# Patient Record
Sex: Male | Born: 1935 | Race: Black or African American | Hispanic: No | Marital: Married | Smoking: Never smoker
Health system: Southern US, Community
[De-identification: ages and names within clinical notes are randomized; demographics above are authoritative.]

## PROBLEM LIST (undated history)

## (undated) DIAGNOSIS — I10 Essential (primary) hypertension: Secondary | ICD-10-CM

## (undated) NOTE — *Deleted (*Deleted)
PMR Admission Coordinator Pre-Admission Assessment   Patient: Tyler Glass is an 70 y.o., male MRN: [id number] DOB: [birth year] Height: 6\' 1"  (185.4 cm) Weight: 83.9 kg                                                                                                                                                  Insurance Information HMO: yes    PPO:      PCP:      IPA:      80/20:      OTHER:  PRIMARY: [employer]      Policy#: [id number]      Subscriber: pt CM Name: ***      Phone#: [phone] ext ***     Fax#: [phone] Pre-Cert#: ***      Employer:  Benefits:  Phone #: [phone]     Name:  Dolores Hoose. Date: ***     Deduct: ***      Out of Pocket Max: ***      Life Max: ***  CIR: ***      SNF: *** Outpatient: ***     Co-Pay: *** Home Health: ***      Co-Pay: *** DME: ***     Co-Pay: *** Providers: *** SECONDARY:       Policy#:       Phone#:    Financial Counselor:       Phone#:    The "Data Collection Information Summary" for patients in Inpatient Rehabilitation Facilities with attached "Privacy Act Statement-Health Care Records" was provided and verbally reviewed with: Patient and Family   Emergency Contact Information         Contact Information     Name Relation Home Work Mobile    [name] Daughter     [phone]       Current Medical History  Patient Admitting Diagnosis: L lateral medullary CVA (Wallenberg syndrome)   History of Present Illness: Tyler Glass is a 89 y.o. right-handed male with history of hypertension.  He [facility] to [facility]2020 with right hemiparesis and dysarthria. Cranial CT unremarkable for acute intracranial process. Patient did not receive TPA.  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 Transfer to [facility] for further workup.  MRA angiography does not show significant carotid bifurcation disease.  Admission chemistries glucose 172, creatinine 1.44, alcohol negative,  urine drug screen negative, hemoglobin A1c 6.0.  Echocardiogram with EF of 60-65%, no wall motion abnormalities grade 1 diastolic dysfunction.  Currently maintained on aspirin and Plavix for CVA prophylaxis x3 weeks then aspirin alone.  Subcutaneous Lovenox for DVT prophylaxis.  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  Started on dysphagia #3 nectar thick liquids.  Therapy evaluations completed with recommendations of CIR.   Complete NIHSS TOTAL: 4   Past Medical History  Past Medical History:  Diagnosis Date  . Hypertension        Family History  family history is not on file.   Prior Rehab/Hospitalizations:  Has the patient had prior rehab or hospitalizations prior to admission? No; presented to [facility] hospital in [facility] prior to [facility], but does not appear to have been admitted.    Has the patient had major surgery during 100 days prior to admission? No   Current Medications    Current Facility-Administered Medications:  .  acetaminophen (TYLENOL) tablet 650 mg, 650 mg, Oral, Q4H PRN **OR** acetaminophen (TYLENOL) 160 MG/5ML solution 650 mg, 650 mg, Per Tube, Q4H PRN **OR** acetaminophen (TYLENOL) suppository 650 mg, 650 mg, Rectal, Q4H PRN, Lewie Chamber, MD .  aspirin EC tablet 81 mg, 81 mg, Oral, Daily, [name], [name], NP, 81 mg at 04/30/20 0915 .  atorvastatin (LIPITOR) tablet 80 mg, 80 mg, Oral, Daily, [name], MD, 80 mg at 04/30/20 0913 .  chlorproMAZINE (THORAZINE) tablet 12.5 mg, 12.5 mg, Oral, TID, [name], [name], NP, 12.5 mg at 04/30/20 0914 .  clopidogrel (PLAVIX) tablet 75 mg, 75 mg, Oral, Daily, Erick Blinks, MD, 75 mg at 04/30/20 0914 .  enoxaparin (LOVENOX) injection 40 mg, 40 mg, Subcutaneous, Daily, Lewie Chamber, MD, 40 mg at 04/30/20 0913 .  hydrALAZINE (APRESOLINE) injection 10 mg, 10 mg, Intravenous, Q4H PRN, Lewie Chamber, MD .  Melene Muller ON 05/01/2020] influenza vaccine adjuvanted (FLUAD) injection 0.5 mL, 0.5 mL, Intramuscular,  Tomorrow-1000, Singh, Prashant K, MD .  labetalol (NORMODYNE) injection 10 mg, 10 mg, Intravenous, Q4H PRN, Lewie Chamber, MD .  lactated ringers infusion, , Intravenous, Continuous, [name], MD, Last Rate: 100 mL/hr at 04/30/20 0930, New Bag at 04/30/20 0930 .  MEDLINE mouth rinse, 15 mL, Mouth Rinse, BID, [name], MD, 15 mL at 04/30/20 0931 .  pantoprazole (PROTONIX) EC tablet 40 mg, 40 mg, Oral, Daily, [name], MD, 40 mg at 04/30/20 0914 .  Resource ThickenUp Clear, , Oral, PRN, [name], MD   Patients Current Diet:     Diet Order                      DIET DYS 3 Room service appropriate? Yes; Fluid consistency: Nectar Thick  Diet effective now                      Precautions / Restrictions Precautions Precautions: Fall Precaution Comments: pt denies falling prior to or since CVA Restrictions Weight Bearing Restrictions: No    Has the patient had 2 or more falls or a fall with injury in the past year?No   Prior Activity Level Community (5-7x/wk): fully independent, driving, working in the yard, no DME used prior to admission (lived independently in [place])   Prior Functional Level Prior Function Level of Independence: Independent Comments: lived alone in [place]; driving, grocery shopping, yard work; daughter brought him to [facility] after discharged by [place]   Self Care: Did the patient need help bathing, dressing, using the toilet or eating?  Independent   Indoor Mobility: Did the patient need assistance with walking from room to room (with or without device)? Independent   Stairs: Did the patient need assistance with internal or external stairs (with or without device)? 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 None of the above   Current Functional Level Cognition   Overall Cognitive Status: Within Functional Limits for tasks assessed Orientation Level: Oriented to person, Oriented to place, Oriented to time, Disoriented to time General Comments: o x4    Extremity Assessment (includes Sensation/Coordination)   Upper Extremity Assessment: RUE deficits/detail RUE Deficits / Details: grossly 3/5 strength, movements very ataxic from shoulder RUE Sensation: decreased light touch, decreased proprioception RUE Coordination: decreased fine motor, decreased gross motor  Lower Extremity Assessment: RLE deficits/detail RLE Deficits / Details: limited assessment with lesser ataxia than RUE noted; strength grossly 4/5 RLE Sensation:  (stated RLE some numbness (?accuracy)) RLE Coordination: decreased gross motor     ADLs   Overall ADL's : Needs assistance/impaired Eating/Feeding: Sitting, Moderate assistance Eating/Feeding Details (indicate cue type and reason): will attempt modified utensils - balancing weight with increased handles Grooming: Moderate assistance, Sitting Grooming Details (indicate cue type and reason): RUE (dominant) ataxic and overall weak impacting ability to interact and use functional ADL tools - during oral care wash cloth used to build up handle of toothbrush and assist to guide to mouth Upper Body Bathing: Moderate assistance Lower Body Bathing: Moderate assistance Upper Body Dressing : Moderate assistance Lower Body Dressing: Moderate assistance, Maximal assistance, Sit to/from stand Toilet Transfer: Maximal assistance, Tax adviser Details (indicate cue type and reason): face to face transfer using gait belt, asisst for body position in space and boost to turn Toileting- Clothing Manipulation and Hygiene: Maximal assistance Functional mobility during ADLs: Moderate assistance, Maximal assistance (SPT  only this session, for true ambulation will require +2) General ADL Comments: Pt very motivated and eager for therapy     Mobility   Overal bed mobility: Needs Assistance Bed Mobility: Rolling, Sidelying to Sit Rolling: Supervision Sidelying to sit: Mod assist Sit to sidelying: Min assist General bed mobility comments: coming up from rt side required incr assist to achieve midline (leaning rt)     Transfers   Overall transfer level: Needs assistance Equipment used: 1 person hand held assist Transfers: Sit to/from Stand, Stand Pivot Transfers Sit to Stand: Mod assist Stand pivot transfers: Max assist  Lateral/Scoot Transfers: Min assist General transfer comment: use of gait belt and verbal cues for lean and face to face mod to max boost to stand, using "dancing/rocking" to pivot to recliner     Ambulation / Gait / Stairs / Wheelchair Mobility   Ambulation/Gait General Gait Details: unable; will need +2 assist     Posture / Balance Dynamic Sitting Balance Sitting balance - Comments: rt lean; with time able to maintain midline with LUE support Balance Overall balance assessment: Needs assistance Sitting-balance support: Bilateral upper extremity supported, Feet supported Sitting balance-Leahy Scale: Poor Sitting balance - Comments: rt lean; with time able to maintain midline with LUE support Postural control: Right lateral lean Standing balance support: No upper extremity supported Standing balance-Leahy Scale: Zero Standing balance comment: rt lean     Special needs/care consideration Designated visitor daughter, Eulas Post        Previous Home Environment (from acute therapy documentation) Living Arrangements: Children (with daughter (she brought him from [facility])) Available Help at Discharge: Family (daughter) Type of Home: House Home Layout: Two level, 1/2 bath on main level (pt stayed on 1st floor ) Alternate Level Stairs-Number of Steps: flight Home Access: Stairs to enter  Entrance Stairs-Number of Steps: 1 (  threshold) Bathroom Shower/Tub: Other (comment) (upstairs in dtr's home; pt unsure type) Home Care Services: No   Discharge Living Setting Plans for Discharge Living Setting: Lives with (comment) (daughter Eulas Post (temporarily since CVA)) Type of Home at Discharge: House Discharge Home Layout: Two level, 1/2 bath on main level (pt has been staying on first floor) Alternate Level Stairs-Number of Steps: full flight Discharge Home Access: Stairs to enter Entrance Stairs-Rails: None Entrance Stairs-Number of Steps: 1 Discharge Bathroom Shower/Tub: Tub/shower unit Discharge Bathroom Toilet: Standard Discharge Bathroom Accessibility: Yes How Accessible: Accessible via walker Does the patient have any problems obtaining your medications?: No   Social/Family/Support Systems Patient Roles: Parent Anticipated Caregiver: Microbiologist Information: [phone] Ability/Limitations of 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  Felt he was having stroke like symptoms, so went to the [place] hospital in [facility] where his daughter met him.  He was discharged from the [facility] hospital with symptoms seemingly worsening.  Returned to [facility] with Aliya and went to [facility] and was transferred to [facility].  Pt/Family Agrees to Admission and willing to participate: Yes Program Orientation Provided & Reviewed with Pt/Caregiver Including Roles  & Responsibilities: Yes  Barriers to Discharge: Insurance for SNF coverage     Decrease burden of Care through IP rehab admission:  n/a   Possible need for SNF placement upon discharge: Not anticipated.  Good family support.    Patient Condition: This patient's condition remains as documented in the consult dated 04/29/2020, in which the Rehabilitation Physician determined and documented that the patient's condition is appropriate for intensive rehabilitative care in an inpatient rehabilitation facility. Will admit to inpatient rehab pending insurance authorization ***.   Preadmission Screen Completed By:  Stephania Fragmin, PT, DPT 04/30/2020 12:25 PM ______________________________________________________________________

---

## 2020-04-27 ENCOUNTER — Emergency Department (HOSPITAL_BASED_OUTPATIENT_CLINIC_OR_DEPARTMENT_OTHER): Payer: Medicare HMO

## 2020-04-27 ENCOUNTER — Other Ambulatory Visit: Payer: Self-pay

## 2020-04-27 ENCOUNTER — Encounter (HOSPITAL_BASED_OUTPATIENT_CLINIC_OR_DEPARTMENT_OTHER): Payer: Self-pay

## 2020-04-27 ENCOUNTER — Inpatient Hospital Stay (HOSPITAL_BASED_OUTPATIENT_CLINIC_OR_DEPARTMENT_OTHER)
Admission: EM | Admit: 2020-04-27 | Discharge: 2020-05-01 | DRG: 065 | Disposition: A | Discharge status: Rehabilitation Facility | Payer: Medicare HMO | Attending: Internal Medicine | Admitting: Internal Medicine

## 2020-04-27 DIAGNOSIS — I69391 Dysphagia following cerebral infarction: Secondary | ICD-10-CM

## 2020-04-27 DIAGNOSIS — I10 Essential (primary) hypertension: Secondary | ICD-10-CM

## 2020-04-27 DIAGNOSIS — N1831 Chronic kidney disease, stage 3a: Secondary | ICD-10-CM | POA: Diagnosis present

## 2020-04-27 DIAGNOSIS — R270 Ataxia, unspecified: Principal | ICD-10-CM | POA: Diagnosis present

## 2020-04-27 DIAGNOSIS — E538 Deficiency of other specified B group vitamins: Secondary | ICD-10-CM | POA: Diagnosis present

## 2020-04-27 DIAGNOSIS — G8191 Hemiplegia, unspecified affecting right dominant side: Secondary | ICD-10-CM | POA: Diagnosis present

## 2020-04-27 DIAGNOSIS — Z79899 Other long term (current) drug therapy: Secondary | ICD-10-CM

## 2020-04-27 DIAGNOSIS — R066 Hiccough: Secondary | ICD-10-CM | POA: Diagnosis present

## 2020-04-27 DIAGNOSIS — I639 Cerebral infarction, unspecified: Secondary | ICD-10-CM

## 2020-04-27 DIAGNOSIS — R29703 NIHSS score 3: Secondary | ICD-10-CM | POA: Diagnosis present

## 2020-04-27 DIAGNOSIS — E7849 Other hyperlipidemia: Secondary | ICD-10-CM | POA: Diagnosis present

## 2020-04-27 DIAGNOSIS — R29810 Facial weakness: Secondary | ICD-10-CM | POA: Diagnosis present

## 2020-04-27 DIAGNOSIS — R278 Other lack of coordination: Secondary | ICD-10-CM | POA: Diagnosis present

## 2020-04-27 DIAGNOSIS — I129 Hypertensive chronic kidney disease with stage 1 through stage 4 chronic kidney disease, or unspecified chronic kidney disease: Secondary | ICD-10-CM | POA: Diagnosis present

## 2020-04-27 DIAGNOSIS — I6389 Other cerebral infarction: Secondary | ICD-10-CM | POA: Diagnosis not present

## 2020-04-27 DIAGNOSIS — Z7289 Other problems related to lifestyle: Secondary | ICD-10-CM

## 2020-04-27 DIAGNOSIS — R4701 Aphasia: Secondary | ICD-10-CM | POA: Diagnosis present

## 2020-04-27 DIAGNOSIS — Z20822 Contact with and (suspected) exposure to covid-19: Secondary | ICD-10-CM | POA: Diagnosis present

## 2020-04-27 DIAGNOSIS — R7303 Prediabetes: Secondary | ICD-10-CM | POA: Diagnosis present

## 2020-04-27 DIAGNOSIS — N179 Acute kidney failure, unspecified: Secondary | ICD-10-CM | POA: Diagnosis present

## 2020-04-27 DIAGNOSIS — R1312 Dysphagia, oropharyngeal phase: Secondary | ICD-10-CM | POA: Diagnosis present

## 2020-04-27 DIAGNOSIS — R471 Dysarthria and anarthria: Secondary | ICD-10-CM | POA: Diagnosis present

## 2020-04-27 HISTORY — DX: Essential (primary) hypertension: I10

## 2020-04-27 LAB — ETHANOL: Alcohol, Ethyl (B): <10 mg/dL (ref <10)

## 2020-04-27 LAB — RAPID URINE DRUG SCREEN, HOSP PERFORMED
Amphetamines: NOT DETECTED
Barbiturates: NOT DETECTED
Benzodiazepines: NOT DETECTED
Cocaine: NOT DETECTED
Opiates: NOT DETECTED
Tetrahydrocannabinol: NOT DETECTED

## 2020-04-27 LAB — CBC
HCT: 43.6 % (ref 39.0–52.0)
Hemoglobin: 14.2 g/dL (ref 13.0–17.0)
MCH: 28.7 pg (ref 26.0–34.0)
MCHC: 32.6 g/dL (ref 30.0–36.0)
MCV: 88.1 fL (ref 80.0–100.0)
Platelets: 312 10*3/uL (ref 150–400)
RBC: 4.95 MIL/uL (ref 4.22–5.81)
RDW: 14 % (ref 11.5–15.5)
WBC: 10 10*3/uL (ref 4.0–10.5)
nRBC: 0 % (ref 0.0–0.2)

## 2020-04-27 LAB — DIFFERENTIAL
Abs Immature Granulocytes: 0.04 10*3/uL (ref 0.00–0.07)
Basophils Absolute: 0 10*3/uL (ref 0.0–0.1)
Basophils Relative: 0 %
Eosinophils Absolute: 0.1 10*3/uL (ref 0.0–0.5)
Eosinophils Relative: 1 %
Immature Granulocytes: 0 %
Lymphocytes Relative: 21 %
Lymphs Abs: 2.1 10*3/uL (ref 0.7–4.0)
Monocytes Absolute: 0.7 10*3/uL (ref 0.1–1.0)
Monocytes Relative: 7 %
Neutro Abs: 6.9 10*3/uL (ref 1.7–7.7)
Neutrophils Relative %: 71 %

## 2020-04-27 LAB — COMPREHENSIVE METABOLIC PANEL
ALT: 22 U/L (ref 0–44)
AST: 34 U/L (ref 15–41)
Albumin: 4 g/dL (ref 3.5–5.0)
Alkaline Phosphatase: 62 U/L (ref 38–126)
Anion gap: 12 (ref 5–15)
BUN: 15 mg/dL (ref 8–23)
CO2: 28 mmol/L (ref 22–32)
Calcium: 9.7 mg/dL (ref 8.9–10.3)
Chloride: 99 mmol/L (ref 98–111)
Creatinine, Ser: 1.44 mg/dL — ABNORMAL HIGH (ref 0.61–1.24)
GFR, Estimated: 48 mL/min — ABNORMAL LOW (ref >60)
Glucose, Bld: 172 mg/dL — ABNORMAL HIGH (ref 70–99)
Potassium: 3.6 mmol/L (ref 3.5–5.1)
Sodium: 139 mmol/L (ref 135–145)
Total Bilirubin: 0.7 mg/dL (ref 0.3–1.2)
Total Protein: 7.5 g/dL (ref 6.5–8.1)

## 2020-04-27 LAB — URINALYSIS, ROUTINE W REFLEX MICROSCOPIC
Bilirubin Urine: NEGATIVE
Glucose, UA: NEGATIVE mg/dL
Hgb urine dipstick: NEGATIVE
Ketones, ur: NEGATIVE mg/dL
Leukocytes,Ua: NEGATIVE
Nitrite: NEGATIVE
Protein, ur: NEGATIVE mg/dL
Specific Gravity, Urine: 1.015 (ref 1.005–1.030)
pH: 7 (ref 5.0–8.0)

## 2020-04-27 LAB — APTT: aPTT: 28 seconds (ref 24–36)

## 2020-04-27 LAB — PROTIME-INR
INR: 1 (ref 0.8–1.2)
Prothrombin Time: 12.6 seconds (ref 11.4–15.2)

## 2020-04-27 LAB — RESPIRATORY PANEL BY RT PCR (FLU A&B, COVID)
Influenza A by PCR: NEGATIVE
Influenza B by PCR: NEGATIVE
SARS Coronavirus 2 by RT PCR: NEGATIVE

## 2020-04-27 NOTE — ED Triage Notes (Signed)
Pt states he went to bed 11/5 ~7pm- he woke 11/6 ~7am unable to walk and difficulty swallowing-pt lives in [place] and went to the [place] there ~8am on 11/6-states he was dx with "slight stroke"-wife states pt's daughter brought pt to area yesterday-pt states he feels same as when sx started 11/6-NAD-to triage in w/c-answering all ?s appropriately-wife with pt

## 2020-04-27 NOTE — ED Provider Notes (Signed)
[facility] EMERGENCY DEPARTMENT Provider Note   CSN: [id number] Arrival date & time: 04/27/20  1340     History Chief Complaint  Patient presents with  . Cerebrovascular Accident    Tyler Glass is a 86 y.o. male.  He has a history of hypertension and lives alone in [place].  3 days ago when he woke up he said he could not walk.  He went to the [place] there were they did a work-up and told him he had a slight stroke.  They sent him home even though he could not walk and family from [place] went and got him and brought him to [place] area.  He continues to have symptoms today.  Denies any headache blurry vision double vision numbness or weakness.  Does feel dizzy and unbalanced.  The history is provided by the patient.  Cerebrovascular Accident This is a new problem. The current episode started more than 2 days ago. The problem occurs constantly. The problem has not changed since onset.Pertinent negatives include no chest pain, no abdominal pain, no headaches and no shortness of breath. The symptoms are aggravated by standing. Nothing relieves the symptoms. He has tried nothing for the symptoms. The treatment provided no relief.       Past Medical History:  Diagnosis Date  . Hypertension     There are no problems to display for this patient.   History reviewed. No pertinent surgical history.     No family history on file.  Social History   Tobacco Use  . Smoking status: Never Smoker  . Smokeless tobacco: Never Used  Vaping Use  . Vaping Use: Never used  Substance Use Topics  . Alcohol use: Yes    Comment: occ  . Drug use: Not on file    Home Medications Prior to Admission medications   Not on File    Allergies    Patient has no known allergies.  Review of Systems   Review of Systems  Constitutional: Negative for fever.  HENT: Negative for sore throat.   Eyes: Negative for visual disturbance.  Respiratory: Negative for shortness of  breath.   Cardiovascular: Negative for chest pain.  Gastrointestinal: Negative for abdominal pain.  Genitourinary: Negative for dysuria.  Musculoskeletal: Positive for gait problem.  Skin: Negative for rash.  Neurological: Negative for headaches.    Physical Exam Updated Vital Signs BP (!) 144/88 (BP Location: Left Arm)   Pulse 98   Temp 98.5 F (36.9 C) (Oral)   Resp 20   Ht 6\' 1"  (1.854 m)   Wt 83.9 kg   SpO2 98%   BMI 24.41 kg/m   Physical Exam Vitals and nursing note reviewed.  Constitutional:      Appearance: Normal appearance. He is well-developed.  HENT:     Head: Normocephalic and atraumatic.  Eyes:     Conjunctiva/sclera: Conjunctivae normal.  Cardiovascular:     Rate and Rhythm: Normal rate and regular rhythm.     Pulses: Normal pulses.     Heart sounds: No murmur heard.   Pulmonary:     Effort: Pulmonary effort is normal. No respiratory distress.     Breath sounds: Normal breath sounds.  Abdominal:     Palpations: Abdomen is soft.     Tenderness: There is no abdominal tenderness.  Musculoskeletal:        General: No deformity. Normal range of motion.     Cervical back: Neck supple.     Right lower leg:  No edema.     Left lower leg: No edema.  Skin:    General: Skin is warm and dry.  Neurological:     General: No focal deficit present.     Mental Status: He is alert.     Cranial Nerves: Cranial nerve deficit present.     Sensory: No sensory deficit.     Motor: No weakness.     Gait: Gait abnormal.     Comments: Patient needed maximal assist to even stand at the side of the bed much less take a step.  Leaning towards the right.  Slight droop of right eyelid     ED Results / Procedures / Treatments   Labs (all labs ordered are listed, but only abnormal results are displayed) Labs Reviewed  COMPREHENSIVE METABOLIC PANEL - Abnormal; Notable for the following components:      Result Value   Glucose, Bld 172 (*)    Creatinine, Ser 1.44 (*)     GFR, Estimated 48 (*)    All other components within normal limits  RESPIRATORY PANEL BY RT PCR (FLU A&B, COVID)  ETHANOL  PROTIME-INR  APTT  CBC  DIFFERENTIAL  RAPID URINE DRUG SCREEN, HOSP PERFORMED  URINALYSIS, ROUTINE W REFLEX MICROSCOPIC    EKG EKG Interpretation  Date/Time:  Monday April 27 2020 14:07:04 EST Ventricular Rate:  95 PR Interval:    QRS Duration: 84 QT Interval:  342 QTC Calculation: 430 R Axis:   48 Text Interpretation: Sinus rhythm Borderline repolarization abnormality No old tracing to compare Confirmed by Meridee Score [phone]) on 04/27/2020 2:30:36 PM   Radiology CT HEAD WO CONTRAST  Result Date: 04/27/2020 CLINICAL DATA:  Neuro deficit, acute stroke suspected. EXAM: CT HEAD WITHOUT CONTRAST TECHNIQUE: Contiguous axial images were obtained from the base of the skull through the vertex without intravenous contrast. COMPARISON:  None. FINDINGS: Brain: No evidence of acute large vascular territory infarction, hemorrhage, hydrocephalus, extra-axial collection or mass lesion/mass effect. Scattered white matter hypodensities, likely related to chronic microvascular ischemic disease. Mild generalized cerebral volume loss with ex vacuo ventricular dilation. Vascular: Calcific atherosclerosis. Skull: No acute fracture. Sinuses/Orbits: No acute findings. Other: No mastoid effusions. IMPRESSION: No evidence of acute intracranial abnormality. Electronically Signed   By: Feliberto Harts MD   On: 04/27/2020 14:38    Procedures Procedures (including critical care time)  Medications Ordered in ED Medications - No data to display  ED Course  I have reviewed the triage vital signs and the nursing notes.  Pertinent labs & imaging results that were available during my care of the patient were reviewed by me and considered in my medical decision making (see chart for details).  Clinical Course as of Apr 27 1650  [facility] Apr 27, 2020  1416 Discussed with Dr. Amada Jupiter  teleneurology.  He feels that if the patient had a work-up in the [place] system and he is not safe for discharge then consideration to getting him readmitted at the [place].If this is not possible then he may need to be admitted to [facility] for possible placement.   [MB]  1500 Patient's care is signed out to oncoming provider Dr. Renaye Rakers to follow-up on any information we can get from the [place].  Patient will likely need to be admitted either to the [facility] or to [facility] for further work-up.   [MB]    Clinical Course User Index [MB] Terrilee Files, MD   MDM Rules/Calculators/A&P  This patient complains of weakness right side unable to ambulate this involves an extensive number of treatment Options and is a complaint that carries with it a high risk of complications and Morbidity. The differential includes stroke, bleed, radiculopathy, metabolic derangement, hypoglycemia  I ordered, reviewed and interpreted labs, which included CBC with normal white count normal hemoglobin, chemistries fairly normal other than mild elevation in creatinine and glucose, unclear baseline, Covid testing negative  I ordered imaging studies which included CT head and I independently    visualized and interpreted imaging which showed no acute findings Additional history obtained from patient's wife Previous records obtained and reviewed in epic no recent visits, records requested from [place] I consulted teleneurology Dr. Amada Jupiter and discussed lab and imaging findings  Critical Interventions: None  After the interventions stated above, I reevaluated the patient and found patient still to be symptomatic and unsafe for discharge.  He was signed out to Dr. Redmond School and to follow-up on results from [place] and likely will need admission to the hospital for further work-up of likely strokelike symptoms.   Final Clinical Impression(s) / ED Diagnoses Final diagnoses:  Ataxia    Rx / DC Orders ED Discharge Orders    None         Terrilee Files, MD 04/27/20 1655

## 2020-04-27 NOTE — ED Provider Notes (Signed)
86 yo male presenting with inability to ambulate for 3-4 days, listing to the right side when standing, also reporting hiccups for several days.  Papers faxed from [facility] hospital in [facility] - CT Head normal, but no MRI shown here.  I spoke to Dr Thomasena Edis from neurology who recommended medical admission for MRI brain and MRA head and neck to evaluate specifically for brainstem lesion.  He notes that the patient's "listing to the side" and hiccups can be symptoms of a brainstem lesion, which may not have been readily apparent even on an early MRI.    The patient was admitted to Dr. Benjamine Mola the hospitalist at [facility].  Patient and his wife updated and in agreement with plan.   Terald Sleeper, MD 04/28/20 [phone]

## 2020-04-27 NOTE — ED Notes (Signed)
Patient coughed after swallow screen and stated that his throat hurts.  He stated that his throat is burning.

## 2020-04-28 ENCOUNTER — Inpatient Hospital Stay (HOSPITAL_COMMUNITY): Payer: Medicare HMO

## 2020-04-28 DIAGNOSIS — Z79899 Other long term (current) drug therapy: Secondary | ICD-10-CM | POA: Diagnosis not present

## 2020-04-28 DIAGNOSIS — I69351 Hemiplegia and hemiparesis following cerebral infarction affecting right dominant side: Secondary | ICD-10-CM | POA: Diagnosis not present

## 2020-04-28 DIAGNOSIS — E46 Unspecified protein-calorie malnutrition: Secondary | ICD-10-CM | POA: Diagnosis not present

## 2020-04-28 DIAGNOSIS — E7849 Other hyperlipidemia: Secondary | ICD-10-CM | POA: Diagnosis present

## 2020-04-28 DIAGNOSIS — R7303 Prediabetes: Secondary | ICD-10-CM | POA: Diagnosis present

## 2020-04-28 DIAGNOSIS — E876 Hypokalemia: Secondary | ICD-10-CM | POA: Diagnosis not present

## 2020-04-28 DIAGNOSIS — R7401 Elevation of levels of liver transaminase levels: Secondary | ICD-10-CM | POA: Diagnosis not present

## 2020-04-28 DIAGNOSIS — I159 Secondary hypertension, unspecified: Secondary | ICD-10-CM | POA: Diagnosis not present

## 2020-04-28 DIAGNOSIS — Z20822 Contact with and (suspected) exposure to covid-19: Secondary | ICD-10-CM | POA: Diagnosis present

## 2020-04-28 DIAGNOSIS — E538 Deficiency of other specified B group vitamins: Secondary | ICD-10-CM | POA: Diagnosis present

## 2020-04-28 DIAGNOSIS — G463 Brain stem stroke syndrome: Secondary | ICD-10-CM | POA: Diagnosis not present

## 2020-04-28 DIAGNOSIS — Z7289 Other problems related to lifestyle: Secondary | ICD-10-CM | POA: Diagnosis not present

## 2020-04-28 DIAGNOSIS — I69391 Dysphagia following cerebral infarction: Secondary | ICD-10-CM | POA: Diagnosis not present

## 2020-04-28 DIAGNOSIS — G8929 Other chronic pain: Secondary | ICD-10-CM | POA: Diagnosis not present

## 2020-04-28 DIAGNOSIS — I82442 Acute embolism and thrombosis of left tibial vein: Secondary | ICD-10-CM | POA: Diagnosis not present

## 2020-04-28 DIAGNOSIS — R29810 Facial weakness: Secondary | ICD-10-CM | POA: Diagnosis present

## 2020-04-28 DIAGNOSIS — N179 Acute kidney failure, unspecified: Secondary | ICD-10-CM | POA: Diagnosis present

## 2020-04-28 DIAGNOSIS — I6389 Other cerebral infarction: Secondary | ICD-10-CM | POA: Diagnosis present

## 2020-04-28 DIAGNOSIS — E785 Hyperlipidemia, unspecified: Secondary | ICD-10-CM | POA: Diagnosis not present

## 2020-04-28 DIAGNOSIS — R270 Ataxia, unspecified: Secondary | ICD-10-CM | POA: Diagnosis present

## 2020-04-28 DIAGNOSIS — R1312 Dysphagia, oropharyngeal phase: Secondary | ICD-10-CM | POA: Diagnosis present

## 2020-04-28 DIAGNOSIS — E8809 Other disorders of plasma-protein metabolism, not elsewhere classified: Secondary | ICD-10-CM | POA: Diagnosis not present

## 2020-04-28 DIAGNOSIS — Z23 Encounter for immunization: Secondary | ICD-10-CM | POA: Diagnosis present

## 2020-04-28 DIAGNOSIS — R278 Other lack of coordination: Secondary | ICD-10-CM | POA: Diagnosis present

## 2020-04-28 DIAGNOSIS — S76912S Strain of unspecified muscles, fascia and tendons at thigh level, left thigh, sequela: Secondary | ICD-10-CM | POA: Diagnosis not present

## 2020-04-28 DIAGNOSIS — D62 Acute posthemorrhagic anemia: Secondary | ICD-10-CM | POA: Diagnosis not present

## 2020-04-28 DIAGNOSIS — N1831 Chronic kidney disease, stage 3a: Secondary | ICD-10-CM

## 2020-04-28 DIAGNOSIS — N183 Chronic kidney disease, stage 3 unspecified: Secondary | ICD-10-CM | POA: Diagnosis not present

## 2020-04-28 DIAGNOSIS — R4701 Aphasia: Secondary | ICD-10-CM | POA: Diagnosis present

## 2020-04-28 DIAGNOSIS — I129 Hypertensive chronic kidney disease with stage 1 through stage 4 chronic kidney disease, or unspecified chronic kidney disease: Secondary | ICD-10-CM | POA: Diagnosis present

## 2020-04-28 DIAGNOSIS — Z7902 Long term (current) use of antithrombotics/antiplatelets: Secondary | ICD-10-CM | POA: Diagnosis not present

## 2020-04-28 DIAGNOSIS — I10 Essential (primary) hypertension: Secondary | ICD-10-CM

## 2020-04-28 DIAGNOSIS — R29703 NIHSS score 3: Secondary | ICD-10-CM | POA: Diagnosis present

## 2020-04-28 DIAGNOSIS — R066 Hiccough: Secondary | ICD-10-CM | POA: Diagnosis present

## 2020-04-28 DIAGNOSIS — I639 Cerebral infarction, unspecified: Secondary | ICD-10-CM | POA: Diagnosis not present

## 2020-04-28 DIAGNOSIS — I69322 Dysarthria following cerebral infarction: Secondary | ICD-10-CM | POA: Diagnosis not present

## 2020-04-28 DIAGNOSIS — I63541 Cerebral infarction due to unspecified occlusion or stenosis of right cerebellar artery: Secondary | ICD-10-CM | POA: Diagnosis not present

## 2020-04-28 DIAGNOSIS — I6330 Cerebral infarction due to thrombosis of unspecified cerebral artery: Secondary | ICD-10-CM | POA: Diagnosis not present

## 2020-04-28 DIAGNOSIS — I6300 Cerebral infarction due to thrombosis of unspecified precerebral artery: Secondary | ICD-10-CM | POA: Diagnosis not present

## 2020-04-28 DIAGNOSIS — X58XXXA Exposure to other specified factors, initial encounter: Secondary | ICD-10-CM | POA: Diagnosis not present

## 2020-04-28 DIAGNOSIS — S76912A Strain of unspecified muscles, fascia and tendons at thigh level, left thigh, initial encounter: Secondary | ICD-10-CM | POA: Diagnosis not present

## 2020-04-28 DIAGNOSIS — Z7901 Long term (current) use of anticoagulants: Secondary | ICD-10-CM | POA: Diagnosis not present

## 2020-04-28 DIAGNOSIS — R471 Dysarthria and anarthria: Secondary | ICD-10-CM | POA: Diagnosis present

## 2020-04-28 DIAGNOSIS — M25561 Pain in right knee: Secondary | ICD-10-CM | POA: Diagnosis not present

## 2020-04-28 DIAGNOSIS — G8191 Hemiplegia, unspecified affecting right dominant side: Secondary | ICD-10-CM | POA: Diagnosis present

## 2020-04-28 LAB — CBC WITH DIFFERENTIAL/PLATELET
Abs Immature Granulocytes: 0.02 10*3/uL (ref 0.00–0.07)
Basophils Absolute: 0.1 10*3/uL (ref 0.0–0.1)
Basophils Relative: 1 %
Eosinophils Absolute: 0.3 10*3/uL (ref 0.0–0.5)
Eosinophils Relative: 3 %
HCT: 44.9 % (ref 39.0–52.0)
Hemoglobin: 14.8 g/dL (ref 13.0–17.0)
Immature Granulocytes: 0 %
Lymphocytes Relative: 21 %
Lymphs Abs: 1.6 10*3/uL (ref 0.7–4.0)
MCH: 29.7 pg (ref 26.0–34.0)
MCHC: 33 g/dL (ref 30.0–36.0)
MCV: 90 fL (ref 80.0–100.0)
Monocytes Absolute: 0.7 10*3/uL (ref 0.1–1.0)
Monocytes Relative: 10 %
Neutro Abs: 4.7 10*3/uL (ref 1.7–7.7)
Neutrophils Relative %: 65 %
Platelets: 289 10*3/uL (ref 150–400)
RBC: 4.99 MIL/uL (ref 4.22–5.81)
RDW: 13.8 % (ref 11.5–15.5)
WBC: 7.3 10*3/uL (ref 4.0–10.5)
nRBC: 0 % (ref 0.0–0.2)

## 2020-04-28 LAB — COMPREHENSIVE METABOLIC PANEL
ALT: 29 U/L (ref 0–44)
AST: 36 U/L (ref 15–41)
Albumin: 3.8 g/dL (ref 3.5–5.0)
Alkaline Phosphatase: 69 U/L (ref 38–126)
Anion gap: 14 (ref 5–15)
BUN: 16 mg/dL (ref 8–23)
CO2: 26 mmol/L (ref 22–32)
Calcium: 9.6 mg/dL (ref 8.9–10.3)
Chloride: 101 mmol/L (ref 98–111)
Creatinine, Ser: 1.32 mg/dL — ABNORMAL HIGH (ref 0.61–1.24)
GFR, Estimated: 53 mL/min — ABNORMAL LOW (ref >60)
Glucose, Bld: 111 mg/dL — ABNORMAL HIGH (ref 70–99)
Potassium: 3.9 mmol/L (ref 3.5–5.1)
Sodium: 141 mmol/L (ref 135–145)
Total Bilirubin: 1.1 mg/dL (ref 0.3–1.2)
Total Protein: 7.1 g/dL (ref 6.5–8.1)

## 2020-04-28 LAB — LIPID PANEL
Cholesterol: 196 mg/dL (ref 0–200)
HDL: 58 mg/dL (ref >40)
LDL Cholesterol: 118 mg/dL — ABNORMAL HIGH (ref 0–99)
Total CHOL/HDL Ratio: 3.4 RATIO
Triglycerides: 101 mg/dL (ref <150)
VLDL: 20 mg/dL (ref 0–40)

## 2020-04-28 LAB — VITAMIN B12: Vitamin B-12: 132 pg/mL — ABNORMAL LOW (ref 180–914)

## 2020-04-28 LAB — ECHOCARDIOGRAM COMPLETE
Area-P 1/2: 2.73 cm2
Height: 73 in
S' Lateral: 2.3 cm
Weight: 2960 oz

## 2020-04-28 LAB — TSH: TSH: 1.742 u[IU]/mL (ref 0.350–4.500)

## 2020-04-28 LAB — HEMOGLOBIN A1C
Hgb A1c MFr Bld: 6 % — ABNORMAL HIGH (ref 4.8–5.6)
Mean Plasma Glucose: 125.5 mg/dL

## 2020-04-28 LAB — MAGNESIUM: Magnesium: 2.1 mg/dL (ref 1.7–2.4)

## 2020-04-28 MED ORDER — ENOXAPARIN SODIUM 40 MG/0.4ML ~~LOC~~ SOLN
40.0000 mg | Freq: Every day | SUBCUTANEOUS | Status: DC
  Administered 2020-04-28 – 2020-05-01 (×4): 40 mg via SUBCUTANEOUS
  Filled 2020-04-28 (×4): qty 0.4

## 2020-04-28 MED ORDER — ACETAMINOPHEN 325 MG PO TABS: 650.0000 mg | ORAL_TABLET | ORAL | Status: DC | PRN

## 2020-04-28 MED ORDER — STROKE: EARLY STAGES OF RECOVERY BOOK
Freq: Once | Status: AC
  Filled 2020-04-28: qty 1

## 2020-04-28 MED ORDER — LABETALOL HCL 5 MG/ML IV SOLN: 10.0000 mg | INTRAVENOUS | Status: DC | PRN

## 2020-04-28 MED ORDER — HYDRALAZINE HCL 20 MG/ML IJ SOLN: 10.0000 mg | INTRAMUSCULAR | Status: DC | PRN

## 2020-04-28 MED ORDER — GADOBUTROL 1 MMOL/ML IV SOLN
8.3000 mL | Freq: Once | INTRAVENOUS | Status: AC | PRN
  Administered 2020-04-28: 8.3 mL via INTRAVENOUS

## 2020-04-28 MED ORDER — SODIUM CHLORIDE 0.9 % IV SOLN: INTRAVENOUS | Status: DC

## 2020-04-28 MED ORDER — ASPIRIN 300 MG RE SUPP
300.0000 mg | Freq: Every day | RECTAL | Status: DC
  Administered 2020-04-28: 300 mg via RECTAL
  Filled 2020-04-28: qty 1

## 2020-04-28 MED ORDER — ACETAMINOPHEN 160 MG/5ML PO SOLN: 650.0000 mg | ORAL | Status: DC | PRN

## 2020-04-28 MED ORDER — ASPIRIN 325 MG PO TABS
325.0000 mg | ORAL_TABLET | Freq: Every day | ORAL | Status: DC
  Administered 2020-04-29: 325 mg via ORAL
  Filled 2020-04-28: qty 1

## 2020-04-28 MED ORDER — ACETAMINOPHEN 650 MG RE SUPP: 650.0000 mg | RECTAL | Status: DC | PRN

## 2020-04-28 NOTE — ED Notes (Signed)
Called to give report floor RN unavailable. Requested to call back.

## 2020-04-28 NOTE — Hospital Course (Addendum)
Tyler Glass is an 86 yo male with PMH HTN who is transferred from [facility] due to right side weakness/ataxia, change in speech, hiccups, difficulty swallowing.  [facility] when he a[facility] on 11/6 with RLE/RUE weakness and difficulty swallowing. He underwent a workup at the [place] and was told "it did not show anything" however some ER notes state he had a "slight stroke". Unfortunately no [facility] records have been able to be reviewed.  Despite his weakness/gait instability he was discharged home from the [place].  Due to being unable to ambulate safely, hisfamily from [place] drove to [facility] to bring patient back to [facility].   He has continued to have intractable hiccups.  He has not eaten much since Saturday due to being unable to swallow and states that he mostly spits it back up.   His case was discussed with neurology from [facility] and he was recommended for transfer for MRI brain and MRA head/neck to rule out brainstem lesion.  CTH performed at [facility] and is negative for acute abnormalities.

## 2020-04-28 NOTE — Assessment & Plan Note (Signed)
-   ongoing 4/5 RUE/RLE weakness; RUE dysmetria with finger to nose (heel to shin normal in both LE); he has dysarthria and some aphasia; paresthesias present throughout entire right side on sensory exam - neuro aware of patient, appreciate assistance - obtain MRI brain, MRA head/neck - check TSH, A1c, Lipid - echo ordered - PT/OT/SLP - NPO for now - continue asa; no bleed on [facility]

## 2020-04-28 NOTE — Progress Notes (Signed)
PT Cancellation Note  Patient Details Name: Tyler Glass MRN: [id number] DOB: 08-05-1933   Cancelled Treatment:    Reason Eval/Treat Not Completed: Patient at procedure or test/unavailable Pt currently out of room at MRI. Will follow up as schedule allows.   Farley Ly, PT, DPT  Acute Rehabilitation Services  Pager: [phone] Office: [phone]    Lehman Prom 04/28/2020, 11:20 AM

## 2020-04-28 NOTE — Assessment & Plan Note (Signed)
-   suspected CKD for now; no prior labs for comparison  - monitor BMP while in hospital

## 2020-04-28 NOTE — ED Notes (Signed)
[facility] called, report given, [facility] ETA 25 minutes.

## 2020-04-28 NOTE — Progress Notes (Signed)
  Echocardiogram 2D Echocardiogram has been performed.  Celene Skeen 04/28/2020, 1:02 PM

## 2020-04-28 NOTE — H&P (Signed)
History and Physical    Tyler Glass  MOQ:[id number]  DOB: March 19, 1934  DOA: 04/27/2020  PCP: Patient, No Pcp Per Patient coming from: home  Chief Complaint: right side weakness, unsteady gait, difficulty with speech, hiccups, trouble swallowing  HPI:  Tyler Glass is an 86 yo male with PMH HTN who is transferred from [facility] due to right side weakness/ataxia, change in speech, hiccups, difficulty swallowing.  [facility] when he awoke on 11/6 with RLE/RUE weakness and difficulty swallowing. He underwent a workup at the [place] and was told "it did not show anything" however some ER notes state he had a "slight stroke". Unfortunately no [facility] records have been able to be reviewed.  Despite his weakness/gait instability he was discharged home from the [place].  Due to being unable to ambulate safely, hisfamily from  drove to [facility] to bring patient back to [facility].   He has continued to have intractable hiccups.  He has not eaten much since Saturday due to being unable to swallow and states that he mostly spits it back up.   His case was discussed with neurology from [facility] and he was recommended for transfer for MRI brain and MRA head/neck to rule out brainstem lesion.  CTH performed at [facility] and is negative for acute abnormalities.    I have personally briefly reviewed patient's old medical records in [facility] and discussed patient with the ER provider when appropriate/indicated.  Assessment/Plan: * CVA (cerebral vascular accident) (HCC) - ongoing 4/5 RUE/RLE weakness; RUE dysmetria with finger to nose (heel to shin normal in both LE); he has dysarthria and some aphasia; paresthesias present throughout entire right side on sensory exam - neuro aware of patient, appreciate assistance - obtain MRI brain, MRA head/neck - check TSH, A1c, Lipid - echo ordered - PT/OT/SLP - NPO for now - continue asa; no bleed on CTH  Chronic renal failure, stage 3a (HCC) - suspected CKD for  now; no prior labs for comparison  - monitor BMP while in hospital   Hypertension -Symptoms began on 04/25/2020, therefore would be outside permissive hypertension window - use labetalol or hydralazine PRN     Code Status: Full DVT Prophylaxis: Lovenox Anticipated disposition is to: pending PT eval, likely needs rehab  History: Past Medical History:  Diagnosis Date  . Hypertension     History reviewed. No pertinent surgical history.   reports that he has never smoked. He has never used smokeless tobacco. He reports current alcohol use. No history on file for drug use.  No Known Allergies  No family history on file.  Home Medications: Prior to Admission medications   Medication Sig Start Date End Date Taking? Authorizing Provider  amLODipine (NORVASC) 2.5 MG tablet  04/25/20   [provider]  atorvastatin (LIPITOR) 40 MG tablet  01/07/20   [provider]  lisinopril-hydrochlorothiazide (ZESTORETIC) 10-12.5 MG tablet Take 1 tablet by mouth daily. 04/17/20   [provider]    Review of Systems:  Pertinent items noted in HPI and remainder of comprehensive ROS otherwise negative.  Physical Exam: Vitals:   04/28/20 0630 04/28/20 0700 04/28/20 0730 04/28/20 0900  BP: 108/70 102/71 131/81 132/89  Pulse: 84 86 83 82  Resp: 20 (!) 21 18 20   Temp:    97.7 F (36.5 C)  TempSrc:    Oral  SpO2: 100% 95% 99% 96%  Weight:      Height:       General  appearance: pleasant elderly man laying in bed in no distress with ongoing hiccups noted and leaning to right side in bed Head: Normocephalic, without obvious abnormality, atraumatic Eyes: EOMI, PERRL. Accomodation not fully appreciated Lungs: clear to auscultation bilaterally Heart: regular rate and rhythm and S1, S2 normal Abdomen: soft, NT, ND, BS present Extremities: no edema Skin: mobility and turgor normal Neurologic: accomodation impaired in eyes. 4/5 strength in RUE/RLE. Paresthesia in right  V1-V3 and RUE/RLE. Dysmetria in RUE with finger to now. Normal heel to chin in B/L LE. Gait deferred. Romberg deferred for now. Negative babinski   Labs on Admission:  I have personally reviewed following labs and imaging studies Results for orders placed or performed during the hospital encounter of 04/27/20 (from the past 24 hour(s))  Protime-INR     Status: None   Collection Time: 04/27/20  2:24 PM  Result Value Ref Range   Prothrombin Time 12.6 11.4 - 15.2 seconds   INR 1.0 0.8 - 1.2  APTT     Status: None   Collection Time: 04/27/20  2:24 PM  Result Value Ref Range   aPTT 28 24 - 36 seconds  CBC     Status: None   Collection Time: 04/27/20  2:24 PM  Result Value Ref Range   WBC 10.0 4.0 - 10.5 K/uL   RBC 4.95 4.22 - 5.81 MIL/uL   Hemoglobin 14.2 13.0 - 17.0 g/dL   HCT 37.1 39 - 52 %   MCV 88.1 80.0 - 100.0 fL   MCH 28.7 26.0 - 34.0 pg   MCHC 32.6 30.0 - 36.0 g/dL   RDW 69.6 78.9 - 38.1 %   Platelets 312 150 - 400 K/uL   nRBC 0.0 0.0 - 0.2 %  Differential     Status: None   Collection Time: 04/27/20  2:24 PM  Result Value Ref Range   Neutrophils Relative % 71 %   Neutro Abs 6.9 1.7 - 7.7 K/uL   Lymphocytes Relative 21 %   Lymphs Abs 2.1 0.7 - 4.0 K/uL   Monocytes Relative 7 %   Monocytes Absolute 0.7 0.1 - 1.0 K/uL   Eosinophils Relative 1 %   Eosinophils Absolute 0.1 0.0 - 0.5 K/uL   Basophils Relative 0 %   Basophils Absolute 0.0 0.0 - 0.1 K/uL   Immature Granulocytes 0 %   Abs Immature Granulocytes 0.04 0.00 - 0.07 K/uL  Comprehensive metabolic panel     Status: Abnormal   Collection Time: 04/27/20  2:24 PM  Result Value Ref Range   Sodium 139 135 - 145 mmol/L   Potassium 3.6 3.5 - 5.1 mmol/L   Chloride 99 98 - 111 mmol/L   CO2 28 22 - 32 mmol/L   Glucose, Bld 172 (H) 70 - 99 mg/dL   BUN 15 8 - 23 mg/dL   Creatinine, Ser 0.17 (H) 0.61 - 1.24 mg/dL   Calcium 9.7 8.9 - 51.0 mg/dL   Total Protein 7.5 6.5 - 8.1 g/dL   Albumin 4.0 3.5 - 5.0 g/dL   AST 34 15 -  41 U/L   ALT 22 0 - 44 U/L   Alkaline Phosphatase 62 38 - 126 U/L   Total Bilirubin 0.7 0.3 - 1.2 mg/dL   GFR, Estimated 48 (L) >60 mL/min   Anion gap 12 5 - 15  Ethanol     Status: None   Collection Time: 04/27/20  2:34 PM  Result Value Ref Range   Alcohol, Ethyl (B) <10 <10 mg/dL  Urine rapid drug screen (hosp performed)     Status: None   Collection Time: 04/27/20  2:34 PM  Result Value Ref Range   Opiates NONE DETECTED NONE DETECTED   Cocaine NONE DETECTED NONE DETECTED   Benzodiazepines NONE DETECTED NONE DETECTED   Amphetamines NONE DETECTED NONE DETECTED   Tetrahydrocannabinol NONE DETECTED NONE DETECTED   Barbiturates NONE DETECTED NONE DETECTED  Urinalysis, Routine w reflex microscopic Urine, Clean Catch     Status: None   Collection Time: 04/27/20  2:34 PM  Result Value Ref Range   Color, Urine YELLOW YELLOW   APPearance CLEAR CLEAR   Specific Gravity, Urine 1.015 1.005 - 1.030   pH 7.0 5.0 - 8.0   Glucose, UA NEGATIVE NEGATIVE mg/dL   Hgb urine dipstick NEGATIVE NEGATIVE   Bilirubin Urine NEGATIVE NEGATIVE   Ketones, ur NEGATIVE NEGATIVE mg/dL   Protein, ur NEGATIVE NEGATIVE mg/dL   Nitrite NEGATIVE NEGATIVE   Leukocytes,Ua NEGATIVE NEGATIVE  Respiratory Panel by RT PCR (Flu A&B, Covid) - Nasopharyngeal Swab     Status: None   Collection Time: 04/27/20  2:34 PM   Specimen: Nasopharyngeal Swab  Result Value Ref Range   SARS Coronavirus 2 by RT PCR NEGATIVE NEGATIVE   Influenza A by PCR NEGATIVE NEGATIVE   Influenza B by PCR NEGATIVE NEGATIVE     Radiological Exams on Admission: CT HEAD WO CONTRAST  Result Date: 04/27/2020 CLINICAL DATA:  Neuro deficit, acute stroke suspected. EXAM: CT HEAD WITHOUT CONTRAST TECHNIQUE: Contiguous axial images were obtained from the base of the skull through the vertex without intravenous contrast. COMPARISON:  None. FINDINGS: Brain: No evidence of acute large vascular territory infarction, hemorrhage, hydrocephalus, extra-axial  collection or mass lesion/mass effect. Scattered white matter hypodensities, likely related to chronic microvascular ischemic disease. Mild generalized cerebral volume loss with ex vacuo ventricular dilation. Vascular: Calcific atherosclerosis. Skull: No acute fracture. Sinuses/Orbits: No acute findings. Other: No mastoid effusions. IMPRESSION: No evidence of acute intracranial abnormality. Electronically Signed   By: Feliberto Harts MD   On: 04/27/2020 14:38   CT HEAD WO CONTRAST  Final Result    MR BRAIN WO CONTRAST    (Results Pending)  MR ANGIO HEAD WO CONTRAST    (Results Pending)  MR ANGIO NECK W WO CONTRAST    (Results Pending)    Consults called:  Neuro  EKG: Independently reviewed. NSR   Lewie Chamber, MD [facility] 04/28/2020, 10:23 AM

## 2020-04-28 NOTE — Assessment & Plan Note (Signed)
-  Symptoms began on 04/25/2020, therefore would be outside permissive hypertension window - use labetalol or hydralazine PRN

## 2020-04-28 NOTE — Evaluation (Signed)
Clinical/Bedside Swallow Evaluation Patient Details  Name: Tyler Glass MRN: [id number] Date of Birth: [birth year]  Today's Date: 04/28/2020 Time: SLP Start Time (ACUTE ONLY): 1351 SLP Stop Time (ACUTE ONLY): 1405 SLP Time Calculation (min) (ACUTE ONLY): 14 min  Past Medical History:  Past Medical History:  Diagnosis Date   Hypertension    Past Surgical History: History reviewed. No pertinent surgical history. HPI:  Tyler Glass is an 86 yo male with PMH HTN who is transferred from [facility] due to right side weakness/ataxia, change in speech, hiccups, difficulty swallowing.    Assessment / Plan / Recommendation Clinical Impression  Pt presents with concern for pharyngoesophageal dysfunction with likely UES dysfunction s/p acute right medullary infarct. Pt expectorating saliva orally and notes acute dysphagia onset including complaints of "it won't go down". Laryngeal elevation appeared slightly reduced per palpation. Pt with some baseline wet vocal quality. During PO ice chips and thin liquid trials pt with frequent throat clearing and coughing concerning for reduced airway protection. Recommend continue NPO and proceed with instrumental assessment. MBSS to be completed next date.    SLP Visit Diagnosis: Dysphagia, unspecified (R13.10)    Aspiration Risk  Moderate aspiration risk;Risk for inadequate nutrition/hydration    Diet Recommendation  NPO, MBSS 11/10   Medication Administration: Via alternative means    Other  Recommendations Oral Care Recommendations: Oral care QID   Follow up Recommendations        Frequency and Duration min 2x/week  2 weeks       Prognosis Prognosis for Safe Diet Advancement: Good Barriers to Reach Goals: Time post onset      Swallow Study   General Date of Onset: 04/25/20 HPI: Tyler Glass is an 86 yo male with PMH HTN who is transferred from [facility] due to right side weakness/ataxia, change in speech, hiccups, difficulty swallowing.  Type of Study:  Bedside Swallow Evaluation Previous Swallow Assessment: none on file Diet Prior to this Study: NPO Temperature Spikes Noted: No Respiratory Status: Room air History of Recent Intubation: No Behavior/Cognition: Alert;Cooperative;Pleasant mood Oral Cavity Assessment: Within Functional Limits Oral Care Completed by SLP: No Oral Cavity - Dentition: Dentures, top;Adequate natural dentition Vision: Functional for self-feeding Self-Feeding Abilities: Needs assist Patient Positioning: Upright in bed Baseline Vocal Quality: Wet;Low vocal intensity Volitional Cough: Weak Volitional Swallow: Able to elicit    Oral/Motor/Sensory Function Overall Oral Motor/Sensory Function: Mild impairment Facial ROM: Reduced right Facial Strength: Reduced right Facial Sensation: Reduced right Velum: Impaired right   Ice Chips Ice chips: Impaired Presentation: Spoon Oral Phase Functional Implications: Prolonged oral transit Pharyngeal Phase Impairments: Suspected delayed Swallow;Multiple swallows;Throat Clearing - Delayed;Throat Clearing - Immediate   Thin Liquid Thin Liquid: Impaired Presentation: Cup Oral Phase Functional Implications: Prolonged oral transit Pharyngeal  Phase Impairments: Suspected delayed Swallow;Multiple swallows;Cough - Delayed;Throat Clearing - Delayed;Throat Clearing - Immediate;Wet Vocal Quality;Decreased hyoid-laryngeal movement    Nectar Thick Nectar Thick Liquid: Not tested   Honey Thick Honey Thick Liquid: Not tested   Puree Puree: Not tested   Solid     Solid: Not tested      Tyler Glass E Tyler Welford MA, CCC-SLP Acute Rehabilitation Services 04/28/2020,2:16 PM

## 2020-04-29 ENCOUNTER — Inpatient Hospital Stay (HOSPITAL_COMMUNITY): Payer: Medicare HMO

## 2020-04-29 DIAGNOSIS — E538 Deficiency of other specified B group vitamins: Secondary | ICD-10-CM

## 2020-04-29 DIAGNOSIS — E7849 Other hyperlipidemia: Secondary | ICD-10-CM

## 2020-04-29 DIAGNOSIS — I6300 Cerebral infarction due to thrombosis of unspecified precerebral artery: Secondary | ICD-10-CM

## 2020-04-29 DIAGNOSIS — G463 Brain stem stroke syndrome: Secondary | ICD-10-CM

## 2020-04-29 DIAGNOSIS — N1831 Chronic kidney disease, stage 3a: Secondary | ICD-10-CM

## 2020-04-29 DIAGNOSIS — I639 Cerebral infarction, unspecified: Secondary | ICD-10-CM

## 2020-04-29 DIAGNOSIS — I159 Secondary hypertension, unspecified: Secondary | ICD-10-CM

## 2020-04-29 DIAGNOSIS — I10 Essential (primary) hypertension: Secondary | ICD-10-CM

## 2020-04-29 MED ORDER — CLOPIDOGREL BISULFATE 75 MG PO TABS
75.0000 mg | ORAL_TABLET | Freq: Every day | ORAL | Status: DC
  Administered 2020-04-29 – 2020-05-01 (×3): 75 mg via ORAL
  Filled 2020-04-29 (×3): qty 1

## 2020-04-29 MED ORDER — CHLORPROMAZINE HCL 25 MG PO TABS
12.5000 mg | ORAL_TABLET | Freq: Three times a day (TID) | ORAL | Status: DC
  Administered 2020-04-29 – 2020-05-01 (×7): 12.5 mg via ORAL
  Filled 2020-04-29 (×10): qty 1

## 2020-04-29 MED ORDER — ASPIRIN EC 81 MG PO TBEC
81.0000 mg | DELAYED_RELEASE_TABLET | Freq: Every day | ORAL | Status: DC
  Administered 2020-04-30 – 2020-05-01 (×2): 81 mg via ORAL
  Filled 2020-04-29 (×2): qty 1

## 2020-04-29 MED ORDER — PANTOPRAZOLE SODIUM 40 MG PO TBEC
40.0000 mg | DELAYED_RELEASE_TABLET | Freq: Every day | ORAL | Status: DC
  Administered 2020-04-29 – 2020-05-01 (×3): 40 mg via ORAL
  Filled 2020-04-29 (×3): qty 1

## 2020-04-29 MED ORDER — ATORVASTATIN CALCIUM 80 MG PO TABS
80.0000 mg | ORAL_TABLET | Freq: Every day | ORAL | Status: DC
  Administered 2020-04-30 – 2020-05-01 (×2): 80 mg via ORAL
  Filled 2020-04-29 (×3): qty 1

## 2020-04-29 NOTE — Progress Notes (Signed)
Modified Barium Swallow Progress Note  Patient Details  Name: Tyler Glass MRN: [id number] Date of Birth: February 02, 1934  Today's Date: 04/29/2020  Modified Barium Swallow completed.  Full report located under Chart Review in the Imaging Section.  Brief recommendations include the following:  Clinical Impression  Pt was seen for MBS and demonstrates a moderate oropharyngeal dysphagia characterized by premature spillage, instances of penetration/aspiration, pharyngeal residue, and impaired UES opening secondary to reduced movement and duration of hyolaryngeal excursion. Premature spillage was observed with thin liquid as the material fell to the level of the pyriforms. Prior to initiating the swallow, a trace amount of thin liquid was penetrated into the laryngeal vestibule above the level of the vocal folds and was subsequently ejected. During the swallow, additional thin liquid fell below the level of the vocal folds and the pt demonstrated an immediate cough in attempt to eject the aspirate. The pt also demonstrated silent aspiration of thin liquid while attempting a chin tuck. No instances of penetration/aspiration occurred with any other POs. The pt demonstrates significant pharyngeal residue across POs in the valleculae, lateral channels, and pyriforms. In order to address pharyngeal residue, multiple swallows (3) were utilized. Additional compensatory strategies were attempted to address residue but were unsuccessful. Recommend dys 3 diet with nectar thick liquids and use of multiple swallows. SLP will continue to f/u acutely for diet toleration and advancement.   Swallow Evaluation Recommendations       SLP Diet Recommendations: Dysphagia 3 (Mech soft) solids;Nectar thick liquid   Liquid Administration via: Cup   Medication Administration: Crushed with puree   Supervision: Intermittent supervision to cue for compensatory strategies;Staff to assist with self feeding   Compensations:  Multiple dry swallows after each bite/sip (3 swallows per bite/sip)   Postural Changes: Seated upright at 90 degrees   Oral Care Recommendations: Oral care BID        Royetta Crochet 04/29/2020,10:20 AM

## 2020-04-29 NOTE — Progress Notes (Signed)
Pt transferred off unit on tele to Barium swallow evaluation.

## 2020-04-29 NOTE — Consult Note (Signed)
Physical Medicine and Rehabilitation Consult Reason for Consult: Right side weakness slurred speech Referring Physician: [facility]  HPI: Tyler Glass is a 86 y.o. right-handed male with history of hypertension.  History taken from chart review and patient due to dysarthria.  Patient lives with daughter.  Two-level home half bath on main level.  Reportedly independent prior to admission.  Up until recently patient had been living in [place], [place] alone driving doing his own grocery shopping and yard work.  He has a daughter in the area ?that works.  Daughter picked him up and brought: She felt that he is not feeling well.  He presented on 04/28/2020 with right hemiparesis and dysarthria. Cranial CT unremarkable for acute intracranial process. Patient did not receive TPA.  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  MRA angiography does not show significant carotid bifurcation disease.  Admission chemistries glucose 172, creatinine 1.44, alcohol negative, urine drug screen negative, hemoglobin A1c 6.0.  Echocardiogram with EF of 60-65%, no wall motion abnormalities grade 1 diastolic dysfunction.  Currently maintained on aspirin and Plavix for CVA prophylaxis x3 weeks then aspirin alone.  Subcutaneous Lovenox for DVT prophylaxis.  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  Started on dysphagia #3 nectar thick liquids.  Therapy evaluations completed with recommendations of physical medicine rehab consult.  Review of Systems  Constitutional: Positive for malaise/fatigue. Negative for chills and fever.  HENT: Negative for hearing loss.   Eyes: Negative for blurred vision and double vision.  Respiratory: Negative for cough and shortness of breath.   Cardiovascular: Negative for chest pain, palpitations and leg swelling.  Gastrointestinal: Positive for constipation. Negative for heartburn, nausea and vomiting.  Genitourinary:  Positive for urgency. Negative for dysuria and hematuria.  Musculoskeletal: Positive for myalgias.  Skin: Negative for rash.  Neurological: Positive for speech change, focal weakness and weakness.  All other systems reviewed and are negative.  Past Medical History:  Diagnosis Date  . Hypertension    PSH: No past surgical history No pertinent family history of premature CVA. Social History:  reports that he has never smoked. He has never used smokeless tobacco. He reports current alcohol use. No history on file for drug use. Allergies: No Known Allergies Medications Prior to Admission  Medication Sig Dispense Refill  . acetaminophen (TYLENOL) 500 MG tablet Take 1,000 mg by mouth every 6 (six) hours as needed for mild pain.     Marland Kitchen amLODipine (NORVASC) 2.5 MG tablet Take 2.5 mg by mouth daily.     Marland Kitchen atorvastatin (LIPITOR) 40 MG tablet Take 40 mg by mouth daily.     Marland Kitchen lisinopril-hydrochlorothiazide (ZESTORETIC) 10-12.5 MG tablet Take 1 tablet by mouth daily.      Home: Home Living Family/patient expects to be discharged to:: Private residence Living Arrangements: Children (with daughter (she brought him from [facility])) Available Help at Discharge: Family (daughter) Type of Home: House Home Access: Stairs to enter Secretary/administrator of Steps: 1 (threshold) Home Layout: Two level, 1/2 bath on main level (pt stayed on 1st floor ) Alternate Level Stairs-Number of Steps: flight Bathroom Shower/Tub: Other (comment) (upstairs in dtr's home; pt unsure type)  Functional History: Prior Function Level of Independence: Independent Comments: lived alone in [facility]; driving, grocery shopping, yard work; daughter brought him to [facility] after discharged by [place] Functional Status:  Mobility: Bed Mobility Overal bed mobility: Needs Assistance Bed Mobility: Rolling, Sidelying to Sit, Sit to Sidelying Rolling: Supervision Sidelying to  sit: Mod assist Sit to sidelying: Min assist General bed mobility comments:  coming up from rt side required incr assist to achieve midline (leaning rt); return to rt side min assist to RLE Transfers Overall transfer level: Needs assistance Equipment used: None Transfers: Sit to/from Stand, Lateral/Scoot Transfers Sit to Stand: Mod assist  Lateral/Scoot Transfers: Min assist General transfer comment: rt lean in coming to stand; scooting to his right along EOB x 3 with min assist Ambulation/Gait General Gait Details: unable; will need +2 assist    ADL:    Cognition: Cognition Overall Cognitive Status: Within Functional Limits for tasks assessed Orientation Level: Oriented to person, Oriented to situation, Disoriented to place, Disoriented to time Cognition Arousal/Alertness: Awake/alert Behavior During Therapy: WFL for tasks assessed/performed Overall Cognitive Status: Within Functional Limits for tasks assessed General Comments: o x4  Blood pressure 106/69, pulse 84, temperature 97.8 F (36.6 C), temperature source Oral, resp. rate 18, height  (1.854 m), weight 83.9 kg, SpO2 100 %. Physical Exam Vitals reviewed.  Constitutional:      General: He is not in acute distress.    Appearance: He is normal weight.  HENT:     Head: Normocephalic and atraumatic.     Right Ear: External ear normal.     Left Ear: External ear normal.     Nose: Nose normal.  Eyes:     General:        Right eye: No discharge.        Left eye: No discharge.     Extraocular Movements: Extraocular movements intact.  Cardiovascular:     Rate and Rhythm: Normal rate and regular rhythm.  Pulmonary:     Effort: Pulmonary effort is normal. No respiratory distress.     Breath sounds: No stridor.  Abdominal:     General: Abdomen is flat. Bowel sounds are normal. There is no distension.  Musculoskeletal:     Cervical back: Normal range of motion and neck supple.     Comments: No edema or tenderness in extremities  Skin:    General: Skin is warm and dry.  Neurological:      Mental Status: He is alert.     Comments: Alert.   No acute distress following commands.   Makes good eye contact with examiner and oriented x3.  Fair awareness of deficits. Moderate dysarthria Motor: RUE 4+/5 proximal distal R LE: 5+/5 proximal to distal LUE/LLE: 5/5 proximal distal  Psychiatric:        Mood and Affect: Mood normal.        Speech: Speech is slurred.     Comments: Distracted     Results for orders placed or performed during the hospital encounter of 04/27/20 (from the past 24 hour(s))  TSH     Status: None   Collection Time: 04/28/20  1:17 PM  Result Value Ref Range   TSH 1.742 0.350 - 4.500 uIU/mL  Hemoglobin A1c     Status: Abnormal   Collection Time: 04/28/20  1:17 PM  Result Value Ref Range   Hgb A1c MFr Bld 6.0 (H) 4.8 - 5.6 %   Mean Plasma Glucose 125.5 mg/dL  Comprehensive metabolic panel     Status: Abnormal   Collection Time: 04/28/20  1:17 PM  Result Value Ref Range   Sodium 141 135 - 145 mmol/L   Potassium 3.9 3.5 - 5.1 mmol/L   Chloride 101 98 - 111 mmol/L   CO2 26 22 - 32 mmol/L   Glucose, Bld  111 (H) 70 - 99 mg/dL   BUN 16 8 - 23 mg/dL   Creatinine, Ser 1.28 (H) 0.61 - 1.24 mg/dL   Calcium 9.6 8.9 - 78.6 mg/dL   Total Protein 7.1 6.5 - 8.1 g/dL   Albumin 3.8 3.5 - 5.0 g/dL   AST 36 15 - 41 U/L   ALT 29 0 - 44 U/L   Alkaline Phosphatase 69 38 - 126 U/L   Total Bilirubin 1.1 0.3 - 1.2 mg/dL   GFR, Estimated 53 (L) >60 mL/min   Anion gap 14 5 - 15  CBC with Differential/Platelet     Status: None   Collection Time: 04/28/20  1:17 PM  Result Value Ref Range   WBC 7.3 4.0 - 10.5 K/uL   RBC 4.99 4.22 - 5.81 MIL/uL   Hemoglobin 14.8 13.0 - 17.0 g/dL   HCT 76.7 39 - 52 %   MCV 90.0 80.0 - 100.0 fL   MCH 29.7 26.0 - 34.0 pg   MCHC 33.0 30.0 - 36.0 g/dL   RDW 20.9 47.0 - 96.2 %   Platelets 289 150 - 400 K/uL   nRBC 0.0 0.0 - 0.2 %   Neutrophils Relative % 65 %   Neutro Abs 4.7 1.7 - 7.7 K/uL   Lymphocytes Relative 21 %   Lymphs Abs 1.6  0.7 - 4.0 K/uL   Monocytes Relative 10 %   Monocytes Absolute 0.7 0.1 - 1.0 K/uL   Eosinophils Relative 3 %   Eosinophils Absolute 0.3 0.0 - 0.5 K/uL   Basophils Relative 1 %   Basophils Absolute 0.1 0.0 - 0.1 K/uL   Immature Granulocytes 0 %   Abs Immature Granulocytes 0.02 0.00 - 0.07 K/uL  Magnesium     Status: None   Collection Time: 04/28/20  1:17 PM  Result Value Ref Range   Magnesium 2.1 1.7 - 2.4 mg/dL  Vitamin E36     Status: Abnormal   Collection Time: 04/28/20  1:17 PM  Result Value Ref Range   Vitamin B-12 132 (L) 180 - 914 pg/mL  Lipid panel     Status: Abnormal   Collection Time: 04/28/20  1:17 PM  Result Value Ref Range   Cholesterol 196 0 - 200 mg/dL   Triglycerides 629 <476 mg/dL   HDL 58 >54 mg/dL   Total CHOL/HDL Ratio 3.4 RATIO   VLDL 20 0 - 40 mg/dL   LDL Cholesterol 650 (H) 0 - 99 mg/dL   CT HEAD WO CONTRAST  Result Date: 04/27/2020 CLINICAL DATA:  Neuro deficit, acute stroke suspected. EXAM: CT HEAD WITHOUT CONTRAST TECHNIQUE: Contiguous axial images were obtained from the base of the skull through the vertex without intravenous contrast. COMPARISON:  None. FINDINGS: Brain: No evidence of acute large vascular territory infarction, hemorrhage, hydrocephalus, extra-axial collection or mass lesion/mass effect. Scattered white matter hypodensities, likely related to chronic microvascular ischemic disease. Mild generalized cerebral volume loss with ex vacuo ventricular dilation. Vascular: Calcific atherosclerosis. Skull: No acute fracture. Sinuses/Orbits: No acute findings. Other: No mastoid effusions. IMPRESSION: No evidence of acute intracranial abnormality. Electronically Signed   By: Feliberto Harts MD   On: 04/27/2020 14:38   MR ANGIO HEAD WO CONTRAST  Result Date: 04/28/2020 CLINICAL DATA:  Right-sided weakness.  Dysarthria. EXAM: MRI HEAD WITHOUT AND WITH CONTRAST MRA HEAD WITHOUT CONTRAST MRA NECK WITHOUT AND WITH CONTRAST TECHNIQUE: Multiplanar, multiecho  pulse sequences of the brain and surrounding structures were obtained without and with intravenous contrast. Angiographic images of the  Circle of Willis were obtained using MRA technique without intravenous contrast. Angiographic images of the neck were obtained using MRA technique without and with intravenous contrast. Carotid stenosis measurements (when applicable) are obtained utilizing NASCET criteria, using the distal internal carotid diameter as the denominator. CONTRAST:  8.28mL GADAVIST GADOBUTROL 1 MMOL/ML IV SOLN COMPARISON:  Head CT yesterday. FINDINGS: MRI HEAD FINDINGS Brain: Diffusion imaging shows acute infarction in the right lateral medulla. No other acute finding. Mild chronic small-vessel ischemic changes affect pons. Mild chronic small-vessel ischemic changes affect the cerebral hemispheric deep white matter. Generalized age related atrophy. No large vessel territory infarction. No mass lesion, hemorrhage, hydrocephalus or extra-axial collection. Vascular: Major vessels at the base of the brain show flow. Skull and upper cervical spine: Negative Sinuses/Orbits: Clear/normal Other: None MRA HEAD FINDINGS Both internal carotid arteries are widely patent through the skull base and siphon regions. The anterior and middle cerebral vessels are normal without proximal stenosis, aneurysm or vascular malformation. Large left vertebral artery patent to the basilar. No antegrade flow seen in a right vertebral artery. No basilar stenosis. Posterior circulation branch vessels are patent. MRA NECK FINDINGS Brachiocephalic vessel origins from the arch are not included. Both common carotid arteries widely patent to the bifurcation. Both carotid bifurcations appear normal. Right cervical ICA is normal. Irregularity of the medial wall of the left cervical ICA, likely due to fibromuscular disease. 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 Dominant left vertebral artery shows 30-50% stenosis at its origin but wide patency beyond  that to the basilar. Right vertebral artery is a severely diseased in thready vessel which does not show flow beyond the upper cervical region. IMPRESSION: 1. 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 2. Mild chronic small-vessel ischemic change of the pons and cerebral hemispheric white matter otherwise. 3. Neck MR angiography does not show any significant carotid bifurcation disease. Irregularity of the medial wall of the left cervical ICA likely due to fibromuscular disease. 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 4. Dominant left vertebral artery shows 30-50% stenosis at its origin but wide patency beyond that to the basilar. Severely diseased right vertebral artery which does not show flow beyond the upper cervical region. Electronically Signed   By: Paulina Fusi M.D.   On: 04/28/2020 13:04   MR ANGIO NECK W WO CONTRAST  Result Date: 04/28/2020 CLINICAL DATA:  Right-sided weakness.  Dysarthria. EXAM: MRI HEAD WITHOUT AND WITH CONTRAST MRA HEAD WITHOUT CONTRAST MRA NECK WITHOUT AND WITH CONTRAST TECHNIQUE: Multiplanar, multiecho pulse sequences of the brain and surrounding structures were obtained without and with intravenous contrast. Angiographic images of the Circle of Willis were obtained using MRA technique without intravenous contrast. Angiographic images of the neck were obtained using MRA technique without and with intravenous contrast. Carotid stenosis measurements (when applicable) are obtained utilizing NASCET criteria, using the distal internal carotid diameter as the denominator. CONTRAST:  8.16mL GADAVIST GADOBUTROL 1 MMOL/ML IV SOLN COMPARISON:  Head CT yesterday. FINDINGS: MRI HEAD FINDINGS Brain: Diffusion imaging shows acute infarction in the right lateral medulla. No other acute finding. Mild chronic small-vessel ischemic changes affect pons. Mild chronic small-vessel ischemic changes affect the cerebral hemispheric deep white matter. Generalized age related  atrophy. No large vessel territory infarction. No mass lesion, hemorrhage, hydrocephalus or extra-axial collection. Vascular: Major vessels at the base of the brain show flow. Skull and upper cervical spine: Negative Sinuses/Orbits: Clear/normal Other: None MRA HEAD FINDINGS Both internal carotid arteries are widely patent through the skull base and siphon regions. The  anterior and middle cerebral vessels are normal without proximal stenosis, aneurysm or vascular malformation. Large left vertebral artery patent to the basilar. No antegrade flow seen in a right vertebral artery. No basilar stenosis. Posterior circulation branch vessels are patent. MRA NECK FINDINGS Brachiocephalic vessel origins from the arch are not included. Both common carotid arteries widely patent to the bifurcation. Both carotid bifurcations appear normal. Right cervical ICA is normal. Irregularity of the medial wall of the left cervical ICA, likely due to fibromuscular disease. 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 Dominant left vertebral artery shows 30-50% stenosis at its origin but wide patency beyond that to the basilar. Right vertebral artery is a severely diseased in thready vessel which does not show flow beyond the upper cervical region. IMPRESSION: 1. 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 2. Mild chronic small-vessel ischemic change of the pons and cerebral hemispheric white matter otherwise. 3. Neck MR angiography does not show any significant carotid bifurcation disease. Irregularity of the medial wall of the left cervical ICA likely due to fibromuscular disease. 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 4. Dominant left vertebral artery shows 30-50% stenosis at its origin but wide patency beyond that to the basilar. Severely diseased right vertebral artery which does not show flow beyond the upper cervical region. Electronically Signed   By: Paulina FusiMark  Shogry M.D.   On: 04/28/2020 13:04   MR BRAIN WO  CONTRAST  Result Date: 04/28/2020 CLINICAL DATA:  Right-sided weakness.  Dysarthria. EXAM: MRI HEAD WITHOUT AND WITH CONTRAST MRA HEAD WITHOUT CONTRAST MRA NECK WITHOUT AND WITH CONTRAST TECHNIQUE: Multiplanar, multiecho pulse sequences of the brain and surrounding structures were obtained without and with intravenous contrast. Angiographic images of the Circle of Willis were obtained using MRA technique without intravenous contrast. Angiographic images of the neck were obtained using MRA technique without and with intravenous contrast. Carotid stenosis measurements (when applicable) are obtained utilizing NASCET criteria, using the distal internal carotid diameter as the denominator. CONTRAST:  8.713mL GADAVIST GADOBUTROL 1 MMOL/ML IV SOLN COMPARISON:  Head CT yesterday. FINDINGS: MRI HEAD FINDINGS Brain: Diffusion imaging shows acute infarction in the right lateral medulla. No other acute finding. Mild chronic small-vessel ischemic changes affect pons. Mild chronic small-vessel ischemic changes affect the cerebral hemispheric deep white matter. Generalized age related atrophy. No large vessel territory infarction. No mass lesion, hemorrhage, hydrocephalus or extra-axial collection. Vascular: Major vessels at the base of the brain show flow. Skull and upper cervical spine: Negative Sinuses/Orbits: Clear/normal Other: None MRA HEAD FINDINGS Both internal carotid arteries are widely patent through the skull base and siphon regions. The anterior and middle cerebral vessels are normal without proximal stenosis, aneurysm or vascular malformation. Large left vertebral artery patent to the basilar. No antegrade flow seen in a right vertebral artery. No basilar stenosis. Posterior circulation branch vessels are patent. MRA NECK FINDINGS Brachiocephalic vessel origins from the arch are not included. Both common carotid arteries widely patent to the bifurcation. Both carotid bifurcations appear normal. Right cervical ICA is  normal. Irregularity of the medial wall of the left cervical ICA, likely due to fibromuscular disease. 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 Dominant left vertebral artery shows 30-50% stenosis at its origin but wide patency beyond that to the basilar. Right vertebral artery is a severely diseased in thready vessel which does not show flow beyond the upper cervical region. IMPRESSION: 1. 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 2. Mild chronic small-vessel ischemic change of the pons and cerebral hemispheric white matter otherwise.  3. Neck MR angiography does not show any significant carotid bifurcation disease. Irregularity of the medial wall of the left cervical ICA likely due to fibromuscular disease. 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 4. Dominant left vertebral artery shows 30-50% stenosis at its origin but wide patency beyond that to the basilar. Severely diseased right vertebral artery which does not show flow beyond the upper cervical region. Electronically Signed   By: Paulina Fusi M.D.   On: 04/28/2020 13:04   DG Swallowing Func-Speech Pathology  Result Date: 04/29/2020 Completed by Royetta Crochet, SLP Student Supervised and reviewed by Harlon Ditty MA CCC-SLP Objective Swallowing Evaluation: Type of Study: MBS-Modified Barium Swallow Study  Patient Details Name: Tyler Glass MRN: [id number] Date of Birth: May 08, 1934 Today's Date: 04/29/2020 Time: SLP Start Time (ACUTE ONLY): 0930 -SLP Stop Time (ACUTE ONLY): 0951 SLP Time Calculation (min) (ACUTE ONLY): 21 min Past Medical History: Past Medical History: Diagnosis Date . Hypertension  Past Surgical History: No past surgical history on file. HPI: Tyler Glass is a 86 y.o. male with PMH significant for HTN who presents with right sided weakness, unsteady gait and difficulty with speech and trouble swallowing and persistent hiccups. 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 Mild chronic  small-vessel ischemic change of the pons and cerebral hemispheric white matter otherwise.  Subjective: alert, with spouse at bedside Assessment / Plan / Recommendation CHL IP CLINICAL IMPRESSIONS 04/29/2020 Clinical Impression Pt was seen for MBS and demonstrates a moderate oropharyngeal dysphagia characterized by premature spillage, instances of penetration/aspiration, pharyngeal residue, and impaired UES opening secondary to reduced movement and duration of hyolaryngeal excursion. Premature spillage was observed with thin liquid as the material fell to the level of the pyriforms. Prior to initiating the swallow, a trace amount of thin liquid was penetrated into the laryngeal vestibule above the level of the vocal folds and was subsequently ejected. During the swallow, additional thin liquid fell below the level of the vocal folds and the pt demonstrated an immediate cough in attempt to eject the aspirate. The pt also demonstrated silent aspiration of thin liquid while attempting a chin tuck. No instances of penetration/aspiration occurred with any other POs. The pt demonstrates significant pharyngeal residue across POs in the valleculae, lateral channels, and pyriforms. In order to address pharyngeal residue, multiple swallows (3) were utilized. Additional compensatory strategies were attempted to address residue but were unsuccessful. Recommend dys 3 diet with nectar thick liquids and use of multiple swallows. SLP will continue to f/u acutely for diet toleration and advancement. SLP Visit Diagnosis Dysphagia, oropharyngeal phase (R13.12) Attention and concentration deficit following -- Frontal lobe and executive function deficit following -- Impact on safety and function Moderate aspiration risk   CHL IP TREATMENT RECOMMENDATION 04/29/2020 Treatment Recommendations Therapy as outlined in treatment plan below   Prognosis 04/29/2020 Prognosis for Safe Diet Advancement Good Barriers to Reach Goals Time post onset  Barriers/Prognosis Comment -- CHL IP DIET RECOMMENDATION 04/29/2020 SLP Diet Recommendations Dysphagia 3 (Mech soft) solids;Nectar thick liquid Liquid Administration via Cup Medication Administration Crushed with puree Compensations Multiple dry swallows after each bite/sip Postural Changes Seated upright at 90 degrees   CHL IP OTHER RECOMMENDATIONS 04/29/2020 Recommended Consults -- Oral Care Recommendations Oral care BID Other Recommendations --   No flowsheet data found.  CHL IP FREQUENCY AND DURATION 04/29/2020 Speech Therapy Frequency (ACUTE ONLY) min 2x/week Treatment Duration 2 weeks      CHL IP ORAL PHASE 04/29/2020 Oral Phase Impaired Oral - Pudding Teaspoon -- Oral -  Pudding Cup -- Oral - Honey Teaspoon -- Oral - Honey Cup -- Oral - Nectar Teaspoon -- Oral - Nectar Cup -- Oral - Nectar Straw -- Oral - Thin Teaspoon -- Oral - Thin Cup Premature spillage;Delayed oral transit Oral - Thin Straw Premature spillage;Delayed oral transit Oral - Puree WFL Oral - Mech Soft -- Oral - Regular WFL Oral - Multi-Consistency -- Oral - Pill -- Oral Phase - Comment --  CHL IP PHARYNGEAL PHASE 04/29/2020 Pharyngeal Phase Impaired Pharyngeal- Pudding Teaspoon -- Pharyngeal -- Pharyngeal- Pudding Cup -- Pharyngeal -- Pharyngeal- Honey Teaspoon -- Pharyngeal -- Pharyngeal- Honey Cup -- Pharyngeal -- Pharyngeal- Nectar Teaspoon -- Pharyngeal -- Pharyngeal- Nectar Cup -- Pharyngeal -- Pharyngeal- Nectar Straw Pharyngeal residue - valleculae;Pharyngeal residue - pyriform;Reduced anterior laryngeal mobility;Reduced laryngeal elevation;Lateral channel residue;Compensatory strategies attempted (with notebox) Pharyngeal -- Pharyngeal- Thin Teaspoon -- Pharyngeal -- Pharyngeal- Thin Cup Reduced anterior laryngeal mobility;Reduced laryngeal elevation;Pharyngeal residue - valleculae;Lateral channel residue;Pharyngeal residue - pyriform;Penetration/Aspiration before swallow;Trace aspiration Pharyngeal Material enters airway, remains ABOVE  vocal cords then ejected out Pharyngeal- Thin Straw Lateral channel residue;Pharyngeal residue - pyriform;Pharyngeal residue - valleculae;Reduced anterior laryngeal mobility;Reduced laryngeal elevation;Penetration/Aspiration during swallow;Trace aspiration Pharyngeal Material enters airway, remains ABOVE vocal cords then ejected out Pharyngeal- Puree Lateral channel residue;Pharyngeal residue - pyriform;Pharyngeal residue - valleculae;Reduced laryngeal elevation;Reduced anterior laryngeal mobility Pharyngeal -- Pharyngeal- Mechanical Soft -- Pharyngeal -- Pharyngeal- Regular Reduced anterior laryngeal mobility;Reduced laryngeal elevation;Pharyngeal residue - valleculae;Pharyngeal residue - pyriform;Lateral channel residue Pharyngeal -- Pharyngeal- Multi-consistency -- Pharyngeal -- Pharyngeal- Pill -- Pharyngeal -- Pharyngeal Comment --  CHL IP CERVICAL ESOPHAGEAL PHASE 04/29/2020 Cervical Esophageal Phase Impaired Pudding Teaspoon -- Pudding Cup -- Honey Teaspoon -- Honey Cup -- Nectar Teaspoon -- Nectar Cup -- Nectar Straw -- Thin Teaspoon -- Thin Cup -- Thin Straw -- Puree -- Mechanical Soft -- Regular -- Multi-consistency -- Pill -- Cervical Esophageal Comment -- Claudine Mouton 04/29/2020, 10:44 AM              ECHOCARDIOGRAM COMPLETE  Result Date: 04/28/2020    ECHOCARDIOGRAM REPORT   Patient Name:   Tyler Glass [facility] Date of Exam: 04/28/2020 Medical Rec #:  [id number]    Height:       73.0 in Accession #:    [id number]   Weight:       185.0 lb Date of Birth:  May 01, 1934    BSA:          2.082 m Patient Age:    86 years     BP:           125/80 mmHg Patient Gender: M            HR:           80 bpm. Exam Location:  Inpatient Procedure: 2D Echo Indications:    stroke 434.91  History:        Patient has no prior history of Echocardiogram examinations.                 Risk Factors:Hypertension.  Sonographer:    Celene Skeen RDCS (AE) Referring Phys: [phone] DAVID GIRGUIS IMPRESSIONS  1. Left ventricular  ejection fraction, by estimation, is 60 to 65%. The left ventricle has normal function. The left ventricle has no regional wall motion abnormalities. Left ventricular diastolic parameters are consistent with Grade I diastolic dysfunction (impaired relaxation).  2. Right ventricular systolic function is normal. The right ventricular size is normal.  3. The mitral valve is normal in structure. No evidence of  mitral valve regurgitation. No evidence of mitral stenosis.  4. The aortic valve is normal in structure. Aortic valve regurgitation is not visualized. No aortic stenosis is present.  5. The inferior vena cava is normal in size with greater than 50% respiratory variability, suggesting right atrial pressure of 3 mmHg. FINDINGS  Left Ventricle: Left ventricular ejection fraction, by estimation, is 60 to 65%. The left ventricle has normal function. The left ventricle has no regional wall motion abnormalities. The left ventricular internal cavity size was normal in size. There is  no left ventricular hypertrophy. Left ventricular diastolic parameters are consistent with Grade I diastolic dysfunction (impaired relaxation). Normal left ventricular filling pressure. Right Ventricle: The right ventricular size is normal. No increase in right ventricular wall thickness. Right ventricular systolic function is normal. Left Atrium: Left atrial size was normal in size. Right Atrium: Right atrial size was normal in size. Pericardium: There is no evidence of pericardial effusion. Mitral Valve: The mitral valve is normal in structure. No evidence of mitral valve regurgitation. No evidence of mitral valve stenosis. Tricuspid Valve: The tricuspid valve is normal in structure. Tricuspid valve regurgitation is not demonstrated. No evidence of tricuspid stenosis. Aortic Valve: The aortic valve is normal in structure. Aortic valve regurgitation is not visualized. No aortic stenosis is present. Pulmonic Valve: The pulmonic valve was  normal in structure. Pulmonic valve regurgitation is not visualized. No evidence of pulmonic stenosis. Aorta: The aortic root is normal in size and structure. Venous: The inferior vena cava is normal in size with greater than 50% respiratory variability, suggesting right atrial pressure of 3 mmHg. IAS/Shunts: No atrial level shunt detected by color flow Doppler.  LEFT VENTRICLE PLAX 2D LVIDd:         3.60 cm  Diastology LVIDs:         2.30 cm  LV e' medial:    3.05 cm/s LV PW:         1.10 cm  LV E/e' medial:  15.3 LV IVS:        0.90 cm  LV e' lateral:   7.29 cm/s LVOT diam:     2.10 cm  LV E/e' lateral: 6.4 LV SV:         47 LV SV Index:   23 LVOT Area:     3.46 cm  RIGHT VENTRICLE RV S prime:     12.50 cm/s TAPSE (M-mode): 1.6 cm LEFT ATRIUM           Index LA diam:      2.90 cm 1.39 cm/m LA Vol (A2C): 25.6 ml 12.30 ml/m  AORTIC VALVE LVOT Vmax:   64.60 cm/s LVOT Vmean:  51.900 cm/s LVOT VTI:    0.136 m  AORTA Ao Root diam: 3.20 cm MITRAL VALVE MV Area (PHT): 2.73 cm    SHUNTS MV Decel Time: 278 msec    Systemic VTI:  0.14 m MV E velocity: 46.70 cm/s  Systemic Diam: 2.10 cm MV A velocity: 61.30 cm/s MV E/A ratio:  0.76 Mihai Croitoru MD Electronically signed by Thurmon Fair MD Signature Date/Time: 04/28/2020/2:42:34 PM    Final     Assessment/Plan: Diagnosis: Right lateral medullary acute infarct.  Stroke: Continue secondary stroke prophylaxis and Risk Factor Modification listed below:   Antiplatelet therapy:   Blood Pressure Management:  Continue current medication with prn's with permisive HTN per primary team Statin Agent:   Prediabetes management:   Right sided hemiparesis:  PT/OT for mobility, ADL training  Motor recovery: Zoloft  Labs independently reviewed.  Records reviewed and summated above.  1. Does the need for close, 24 hr/day medical supervision in concert with the patient's rehab needs make it unreasonable for this patient to be served in a less intensive setting? Yes   2. Co-Morbidities requiring supervision/potential complications: HTN (monitor and provide prns in accordance with increased physical exertion and pain), CKD (avoid nephrotoxic meds, repeat labs), hyperlipidemia (statin), vitamin B12 deficiency (supplement) 3. Due to safety, disease management, medication administration and patient education, does the patient require 24 hr/day rehab nursing? Yes 4. Does the patient require coordinated care of a physician, rehab nurse, therapy disciplines of PT/OT/SLP to address physical and functional deficits in the context of the above medical diagnosis(es)? Yes Addressing deficits in the following areas: balance, endurance, locomotion, strength, transferring, bathing, dressing, toileting, speech and psychosocial support 5. Can the patient actively participate in an intensive therapy program of at least 3 hrs of therapy per day at least 5 days per week? Yes 6. The potential for patient to make measurable gains while on inpatient rehab is excellent 7. Anticipated functional outcomes upon discharge from inpatient rehab are supervision  with PT, supervision with OT, supervision with SLP. 8. Estimated rehab length of stay to reach the above functional goals is: 14-18 days. 9. Anticipated discharge destination: TBD 10. Overall Rehab/Functional Prognosis: good  RECOMMENDATIONS: This patient's condition is appropriate for continued rehabilitative care in the following setting: CIR if adequate caregiver support available upon discharge. Patient has agreed to participate in recommended program. Yes Note that insurance prior authorization may be required for reimbursement for recommended care.  Comment: Rehab Admissions Coordinator to follow up.  I have personally performed a face to face diagnostic evaluation, including, but not limited to relevant history and physical exam findings, of this patient and developed relevant assessment and plan.  Additionally, I have  reviewed and concur with the physician assistant's documentation above.   Maryla Morrow, MD, ABPMR Mcarthur Rossetti Angiulli, PA-C 04/29/2020

## 2020-04-29 NOTE — Evaluation (Signed)
Occupational Therapy Evaluation Patient Details Name: Tyler Glass MRN: [id number] DOB: [birth year] Today's Date: 04/29/2020    History of Present Illness 86 y.o. male with PMH significant for HTN who presents with right sided weakness, unsteady gait and difficulty with speech and trouble swallowing and persistent hiccups--woke up with these on 04/25/20. He was seen at [facility] and discharged. 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   Clinical Impression   Prior to admission, Pt was independent at home in [facility]. He did yardwork, drove, grocery shopping and cooking etc. Today Pt is mod to max A for stand pivot transfers with strong right lean. RUE grossly 3/5 strength and ataxic with movement from shoulder, deceased sensation and decreased fine and gross motor. Pt overall mod A for ADL at this time. Pt is very motivated and pleasant and would benefit from CIR level therapies post-acute to maximize safety and independence in ADL and functional transfers.    Follow Up Recommendations  CIR    Equipment Recommendations  Other (comment) (defer to next venue of care)    Recommendations for Other Services Rehab consult     Precautions / Restrictions Precautions Precautions: Fall Precaution Comments: pt denies falling prior to or since CVA      Mobility Bed Mobility Overal bed mobility: Needs Assistance Bed Mobility: Rolling;Sidelying to Sit Rolling: Supervision Sidelying to sit: Mod assist       General bed mobility comments: coming up from rt side required incr assist to achieve midline (leaning rt)    Transfers Overall transfer level: Needs assistance Equipment used: 1 person hand held assist Transfers: Sit to/from [employer] Transfers Sit to Stand: Mod assist Stand pivot transfers: Max assist       General transfer comment: use of gait belt and verbal cues for lean and  face to face mod to max boost to stand, using "dancing/rocking" to pivot to recliner    Balance Overall balance assessment: Needs assistance Sitting-balance support: Bilateral upper extremity supported;Feet supported Sitting balance-Leahy Scale: Poor   Postural control: Right lateral lean Standing balance support: No upper extremity supported Standing balance-Leahy Scale: Zero Standing balance comment: rt lean                           ADL either performed or assessed with clinical judgement   ADL Overall ADL's : Needs assistance/impaired Eating/Feeding: Sitting;Moderate assistance Eating/Feeding Details (indicate cue type and reason): will attempt modified utensils - balancing weight with increased handles Grooming: Moderate assistance;Sitting Grooming Details (indicate cue type and reason): RUE (dominant) ataxic and overall weak impacting ability to interact and use functional ADL tools - during oral care wash cloth used to build up handle of toothbrush and assist to guide to mouth Upper Body Bathing: Moderate assistance   Lower Body Bathing: Moderate assistance   Upper Body Dressing : Moderate assistance   Lower Body Dressing: Moderate assistance;Maximal assistance;Sit to/from stand   Toilet Transfer: Maximal assistance;Stand-pivot Toilet Transfer Details (indicate cue type and reason): face to face transfer using gait belt, asisst for body position in space and boost to turn Toileting- Clothing Manipulation and Hygiene: Maximal assistance       Functional mobility during ADLs: Moderate assistance;Maximal assistance (SPT only this session, for true ambulation will require +2) General ADL Comments: Pt very motivated and eager for therapy     Vision         Perception  Praxis      Pertinent Vitals/Pain Pain Assessment: No/denies pain     Hand Dominance Right   Extremity/Trunk Assessment Upper Extremity Assessment Upper Extremity Assessment: RUE  deficits/detail RUE Deficits / Details: grossly 3/5 strength, movements very ataxic from shoulder RUE Sensation: decreased light touch;decreased proprioception RUE Coordination: decreased fine motor;decreased gross motor   Lower Extremity Assessment RLE Deficits / Details: limited assessment with lesser ataxia than RUE noted; strength grossly 4/5 RLE Sensation:  (stated RLE some numbness (?accuracy)) RLE Coordination: decreased gross motor   Cervical / Trunk Assessment Cervical / Trunk Assessment: Other exceptions Cervical / Trunk Exceptions: rt leaning with shortening on rt side of torso   Communication Communication Communication: Expressive difficulties (states doesn't sound like his usual)   Cognition Arousal/Alertness: Awake/alert Behavior During Therapy: WFL for tasks assessed/performed Overall Cognitive Status: Within Functional Limits for tasks assessed                                     General Comments       Exercises     Shoulder Instructions      Home Living Family/patient expects to be discharged to:: Private residence Living Arrangements: Children (with daughter (she brought him from [facility])) Available Help at Discharge: Family (daughter) Type of Home: House Home Access: Stairs to enter Secretary/administrator of Steps: 1 (threshold)   Home Layout: Two level;1/2 bath on main level (pt stayed on 1st floor ) Alternate Level Stairs-Number of Steps: flight   Bathroom Shower/Tub: Other (comment) (upstairs in dtr's home; pt unsure type)                    Prior Functioning/Environment Level of Independence: Independent        Comments: lived alone in [facility]; driving, grocery shopping, yard work; daughter brought him to [facility] after discharged by [place]        OT Problem List: Decreased strength;Decreased activity tolerance;Impaired balance (sitting and/or standing);Decreased coordination;Impaired sensation;Impaired UE functional use;Pain      OT  Treatment/Interventions: Self-care/ADL training;Therapeutic exercise;Neuromuscular education;DME and/or AE instruction;Therapeutic activities;Patient/family education;Balance training    OT Goals(Current goals can be found in the care plan section) Acute Rehab OT Goals Patient Stated Goal: "get my right arm working better" OT Goal Formulation: With patient Time For Goal Achievement: 05/13/20 Potential to Achieve Goals: Good ADL Goals Pt Will Perform Grooming: with set-up;sitting Pt Will Perform Upper Body Dressing: with supervision;sitting Pt Will Perform Lower Body Dressing: with min assist;sit to/from stand Pt Will Transfer to Toilet: with min assist;stand pivot transfer;bedside commode Pt Will Perform Toileting - Clothing Manipulation and hygiene: with min guard assist;sitting/lateral leans Additional ADL Goal #1: Pt will maintain sitting balance EOB for 5 min at min guard level for participation in ADL  OT Frequency: Min 2X/week   Barriers to D/C:            Co-evaluation              AM-PAC OT "6 Clicks" Daily Activity     Outcome Measure Help from another person eating meals?: A Little Help from another person taking care of personal grooming?: A Lot Help from another person toileting, which includes using toliet, bedpan, or urinal?: A Lot Help from another person bathing (including washing, rinsing, drying)?: A Lot Help from another person to put on and taking off regular upper body clothing?: A Lot Help from another person to  put on and taking off regular lower body clothing?: A Lot 6 Click Score: 13   End of Session Equipment Utilized During Treatment: Gait belt Nurse Communication: Mobility status  Activity Tolerance: Patient tolerated treatment well Patient left: in chair;with call bell/phone within reach;with chair alarm set  OT Visit Diagnosis: Unsteadiness on feet (R26.81);Other abnormalities of gait and mobility (R26.89);Muscle weakness (generalized)  (M62.81);Ataxia, unspecified (R27.0);Hemiplegia and hemiparesis Hemiplegia - Right/Left: Right Hemiplegia - dominant/non-dominant: Dominant Hemiplegia - caused by: Cerebral infarction                Time: 0867-6195 OT Time Calculation (min): 20 min Charges:  OT General Charges $OT Visit: 1 Visit OT Evaluation $OT Eval Moderate Complexity: 1 Mod  Nyoka Cowden OTR/L Acute Rehabilitation Services Pager: [phone] Office: [phone]   Evern Bio Asier Desroches 04/29/2020, 1:40 PM

## 2020-04-29 NOTE — Consult Note (Signed)
NEUROLOGY CONSULTATION NOTE   Date of service: April 29, 2020 Patient Name: [name] MRN:  [id number] DOB:  12-11-1933 Reason for consult: "stroke"  History of Present Illness  Basheer Molchan is a 86 y.o. male with PMH significant for HTN who presents with right sided weakness, unsteady gait and difficulty with speech and trouble swallowing and persistent hiccups. He went to bed at 2000 on 04/24/20 and woke up with these on 04/25/20. No prior hx of strokes. Endorses family hx of stroks in his father and brother.  He was seen at [facility] and discharged. He came in for persistent symptoms and R sided weakness. He is unable to swallow food, has been requiring suction to help manage secretions.  LKW: 04/24/20 at 2000 NIHSS: 3 MRS: 0 Out of tPa dn thrombectomy window at the time of my evaluation.   ROS   Constitutional Denies weight loss, fever and chills.  HEENT Denies changes in vision and hearing.  Respiratory Denies SOB and cough.  CV Denies palpitations and CP  GI Denies abdominal pain, nausea, vomiting and diarrhea.  GU Denies dysuria and urinary frequency.  MSK Denies myalgia and joint pain.  Skin Denies rash and pruritus.  Neurological Denies headache and syncope.  Psychiatric Denies recent changes in mood. Denies anxiety and depression.   Past History   Past Medical History:  Diagnosis Date  . Hypertension    History reviewed. No pertinent surgical history. No family history on file. Social History   Socioeconomic History  . Marital status: Married    Spouse name: Not on file  . Number of children: Not on file  . Years of education: Not on file  . Highest education level: Not on file  Occupational History  . Not on file  Tobacco Use  . Smoking status: Never Smoker  . Smokeless tobacco: Never Used  Vaping Use  . Vaping Use: Never used  Substance and Sexual Activity  . Alcohol use: Yes    Comment: occ  . Drug use: Not on file  . Sexual activity: Not on file   Other Topics Concern  . Not on file  Social History Narrative  . Not on file   Social Determinants of Health   Financial Resource Strain:   . Difficulty of Paying Living Expenses: Not on file  Food Insecurity:   . Worried About Programme researcher, broadcasting/film/video in the Last Year: Not on file  . Ran Out of Food in the Last Year: Not on file  Transportation Needs:   . Lack of Transportation (Medical): Not on file  . Lack of Transportation (Non-Medical): Not on file  Physical Activity:   . Days of Exercise per Week: Not on file  . Minutes of Exercise per Session: Not on file  Stress:   . Feeling of Stress : Not on file  Social Connections:   . Frequency of Communication with Friends and Family: Not on file  . Frequency of Social Gatherings with Friends and Family: Not on file  . Attends Religious Services: Not on file  . Active Member of Clubs or Organizations: Not on file  . Attends Banker Meetings: Not on file  . Marital Status: Not on file   No Known Allergies  Medications   Medications Prior to Admission  Medication Sig Dispense Refill Last Dose  . acetaminophen (TYLENOL) 500 MG tablet Take 1,000 mg by mouth every 6 (six) hours as needed for mild pain.    unk  . amLODipine (NORVASC)  2.5 MG tablet Take 2.5 mg by mouth daily.    04/27/2020 at Unknown time  . atorvastatin (LIPITOR) 40 MG tablet Take 40 mg by mouth daily.    04/27/2020 at Unknown time  . lisinopril-hydrochlorothiazide (ZESTORETIC) 10-12.5 MG tablet Take 1 tablet by mouth daily.   04/27/2020 at Unknown time     Vitals   Vitals:   04/28/20 1300 04/28/20 1612 04/28/20 1700 04/28/20 2016  BP: 111/76 126/85 136/88 126/80  Pulse: 78 79 83 84  Resp: 20 18 20 14   Temp: 97.8 F (36.6 C) 97.7 F (36.5 C) 98.1 F (36.7 C) 97.7 F (36.5 C)  TempSrc: Oral Axillary Axillary Oral  SpO2: 99% 99% 96% 98%  Weight:      Height:         Body mass index is 24.41 kg/m.  Physical Exam   General: Laying comfortably  in bed; in no acute distress. HENT: Normal oropharynx and mucosa. Normal external appearance of ears and nose. Neck: Supple, no pain or tenderness  CV: No JVD. No peripheral edema.  Pulmonary: Symmetric Chest rise. Normal respiratory effort.  Abdomen: Soft to touch, non-tender.  Ext: No cyanosis, edema, or deformity  Skin: No rash. Normal palpation of skin.   Musculoskeletal: Normal digits and nails by inspection. No clubbing.   Neurologic Examination  Mental status/Cognition: Alert, oriented to self, place, month and year, good attention. Speech/language: Fluent, comprehension intact, object naming intact, repetition intact. Cranial nerves:   CN II Pupils equal and reactive to light, no VF deficits   CN III,IV,VI EOM intact, no gaze preference or deviation, no nystagmus   CN V normal sensation in V1, V2, and V3 segments bilaterally   CN VII Mild drooping of the corner of the mouth on the Right.   CN VIII normal hearing to speech   CN IX & X minimal palatal elevation on the Right, uvula deviates to the left.   CN XI 5/5 head turn and 5/5 shoulder shrug bilaterally   CN XII midline tongue protrusion   Motor:  Muscle bulk: normal, tone normal, pronator drift none tremor yes, with past pointing BL. Mvmt Root Nerve  Muscle Right Left Comments  SA C5/6 Ax Deltoid 5 5   EF C5/6 Mc Biceps 5 5   EE C6/7/8 Rad Triceps 5 5   WF C6/7 Med FCR 5 5   WE C7/8 PIN ECU 5 5   F Ab C8/T1 U ADM/FDI 5 5   HF L1/2/3 Fem Illopsoas 4+ 5   KE L2/3/4 Fem Quad 4+ 5   DF L4/5 D Peron Tib Ant 5 5   PF S1/2 Tibial Grc/Sol 5 5    Reflexes:  Right Left Comments  Pectoralis      Biceps (C5/6) 2 2   Brachioradialis (C5/6) 2 2    Triceps (C6/7) 2 2    Patellar (L3/4) 2 2    Achilles (S1) 1 1    Hoffman      Plantar     Jaw jerk    Sensation:  Light touch Intact throughout   Pin prick    Temperature    Vibration   Proprioception    Coordination/Complex Motor:  - Finger to Nose with past pointing  BL - Heel to shin with ataxia R worse than left. - Rapid alternating movement with disdiadokokinesia BL - Gait: deferred.  Labs   CBC:  Recent Labs  Lab 04/27/20 1424 04/28/20 1317  WBC 10.0 7.3  NEUTROABS 6.9 4.7  HGB 14.2 14.8  HCT 43.6 44.9  MCV 88.1 90.0  PLT 312 289    Basic Metabolic Panel:  Lab Results  Component Value Date   NA 141 04/28/2020   K 3.9 04/28/2020   CO2 26 04/28/2020   GLUCOSE 111 (H) 04/28/2020   BUN 16 04/28/2020   CREATININE 1.32 (H) 04/28/2020   CALCIUM 9.6 04/28/2020   GFRNONAA 53 (L) 04/28/2020   Lipid Panel:  Lab Results  Component Value Date   LDLCALC 118 (H) 04/28/2020   HgbA1c:  Lab Results  Component Value Date   HGBA1C 6.0 (H) 04/28/2020   Urine Drug Screen:     Component Value Date/Time   LABOPIA NONE DETECTED 04/27/2020 1434   COCAINSCRNUR NONE DETECTED 04/27/2020 1434   LABBENZ NONE DETECTED 04/27/2020 1434   AMPHETMU NONE DETECTED 04/27/2020 1434   THCU NONE DETECTED 04/27/2020 1434   LABBARB NONE DETECTED 04/27/2020 1434    Alcohol Level     Component Value Date/Time   ETH <10 04/27/2020 1434   MRI Brain without contrast: IMPRESSION: 1. 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 2. Mild chronic small-vessel ischemic change of the pons and cerebral hemispheric white matter otherwise. 3. Neck MR angiography does not show any significant carotid bifurcation disease. Irregularity of the medial wall of the left cervical ICA likely due to fibromuscular disease. 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 4. Dominant left vertebral artery shows 30-50% stenosis at its origin but wide patency beyond that to the basilar. Severely diseased right vertebral artery which does not show flow beyond the upper cervical region.  MRA head and Neck: MRA NECK FINDINGS  Brachiocephalic vessel origins from the arch are not included. Both common carotid arteries widely patent to the bifurcation.  Both carotid bifurcations appear normal. Right cervical ICA is normal. Irregularity of the medial wall of the left cervical ICA, likely due to fibromuscular disease. 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  Dominant left vertebral artery shows 30-50% stenosis at its origin but wide patency beyond that to the basilar. Right vertebral artery is a severely diseased in thready vessel which does not show flow beyond the upper cervical region.  MRA HEAD FINDINGS  Both internal carotid arteries are widely patent through the skull base and siphon regions. The anterior and middle cerebral vessels are normal without proximal stenosis, aneurysm or vascular malformation. Large left vertebral artery patent to the basilar. No antegrade flow seen in a right vertebral artery. No basilar stenosis. Posterior circulation branch vessels are patent.  Impression   Ichael Pullara is a 85 y.o. male with PMH significant for HTN who presents with right sided weakness, unsteady gait and difficulty with speech and trouble swallowing and persistent hiccups. 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 Will get stroke workup.  Recommendations  Plan:  Recommend that primary team order following: - Frequent Neuro checks per stroke unit protocol - Recommend obtaining TTE - Recommend obtaining Lipid panel with LDL - Please start statin if LDL > 70 - Recommend HbA1c - Antithrombotic - Aspirin 81mg  and plavix 75mg  daily for 3 weeks, followed by Aspirin alone. - Recommend DVT ppx - SBP goal - permissive hypertension first 24 h < 220/110. Held home meds.  - Recommend Telemetry monitoring for arrythmia - Recommend bedside swallow screen prior to PO intake. - Stroke education booklet - Recommend PT/OT/SLP consult ______________________________________________________________________   Thank you for the opportunity to take part in the care of this patient. If you have any further questions, please contact the  neurology  consultation attending.  Signed,  Erick Blinks [facility] Pager Number [phone]

## 2020-04-29 NOTE — Progress Notes (Addendum)
STROKE TEAM PROGRESS NOTE   INTERVAL HISTORY I have personally reviewed history of presenting illness, electronic medical records and imaging films in PACS.  He presented with sudden onset of dysarthria, unsteady gait, leaning to the right, dysphagia and hiccups.  MRI scan shows a right lateral medullary infarct and MRA shows a diseased right vertebral artery throughout.  Carotid ultrasound is unremarkable.  Echocardiogram is normal.  Urine drug screen is negative.  LDL cholesterol is elevated at mg percent.  Hemoglobin A1c 6.0.  Vitals:   04/29/20 0000 04/29/20 0500 04/29/20 0742 04/29/20 0743  BP: 125/83 108/67  106/69  Pulse: 88 88  84  Resp: Temp: 97.9 F (36.6 C) 97.8 F (36.6 C) 97.8 F (36.6 C) 97.8 F (36.6 C)  TempSrc: Oral Oral  Oral  SpO2: 97% 96%  100%  Weight:      Height:       CBC:  Recent Labs  Lab 04/27/20 1424 04/28/20 1317  WBC 10.0 7.3  NEUTROABS 6.9 4.7  HGB 14.2 14.8  HCT 43.6 44.9  MCV 88.1 90.0  PLT 312 289   Basic Metabolic Panel:  Recent Labs  Lab 04/27/20 1424 04/28/20 1317  NA 139 141  K 3.6 3.9  CL 99 101  CO2 28 26  GLUCOSE 172* 111*  BUN 15 16  CREATININE 1.44* 1.32*  CALCIUM 9.7 9.6  MG  --  2.1   Lipid Panel:  Recent Labs  Lab 04/28/20 1317  CHOL 196  TRIG 101  HDL 58  CHOLHDL 3.4  VLDL 20  LDLCALC 161*   HgbA1c:  Recent Labs  Lab 04/28/20 1317  HGBA1C 6.0*   Urine Drug Screen:  Recent Labs  Lab 04/27/20 1434  LABOPIA NONE DETECTED  COCAINSCRNUR NONE DETECTED  LABBENZ NONE DETECTED  AMPHETMU NONE DETECTED  THCU NONE DETECTED  LABBARB NONE DETECTED    Alcohol Level  Recent Labs  Lab 04/27/20 1434  ETH <10    IMAGING past 24 hours MR ANGIO HEAD WO CONTRAST  Result Date: 04/28/2020 CLINICAL DATA:  Right-sided weakness.  Dysarthria. EXAM: MRI HEAD WITHOUT AND WITH CONTRAST MRA HEAD WITHOUT CONTRAST MRA NECK WITHOUT AND WITH CONTRAST TECHNIQUE: Multiplanar, multiecho pulse sequences of  the brain and surrounding structures were obtained without and with intravenous contrast. Angiographic images of the Circle of Willis were obtained using MRA technique without intravenous contrast. Angiographic images of the neck were obtained using MRA technique without and with intravenous contrast. Carotid stenosis measurements (when applicable) are obtained utilizing NASCET criteria, using the distal internal carotid diameter as the denominator. CONTRAST:  8.35mL GADAVIST GADOBUTROL 1 MMOL/ML IV SOLN COMPARISON:  Head CT yesterday. FINDINGS: MRI HEAD FINDINGS Brain: Diffusion imaging shows acute infarction in the right lateral medulla. No other acute finding. Mild chronic small-vessel ischemic changes affect pons. Mild chronic small-vessel ischemic changes affect the cerebral hemispheric deep white matter. Generalized age related atrophy. No large vessel territory infarction. No mass lesion, hemorrhage, hydrocephalus or extra-axial collection. Vascular: Major vessels at the base of the brain show flow. Skull and upper cervical spine: Negative Sinuses/Orbits: Clear/normal Other: None MRA HEAD FINDINGS Both internal carotid arteries are widely patent through the skull base and siphon regions. The anterior and middle cerebral vessels are normal without proximal stenosis, aneurysm or vascular malformation. Large left vertebral artery patent to the basilar. No antegrade flow seen in a right vertebral artery. No basilar stenosis. Posterior circulation branch vessels are patent. MRA NECK  FINDINGS Brachiocephalic vessel origins from the arch are not included. Both common carotid arteries widely patent to the bifurcation. Both carotid bifurcations appear normal. Right cervical ICA is normal. Irregularity of the medial wall of the left cervical ICA, likely due to fibromuscular disease. 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 Dominant left vertebral artery shows 30-50% stenosis at its origin but wide patency beyond that to the  basilar. Right vertebral artery is a severely diseased in thready vessel which does not show flow beyond the upper cervical region. IMPRESSION: 1. 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 2. Mild chronic small-vessel ischemic change of the pons and cerebral hemispheric white matter otherwise. 3. Neck MR angiography does not show any significant carotid bifurcation disease. Irregularity of the medial wall of the left cervical ICA likely due to fibromuscular disease. 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 4. Dominant left vertebral artery shows 30-50% stenosis at its origin but wide patency beyond that to the basilar. Severely diseased right vertebral artery which does not show flow beyond the upper cervical region. Electronically Signed   By: Paulina Fusi M.D.   On: 04/28/2020 13:04   MR ANGIO NECK W WO CONTRAST  Result Date: 04/28/2020 CLINICAL DATA:  Right-sided weakness.  Dysarthria. EXAM: MRI HEAD WITHOUT AND WITH CONTRAST MRA HEAD WITHOUT CONTRAST MRA NECK WITHOUT AND WITH CONTRAST TECHNIQUE: Multiplanar, multiecho pulse sequences of the brain and surrounding structures were obtained without and with intravenous contrast. Angiographic images of the Circle of Willis were obtained using MRA technique without intravenous contrast. Angiographic images of the neck were obtained using MRA technique without and with intravenous contrast. Carotid stenosis measurements (when applicable) are obtained utilizing NASCET criteria, using the distal internal carotid diameter as the denominator. CONTRAST:  8.83mL GADAVIST GADOBUTROL 1 MMOL/ML IV SOLN COMPARISON:  Head CT yesterday. FINDINGS: MRI HEAD FINDINGS Brain: Diffusion imaging shows acute infarction in the right lateral medulla. No other acute finding. Mild chronic small-vessel ischemic changes affect pons. Mild chronic small-vessel ischemic changes affect the cerebral hemispheric deep white matter. Generalized age related atrophy. No large  vessel territory infarction. No mass lesion, hemorrhage, hydrocephalus or extra-axial collection. Vascular: Major vessels at the base of the brain show flow. Skull and upper cervical spine: Negative Sinuses/Orbits: Clear/normal Other: None MRA HEAD FINDINGS Both internal carotid arteries are widely patent through the skull base and siphon regions. The anterior and middle cerebral vessels are normal without proximal stenosis, aneurysm or vascular malformation. Large left vertebral artery patent to the basilar. No antegrade flow seen in a right vertebral artery. No basilar stenosis. Posterior circulation branch vessels are patent. MRA NECK FINDINGS Brachiocephalic vessel origins from the arch are not included. Both common carotid arteries widely patent to the bifurcation. Both carotid bifurcations appear normal. Right cervical ICA is normal. Irregularity of the medial wall of the left cervical ICA, likely due to fibromuscular disease. 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 Dominant left vertebral artery shows 30-50% stenosis at its origin but wide patency beyond that to the basilar. Right vertebral artery is a severely diseased in thready vessel which does not show flow beyond the upper cervical region. IMPRESSION: 1. 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 2. Mild chronic small-vessel ischemic change of the pons and cerebral hemispheric white matter otherwise. 3. Neck MR angiography does not show any significant carotid bifurcation disease. Irregularity of the medial wall of the left cervical ICA likely due to fibromuscular disease. 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 4. Dominant left vertebral artery shows 30-50% stenosis at  its origin but wide patency beyond that to the basilar. Severely diseased right vertebral artery which does not show flow beyond the upper cervical region. Electronically Signed   By: Paulina Fusi M.D.   On: 04/28/2020 13:04   MR BRAIN WO CONTRAST  Result Date:  04/28/2020 CLINICAL DATA:  Right-sided weakness.  Dysarthria. EXAM: MRI HEAD WITHOUT AND WITH CONTRAST MRA HEAD WITHOUT CONTRAST MRA NECK WITHOUT AND WITH CONTRAST TECHNIQUE: Multiplanar, multiecho pulse sequences of the brain and surrounding structures were obtained without and with intravenous contrast. Angiographic images of the Circle of Willis were obtained using MRA technique without intravenous contrast. Angiographic images of the neck were obtained using MRA technique without and with intravenous contrast. Carotid stenosis measurements (when applicable) are obtained utilizing NASCET criteria, using the distal internal carotid diameter as the denominator. CONTRAST:  8.39mL GADAVIST GADOBUTROL 1 MMOL/ML IV SOLN COMPARISON:  Head CT yesterday. FINDINGS: MRI HEAD FINDINGS Brain: Diffusion imaging shows acute infarction in the right lateral medulla. No other acute finding. Mild chronic small-vessel ischemic changes affect pons. Mild chronic small-vessel ischemic changes affect the cerebral hemispheric deep white matter. Generalized age related atrophy. No large vessel territory infarction. No mass lesion, hemorrhage, hydrocephalus or extra-axial collection. Vascular: Major vessels at the base of the brain show flow. Skull and upper cervical spine: Negative Sinuses/Orbits: Clear/normal Other: None MRA HEAD FINDINGS Both internal carotid arteries are widely patent through the skull base and siphon regions. The anterior and middle cerebral vessels are normal without proximal stenosis, aneurysm or vascular malformation. Large left vertebral artery patent to the basilar. No antegrade flow seen in a right vertebral artery. No basilar stenosis. Posterior circulation branch vessels are patent. MRA NECK FINDINGS Brachiocephalic vessel origins from the arch are not included. Both common carotid arteries widely patent to the bifurcation. Both carotid bifurcations appear normal. Right cervical ICA is normal. Irregularity of  the medial wall of the left cervical ICA, likely due to fibromuscular disease. 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 Dominant left vertebral artery shows 30-50% stenosis at its origin but wide patency beyond that to the basilar. Right vertebral artery is a severely diseased in thready vessel which does not show flow beyond the upper cervical region. IMPRESSION: 1. 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 2. Mild chronic small-vessel ischemic change of the pons and cerebral hemispheric white matter otherwise. 3. Neck MR angiography does not show any significant carotid bifurcation disease. Irregularity of the medial wall of the left cervical ICA likely due to fibromuscular disease. 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 4. Dominant left vertebral artery shows 30-50% stenosis at its origin but wide patency beyond that to the basilar. Severely diseased right vertebral artery which does not show flow beyond the upper cervical region. Electronically Signed   By: Paulina Fusi M.D.   On: 04/28/2020 13:04   DG Swallowing Func-Speech Pathology  Result Date: 04/29/2020 Completed by Royetta Crochet, SLP Student Supervised and reviewed by Harlon Ditty MA CCC-SLP Objective Swallowing Evaluation: Type of Study: MBS-Modified Barium Swallow Study  Patient Details Name: Ardean Melroy MRN: [id number] Date of Birth: Nov 15, 1933 Today's Date: 04/29/2020 Time: SLP Start Time (ACUTE ONLY): 0930 -SLP Stop Time (ACUTE ONLY): 0951 SLP Time Calculation (min) (ACUTE ONLY): 21 min Past Medical History: Past Medical History: Diagnosis Date . Hypertension  Past Surgical History: No past surgical history on file. HPI: Tyler Glass is a 86 y.o. male with PMH significant for HTN who presents with right sided weakness, unsteady gait and difficulty with speech and  trouble swallowing and persistent hiccups. 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 Mild chronic small-vessel ischemic change of the  pons and cerebral hemispheric white matter otherwise.  Subjective: alert, with spouse at bedside Assessment / Plan / Recommendation CHL IP CLINICAL IMPRESSIONS 04/29/2020 Clinical Impression Pt was seen for MBS and demonstrates a moderate oropharyngeal dysphagia characterized by premature spillage, instances of penetration/aspiration, pharyngeal residue, and impaired UES opening secondary to reduced movement and duration of hyolaryngeal excursion. Premature spillage was observed with thin liquid as the material fell to the level of the pyriforms. Prior to initiating the swallow, a trace amount of thin liquid was penetrated into the laryngeal vestibule above the level of the vocal folds and was subsequently ejected. During the swallow, additional thin liquid fell below the level of the vocal folds and the pt demonstrated an immediate cough in attempt to eject the aspirate. The pt also demonstrated silent aspiration of thin liquid while attempting a chin tuck. No instances of penetration/aspiration occurred with any other POs. The pt demonstrates significant pharyngeal residue across POs in the valleculae, lateral channels, and pyriforms. In order to address pharyngeal residue, multiple swallows (3) were utilized. Additional compensatory strategies were attempted to address residue but were unsuccessful. Recommend dys 3 diet with nectar thick liquids and use of multiple swallows. SLP will continue to f/u acutely for diet toleration and advancement. SLP Visit Diagnosis Dysphagia, oropharyngeal phase (R13.12) Attention and concentration deficit following -- Frontal lobe and executive function deficit following -- Impact on safety and function Moderate aspiration risk   CHL IP TREATMENT RECOMMENDATION 04/29/2020 Treatment Recommendations Therapy as outlined in treatment plan below   Prognosis 04/29/2020 Prognosis for Safe Diet Advancement Good Barriers to Reach Goals Time post onset Barriers/Prognosis Comment -- CHL IP  DIET RECOMMENDATION 04/29/2020 SLP Diet Recommendations Dysphagia 3 (Mech soft) solids;Nectar thick liquid Liquid Administration via Cup Medication Administration Crushed with puree Compensations Multiple dry swallows after each bite/sip Postural Changes Seated upright at 90 degrees   CHL IP OTHER RECOMMENDATIONS 04/29/2020 Recommended Consults -- Oral Care Recommendations Oral care BID Other Recommendations --   No flowsheet data found.  CHL IP FREQUENCY AND DURATION 04/29/2020 Speech Therapy Frequency (ACUTE ONLY) min 2x/week Treatment Duration 2 weeks      CHL IP ORAL PHASE 04/29/2020 Oral Phase Impaired Oral - Pudding Teaspoon -- Oral - Pudding Cup -- Oral - Honey Teaspoon -- Oral - Honey Cup -- Oral - Nectar Teaspoon -- Oral - Nectar Cup -- Oral - Nectar Straw -- Oral - Thin Teaspoon -- Oral - Thin Cup Premature spillage;Delayed oral transit Oral - Thin Straw Premature spillage;Delayed oral transit Oral - Puree WFL Oral - Mech Soft -- Oral - Regular WFL Oral - Multi-Consistency -- Oral - Pill -- Oral Phase - Comment --  CHL IP PHARYNGEAL PHASE 04/29/2020 Pharyngeal Phase Impaired Pharyngeal- Pudding Teaspoon -- Pharyngeal -- Pharyngeal- Pudding Cup -- Pharyngeal -- Pharyngeal- Honey Teaspoon -- Pharyngeal -- Pharyngeal- Honey Cup -- Pharyngeal -- Pharyngeal- Nectar Teaspoon -- Pharyngeal -- Pharyngeal- Nectar Cup -- Pharyngeal -- Pharyngeal- Nectar Straw Pharyngeal residue - valleculae;Pharyngeal residue - pyriform;Reduced anterior laryngeal mobility;Reduced laryngeal elevation;Lateral channel residue;Compensatory strategies attempted (with notebox) Pharyngeal -- Pharyngeal- Thin Teaspoon -- Pharyngeal -- Pharyngeal- Thin Cup Reduced anterior laryngeal mobility;Reduced laryngeal elevation;Pharyngeal residue - valleculae;Lateral channel residue;Pharyngeal residue - pyriform;Penetration/Aspiration before swallow;Trace aspiration Pharyngeal Material enters airway, remains ABOVE vocal cords then ejected out  Pharyngeal- Thin Straw Lateral channel residue;Pharyngeal residue - pyriform;Pharyngeal residue - valleculae;Reduced anterior  laryngeal mobility;Reduced laryngeal elevation;Penetration/Aspiration during swallow;Trace aspiration Pharyngeal Material enters airway, remains ABOVE vocal cords then ejected out Pharyngeal- Puree Lateral channel residue;Pharyngeal residue - pyriform;Pharyngeal residue - valleculae;Reduced laryngeal elevation;Reduced anterior laryngeal mobility Pharyngeal -- Pharyngeal- Mechanical Soft -- Pharyngeal -- Pharyngeal- Regular Reduced anterior laryngeal mobility;Reduced laryngeal elevation;Pharyngeal residue - valleculae;Pharyngeal residue - pyriform;Lateral channel residue Pharyngeal -- Pharyngeal- Multi-consistency -- Pharyngeal -- Pharyngeal- Pill -- Pharyngeal -- Pharyngeal Comment --  CHL IP CERVICAL ESOPHAGEAL PHASE 04/29/2020 Cervical Esophageal Phase Impaired Pudding Teaspoon -- Pudding Cup -- Honey Teaspoon -- Honey Cup -- Nectar Teaspoon -- Nectar Cup -- Nectar Straw -- Thin Teaspoon -- Thin Cup -- Thin Straw -- Puree -- Mechanical Soft -- Regular -- Multi-consistency -- Pill -- Cervical Esophageal Comment -- Claudine Mouton 04/29/2020, 10:44 AM              ECHOCARDIOGRAM COMPLETE  Result Date: 04/28/2020    ECHOCARDIOGRAM REPORT   Patient Name:   [facility] Date of Exam: 04/28/2020 Medical Rec #:  [id number]    Height:       73.0 in Accession #:    [id number]   Weight:       185.0 lb Date of Birth:  March 14, 1934    BSA:          2.082 m Patient Age:    86 years     BP:           125/80 mmHg Patient Gender: M            HR:           80 bpm. Exam Location:  Inpatient Procedure: 2D Echo Indications:    stroke 434.91  History:        Patient has no prior history of Echocardiogram examinations.                 Risk Factors:Hypertension.  Sonographer:    Celene Skeen RDCS (AE) Referring Phys: [phone] DAVID GIRGUIS IMPRESSIONS  1. Left ventricular ejection fraction, by estimation,  is 60 to 65%. The left ventricle has normal function. The left ventricle has no regional wall motion abnormalities. Left ventricular diastolic parameters are consistent with Grade I diastolic dysfunction (impaired relaxation).  2. Right ventricular systolic function is normal. The right ventricular size is normal.  3. The mitral valve is normal in structure. No evidence of mitral valve regurgitation. No evidence of mitral stenosis.  4. The aortic valve is normal in structure. Aortic valve regurgitation is not visualized. No aortic stenosis is present.  5. The inferior vena cava is normal in size with greater than 50% respiratory variability, suggesting right atrial pressure of 3 mmHg. FINDINGS  Left Ventricle: Left ventricular ejection fraction, by estimation, is 60 to 65%. The left ventricle has normal function. The left ventricle has no regional wall motion abnormalities. The left ventricular internal cavity size was normal in size. There is  no left ventricular hypertrophy. Left ventricular diastolic parameters are consistent with Grade I diastolic dysfunction (impaired relaxation). Normal left ventricular filling pressure. Right Ventricle: The right ventricular size is normal. No increase in right ventricular wall thickness. Right ventricular systolic function is normal. Left Atrium: Left atrial size was normal in size. Right Atrium: Right atrial size was normal in size. Pericardium: There is no evidence of pericardial effusion. Mitral Valve: The mitral valve is normal in structure. No evidence of mitral valve regurgitation. No evidence of mitral valve stenosis. Tricuspid Valve: The tricuspid valve is normal in structure. Tricuspid valve  regurgitation is not demonstrated. No evidence of tricuspid stenosis. Aortic Valve: The aortic valve is normal in structure. Aortic valve regurgitation is not visualized. No aortic stenosis is present. Pulmonic Valve: The pulmonic valve was normal in structure. Pulmonic valve  regurgitation is not visualized. No evidence of pulmonic stenosis. Aorta: The aortic root is normal in size and structure. Venous: The inferior vena cava is normal in size with greater than 50% respiratory variability, suggesting right atrial pressure of 3 mmHg. IAS/Shunts: No atrial level shunt detected by color flow Doppler.  LEFT VENTRICLE PLAX 2D LVIDd:         3.60 cm  Diastology LVIDs:         2.30 cm  LV e' medial:    3.05 cm/s LV PW:         1.10 cm  LV E/e' medial:  15.3 LV IVS:        0.90 cm  LV e' lateral:   7.29 cm/s LVOT diam:     2.10 cm  LV E/e' lateral: 6.4 LV SV:         47 LV SV Index:   23 LVOT Area:     3.46 cm  RIGHT VENTRICLE RV S prime:     12.50 cm/s TAPSE (M-mode): 1.6 cm LEFT ATRIUM           Index LA diam:      2.90 cm 1.39 cm/m LA Vol (A2C): 25.6 ml 12.30 ml/m  AORTIC VALVE LVOT Vmax:   64.60 cm/s LVOT Vmean:  51.900 cm/s LVOT VTI:    0.136 m  AORTA Ao Root diam: 3.20 cm MITRAL VALVE MV Area (PHT): 2.73 cm    SHUNTS MV Decel Time: 278 msec    Systemic VTI:  0.14 m MV E velocity: 46.70 cm/s  Systemic Diam: 2.10 cm MV A velocity: 61.30 cm/s MV E/A ratio:  0.76 Tyler Croitoru MD Electronically signed by Thurmon FairMihai Croitoru MD Signature Date/Time: 04/28/2020/2:42:34 PM    Final     PHYSICAL EXAM Frail elderly African-American male not in distress. . Afebrile. Head is nontraumatic. Neck is supple without bruit.    Cardiac exam no murmur or gallop. Lungs are clear to auscultation. Distal pulses are well felt. Neurological Exam : He is awake alert oriented to time place and person.  Speech is normal without dysarthria or aphasia.  Extraocular movements are full range without nystagmus.  Mild right eye drooping.  Pupils seem equal reactive.  Tongue is midline.  Motor system exam shows no upper or lower extremity drift but mild in the to nose dysmetria right greater than left and knee to heel dysmetria on right greater than left.  Mild giveaway weakness of right hip flexors.  Subjective  decreased sensation on the left body and right face.  Deep tendon reflexes are symmetric.  Plantars downgoing.  Gait deferred. ASSESSMENT/PLAN Mr. Sandrea HughsJessie Lecy is a 86 y.o. male with history of HTN presenting with persistent R sided weakness w/ gait instability, dysphagia and persistent hiccups.    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    CT head No acute abnormality.   MRI  R lateral medullary infarct. Mild small vessel disease.   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 Dominant L VA 30-50% stenosis at origin. R VA severe stenosis w/ no flow beyond.  2D Echo EF 60-65%. No source of embolus   LDL 118  HgbA1c 6.0  VTE prophylaxis - Lovenox 40 mg sq daily   aspirin 81 mg daily prior to admission per pt, now on aspirin 325 mg daily. Will decrease aspirin to 81 and add plavix. Continue DAPT x 3 months then plavix alone    Therapy recommendations:  CIR  Disposition:  pending   Hiccups  Secondary to medullary infart  Add thorazine 12.5 tid    Hypertension  Stable . Permissive hypertension (OK if < 220/120) but gradually normalize in 5-7 days . Long-term BP goal normotensive  Hyperlipidemia  Home meds:  lipitor 40  Now on lipitor 80  LDL 118, goal < 70  Continue statin at discharge  Dysphagia . Secondary to stroke . MBSS - cleared for D3 nectar thick liquids . Speech on board   Other Stroke Risk Factors  Advanced Age >/= 27   ETOH use, alcohol level <10, advised to drink no more than 2 drink(s) a day  Hospital day # 1  Recommend aspirin 81 mg and Plavix 75 mg daily for 3 months followed by Plavix alone.  Start Thorazine for hiccups.  Statin for elevated lipids.  Continue aggressive risk factor modification and ongoing therapies.  Greater than 50% time during this 35-minute visit was spent on counseling and coordination of care about his medullary stroke and right vertebral  artery occlusion and discussion about treatment of hiccups and stroke prevention and answering questions. F/U as outpatient in stroke clinic in 6 weeks. Stroke team will sign off. Call for questions. Discussed with Dr. Lauraine Rinne, MD To contact Stroke Continuity provider, please refer to [email or web address]. After hours, contact General Neurology

## 2020-04-29 NOTE — Plan of Care (Signed)
Patient is currently resting in bed. Denies pain. NIH Q shift, mNIH Q4. VSS. Remains on Room air. Turns self in bed. Voiding in urinal. MIVF. NPO, suction at bedside.  Call bell within reach. Bed alarm on. Neuro at bedside during the night.   Problem: Education: Goal: Knowledge of disease or condition will improve Outcome: Progressing Goal: Knowledge of secondary prevention will improve Outcome: Progressing Goal: Knowledge of patient specific risk factors addressed and post discharge goals established will improve Outcome: Progressing   Problem: Coping: Goal: Will verbalize positive feelings about self Outcome: Progressing Goal: Will identify appropriate support needs Outcome: Progressing   Problem: Health Behavior/Discharge Planning: Goal: Ability to manage health-related needs will improve Outcome: Progressing   Problem: Self-Care: Goal: Ability to participate in self-care as condition permits will improve Outcome: Progressing Goal: Verbalization of feelings and concerns over difficulty with self-care will improve Outcome: Progressing Goal: Ability to communicate needs accurately will improve Outcome: Progressing   Problem: Nutrition: Goal: Risk of aspiration will decrease Outcome: Progressing Goal: Dietary intake will improve Outcome: Progressing   Problem: Education: Goal: Knowledge of General Education information will improve Description: Including pain rating scale, medication(s)/side effects and non-pharmacologic comfort measures Outcome: Progressing   Problem: Health Behavior/Discharge Planning: Goal: Ability to manage health-related needs will improve Outcome: Progressing   Problem: Clinical Measurements: Goal: Ability to maintain clinical measurements within normal limits will improve Outcome: Progressing Goal: Will remain free from infection Outcome: Progressing Goal: Diagnostic test results will improve Outcome: Progressing Goal: Respiratory  complications will improve Outcome: Progressing Goal: Cardiovascular complication will be avoided Outcome: Progressing   Problem: Activity: Goal: Risk for activity intolerance will decrease Outcome: Progressing   Problem: Nutrition: Goal: Adequate nutrition will be maintained Outcome: Progressing   Problem: Coping: Goal: Level of anxiety will decrease Outcome: Progressing   Problem: Elimination: Goal: Will not experience complications related to bowel motility Outcome: Progressing Goal: Will not experience complications related to urinary retention Outcome: Progressing   Problem: Pain Managment: Goal: General experience of comfort will improve Outcome: Progressing   Problem: Safety: Goal: Ability to remain free from injury will improve Outcome: Progressing   Problem: Skin Integrity: Goal: Risk for impaired skin integrity will decrease Outcome: Progressing

## 2020-04-29 NOTE — Evaluation (Signed)
Physical Therapy Evaluation Patient Details Name: Tyler Glass MRN: [id number] DOB: July 11, 1933 Today's Date: 04/29/2020   History of Present Illness  86 y.o. male with PMH significant for HTN who presents with right sided weakness, unsteady gait and difficulty with speech and trouble swallowing and persistent hiccups--woke up with these on 04/25/20. He was seen at [facility] and discharged. 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  Clinical Impression   Pt admitted with above diagnosis. Patient was completely independent and living alone in [facility] prior to CVA. Currently he requires mod to max assist for mobility. Patient with good cognition and ability to follow commands. Very motivated and will benefit from intensive therapies.  Pt currently with functional limitations due to the deficits listed below (see PT Problem List). Pt will benefit from skilled PT to increase their independence and safety with mobility to allow discharge to the venue listed below.       Follow Up Recommendations CIR    Equipment Recommendations  Other (comment) (TBA next venue)    Recommendations for Other Services Rehab consult     Precautions / Restrictions Precautions Precautions: Fall Precaution Comments: pt denies falling prior to or since CVA      Mobility  Bed Mobility Overal bed mobility: Needs Assistance Bed Mobility: Rolling;Sidelying to Sit;Sit to Sidelying Rolling: Supervision Sidelying to sit: Mod assist     Sit to sidelying: Min assist General bed mobility comments: coming up from rt side required incr assist to achieve midline (leaning rt); return to rt side min assist to RLE    Transfers Overall transfer level: Needs assistance Equipment used: None Transfers: Sit to/from Stand;Lateral/Scoot Transfers Sit to Stand: Mod assist        Lateral/Scoot Transfers: Min assist General  transfer comment: rt lean in coming to stand; scooting to his right along EOB x 3 with min assist  Ambulation/Gait             General Gait Details: unable; will need +2 assist  Stairs            Wheelchair Mobility    Modified Rankin (Stroke Patients Only) Modified Rankin (Stroke Patients Only) Pre-Morbid Rankin Score: No symptoms Modified Rankin: Severe disability     Balance Overall balance assessment: Needs assistance Sitting-balance support: Bilateral upper extremity supported;Feet supported Sitting balance-Leahy Scale: Poor Sitting balance - Comments: rt lean; with time able to maintain midline with LUE support Postural control: Right lateral lean Standing balance support: No upper extremity supported Standing balance-Leahy Scale: Zero Standing balance comment: rt lean                             Pertinent Vitals/Pain Pain Assessment: No/denies pain    Home Living Family/patient expects to be discharged to:: Private residence Living Arrangements: Children (with daughter (she brought him from [facility])) Available Help at Discharge: Family (daughter) Type of Home: House Home Access: Stairs to enter   Secretary/administrator of Steps: 1 (threshold) Home Layout: Two level;1/2 bath on main level (pt stayed on 1st floor )        Prior Function Level of Independence: Independent         Comments: lived alone in [facility]; driving, grocery shopping, yard work; daughter brought him to [facility] after discharged by [employer]     Hand Dominance   Dominant Hand: Right    Extremity/Trunk Assessment   Upper  Extremity Assessment Upper Extremity Assessment: Defer to OT evaluation (noted ataxia )    Lower Extremity Assessment Lower Extremity Assessment: RLE deficits/detail RLE Deficits / Details: limited assessment with lesser ataxia than RUE noted; strength grossly 4/5 RLE Sensation:  (stated RLE some numbness (?accuracy)) RLE Coordination: decreased gross motor     Cervical / Trunk Assessment Cervical / Trunk Assessment: Other exceptions Cervical / Trunk Exceptions: rt leaning with shortening on rt side of torso  Communication   Communication: Expressive difficulties (states doesn't sound like his usual)  Cognition Arousal/Alertness: Awake/alert Behavior During Therapy: WFL for tasks assessed/performed Overall Cognitive Status: Within Functional Limits for tasks assessed                                 General Comments: o x4      General Comments General comments (skin integrity, edema, etc.): Evaluation limited by arrival of transporters to take pt to study    Exercises     Assessment/Plan    PT Assessment Patient needs continued PT services  PT Problem List Decreased strength;Decreased activity tolerance;Decreased balance;Decreased mobility;Decreased coordination;Decreased knowledge of use of DME;Impaired sensation       PT Treatment Interventions DME instruction;Gait training;Functional mobility training;Therapeutic activities;Therapeutic exercise;Balance training;Neuromuscular re-education;Patient/family education;Wheelchair mobility training    PT Goals (Current goals can be found in the Care Plan section)  Acute Rehab PT Goals Patient Stated Goal: be able to walk PT Goal Formulation: With patient Time For Goal Achievement: 05/13/20 Potential to Achieve Goals: Good    Frequency Min 4X/week   Barriers to discharge Other (comment) unknown caregiver support    Co-evaluation               AM-PAC PT "6 Clicks" Mobility  Outcome Measure Help needed turning from your back to your side while in a flat bed without using bedrails?: None Help needed moving from lying on your back to sitting on the side of a flat bed without using bedrails?: A Lot Help needed moving to and from a bed to a chair (including a wheelchair)?: A Lot Help needed standing up from a chair using your arms (e.g., wheelchair or bedside  chair)?: A Lot Help needed to walk in hospital room?: Total Help needed climbing 3-5 steps with a railing? : Total 6 Click Score: 12    End of Session   Activity Tolerance: Patient tolerated treatment well Patient left: in bed;with bed alarm set;Other (comment) (transporters in room) Nurse Communication: Mobility status;Other (comment) (chair set up with alarm pad) PT Visit Diagnosis: Ataxic gait (R26.0);Other symptoms and signs involving the nervous system (R29.898);Hemiplegia and hemiparesis Hemiplegia - Right/Left: Right Hemiplegia - dominant/non-dominant: Dominant Hemiplegia - caused by: Cerebral infarction    Time: 3354-5625 PT Time Calculation (min) (ACUTE ONLY): 17 min   Charges:   PT Evaluation $PT Eval Moderate Complexity: 1 Mod           Jerolyn Center, PT Pager [phone]   Zena Amos 04/29/2020, 9:54 AM

## 2020-04-29 NOTE — Progress Notes (Signed)
PROGRESS NOTE                                                                                                                                                                                                             Patient Demographics:    Tyler Glass, is a 86 y.o. male, DOB - 05-Dec-1933, ZOX:[id number]  Outpatient Primary MD for the patient is Patient, No Pcp Per    LOS - 1  Admit date - 04/27/2020    Chief Complaint  Patient presents with  . Cerebrovascular Accident       Brief Narrative (HPI from H&P) - Tyler Glass is an 86 yo male with PMH HTN who is transferred from [facility] due to right side weakness/ataxia, change in speech, hiccups, difficulty swallowing, was initially evaluated at the [facility] there after he presented to [facility] where he was diagnosed with an acute CVA and admitted for further care.   Subjective:    Tyler Glass today has, No headache, No chest pain, No abdominal pain - No Nausea, No new weakness tingling or numbness, no cough or shortness of breath continues to have weakness on the right side of his body.   Assessment  & Plan :     1. 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  Speech is following currently on dysphagia 3 diet with nectar thick liquids, CIR upon discharge.  Encouraged the patient to sit up in chair in the daytime use I-S and flutter valve for pulmonary toiletry and then prone in bed when at night.  Will advance activity and titrate down oxygen as possible.    SpO2: 100 % Lab Results  Component Value Date   HGBA1C 6.0 (H) 04/28/2020    Lipid Panel     Component Value Date/Time   CHOL 196 04/28/2020 1317   TRIG 101 04/28/2020 1317   HDL 58 04/28/2020 1317   CHOLHDL 3.4 04/28/2020 1317    VLDL 20 04/28/2020 1317   LDLCALC 118 (H) 04/28/2020 1317    2.  Dyslipidemia.  Bumped Lipitor to max.  3. Hypertension.  Permissive hypertension for now.    Condition - Extremely Guarded  Family Communication  :  daughter Tyler Glass [phone] on 04/29/20  Code Status :  Full  Consults  :  Neuro  Procedures  :   MRI-A - 1. 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 2. Mild chronic small-vessel ischemic change of the pons and cerebral hemispheric white matter otherwise. 3. Neck MR angiography does not show any significant carotid bifurcation disease. Irregularity of the medial wall of the left cervical ICA likely due to fibromuscular disease. 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 4. Dominant left vertebral artery shows 30-50% stenosis at its origin but wide patency beyond that to the basilar. Severely diseased right vertebral artery which does not show flow beyond the upper cervical region  TTE - 1. Left ventricular ejection fraction, by estimation, is 60 to 65%. The left ventricle has normal function. The left ventricle has no regional wall motion abnormalities. Left ventricular diastolic parameters are consistent with Grade I diastolic dysfunction (impaired relaxation).  2. Right ventricular systolic function is normal. The right ventricular size is normal.  3. The mitral valve is normal in structure. No evidence of mitral valve regurgitation. No evidence of mitral stenosis.  4. The aortic valve is normal in structure. Aortic valve regurgitation is not visualized. No aortic stenosis is present.  5. The inferior vena cava is normal in size with greater than 50% respiratory variability, suggesting right atrial pressure of 3 mmHg.  PUD Prophylaxis : PPI  Disposition Plan  :    Status is: Inpatient  Remains inpatient appropriate because:IV treatments appropriate due to intensity of illness or inability to take PO   Dispo: The patient is from: Home               Anticipated d/c is to: Home              Anticipated d/c date is: 3 days              Patient currently is not medically stable to d/c.   DVT Prophylaxis  :  Lovenox    Lab Results  Component Value Date   PLT 289 04/28/2020    Diet :  Diet Order            DIET DYS 3 Room service appropriate? Yes; Fluid consistency: Nectar Thick  Diet effective now                  Inpatient Medications  Scheduled Meds: . aspirin  300 mg Rectal Daily   Or  . aspirin  325 mg Oral Daily  . atorvastatin  80 mg Oral Daily  . clopidogrel  75 mg Oral Daily  . enoxaparin (LOVENOX) injection  40 mg Subcutaneous Daily   Continuous Infusions: PRN Meds:.acetaminophen **OR** acetaminophen (TYLENOL) oral liquid 160 mg/5 mL **OR** acetaminophen, hydrALAZINE, labetalol  Antibiotics  :    Anti-infectives (From admission, onward)   None       Time Spent in minutes  30   Susa Raring M.D on 04/29/2020 at 11:00 AM  To page go to [email or web address] - password [facility]  [facility] -  Office  [phone]   See all Orders from today for further details    Objective:   Vitals:   04/29/20 0000 04/29/20 0500 04/29/20 0742 04/29/20 0743  BP: 125/83 108/67  106/69  Pulse: 88 88  84  Resp: Temp: 97.9 F (36.6 C) 97.8 F (36.6 C) 97.8 F (36.6 C) 97.8 F (36.6 C)  TempSrc: Oral Oral  Oral  SpO2: 97%  96%  100%  Weight:      Height:        Wt Readings from Last 3 Encounters:  04/27/20 83.9 kg     Intake/Output Summary (Last 24 hours) at 04/29/2020 1100 Last data filed at 04/29/2020 0407 Gross per 24 hour  Intake 231.75 ml  Output 150 ml  Net 81.75 ml     Physical Exam  Awake Alert, No new F.N deficits, Normal affect [place].AT,PERRAL Supple Neck,No JVD, No cervical lymphadenopathy appriciated.  Symmetrical Chest wall movement, Good air movement bilaterally, CTAB RRR,No Gallops,Rubs or new Murmurs, No Parasternal Heave +ve B.Sounds, Abd Soft, No tenderness, No  organomegaly appriciated, No rebound - guarding or rigidity. No Cyanosis, Clubbing or edema, No new Rash or bruise       Data Review:    CBC Recent Labs  Lab 04/27/20 1424 04/28/20 1317  WBC 10.0 7.3  HGB 14.2 14.8  HCT 43.6 44.9  PLT 312 289  MCV 88.1 90.0  MCH 28.7 29.7  MCHC 32.6 33.0  RDW 14.0 13.8  LYMPHSABS 2.1 1.6  MONOABS 0.7 0.7  EOSABS 0.1 0.3  BASOSABS 0.0 0.1    Recent Labs  Lab 04/27/20 1424 04/28/20 1317  NA 139 141  K 3.6 3.9  CL 99 101  CO2 28 26  GLUCOSE 172* 111*  BUN 15 16  CREATININE 1.44* 1.32*  CALCIUM 9.7 9.6  AST 34 36  ALT 22 29  ALKPHOS 62 69  BILITOT 0.7 1.1  ALBUMIN 4.0 3.8  MG  --  2.1  INR 1.0  --   TSH  --  1.742  HGBA1C  --  6.0*    ------------------------------------------------------------------------------------------------------------------ Recent Labs    04/28/20 1317  CHOL 196  HDL 58  LDLCALC 118*  TRIG 101  CHOLHDL 3.4    Lab Results  Component Value Date   HGBA1C 6.0 (H) 04/28/2020   ------------------------------------------------------------------------------------------------------------------ Recent Labs    04/28/20 1317  TSH 1.742    Cardiac Enzymes No results for input(s): CKMB, TROPONINI, MYOGLOBIN in the last 168 hours.  Invalid input(s): CK ------------------------------------------------------------------------------------------------------------------ No results found for: BNP  Micro Results Recent Results (from the past 240 hour(s))  Respiratory Panel by RT PCR (Flu A&B, Covid) - Nasopharyngeal Swab     Status: None   Collection Time: 04/27/20  2:34 PM   Specimen: Nasopharyngeal Swab  Result Value Ref Range Status   SARS Coronavirus 2 by RT PCR NEGATIVE NEGATIVE Final    Comment: (NOTE) SARS-CoV-2 target nucleic acids are NOT DETECTED.  The SARS-CoV-2 RNA is generally detectable in upper respiratoy specimens during the acute phase of infection. The lowest concentration of  SARS-CoV-2 viral copies this assay can detect is 131 copies/mL. A negative result does not preclude SARS-Cov-2 infection and should not be used as the sole basis for treatment or other patient management decisions. A negative result may occur with  improper specimen collection/handling, submission of specimen other than nasopharyngeal swab, presence of viral mutation(s) within the areas targeted by this assay, and inadequate number of viral copies (<131 copies/mL). A negative result must be combined with clinical observations, patient history, and epidemiological information. The expected result is Negative.  Fact Sheet for Patients:  https://www.moore.com/  Fact Sheet for Healthcare Providers:  https://www.young.biz/  This test is no t yet approved or cleared by the [place] FDA and  has been authorized for detection and/or diagnosis of SARS-CoV-2 by FDA under an Emergency Use Authorization (EUA). This EUA will remain  in effect (meaning this test can be used) for the duration of the COVID-19 declaration under Section 564(b)(1) of the Act, 21 U.S.C. section 360bbb-3(b)(1), unless the authorization is terminated or revoked sooner.     Influenza A by PCR NEGATIVE NEGATIVE Final   Influenza B by PCR NEGATIVE NEGATIVE Final    Comment: (NOTE) The Xpert Xpress SARS-CoV-2/FLU/RSV assay is intended as an aid in  the diagnosis of influenza from Nasopharyngeal swab specimens and  should not be used as a sole basis for treatment. Nasal washings and  aspirates are unacceptable for Xpert Xpress SARS-CoV-2/FLU/RSV  testing.  Fact Sheet for Patients: https://www.moore.com/https://www.fda.gov/media/142436/download  Fact Sheet for Healthcare Providers: https://www.young.biz/https://www.fda.gov/media/142435/download  This test is not yet approved or cleared by the [place] FDA and  has been authorized for detection and/or diagnosis of SARS-CoV-2 by  FDA under an Emergency Use  Authorization (EUA). This EUA will remain  in effect (meaning this test can be used) for the duration of the  Covid-19 declaration under Section 564(b)(1) of the Act, 21  U.S.C. section 360bbb-3(b)(1), unless the authorization is  terminated or revoked. Performed at [facility] [place], [address] [place], [place] [address]     Radiology Reports CT HEAD WO CONTRAST  Result Date: 04/27/2020 CLINICAL DATA:  Neuro deficit, acute stroke suspected. EXAM: CT HEAD WITHOUT CONTRAST TECHNIQUE: Contiguous axial images were obtained from the base of the skull through the vertex without intravenous contrast. COMPARISON:  None. FINDINGS: Brain: No evidence of acute large vascular territory infarction, hemorrhage, hydrocephalus, extra-axial collection or mass lesion/mass effect. Scattered white matter hypodensities, likely related to chronic microvascular ischemic disease. Mild generalized cerebral volume loss with ex vacuo ventricular dilation. Vascular: Calcific atherosclerosis. Skull: No acute fracture. Sinuses/Orbits: No acute findings. Other: No mastoid effusions. IMPRESSION: No evidence of acute intracranial abnormality. Electronically Signed   By: Feliberto HartsFrederick S Jones MD   On: 04/27/2020 14:38   MR ANGIO HEAD WO CONTRAST  Result Date: 04/28/2020 CLINICAL DATA:  Right-sided weakness.  Dysarthria. EXAM: MRI HEAD WITHOUT AND WITH CONTRAST MRA HEAD WITHOUT CONTRAST MRA NECK WITHOUT AND WITH CONTRAST TECHNIQUE: Multiplanar, multiecho pulse sequences of the brain and surrounding structures were obtained without and with intravenous contrast. Angiographic images of the Circle of Willis were obtained using MRA technique without intravenous contrast. Angiographic images of the neck were obtained using MRA technique without and with intravenous contrast. Carotid stenosis measurements (when applicable) are obtained utilizing NASCET criteria, using the distal internal carotid diameter as the denominator.  CONTRAST:  8.353mL GADAVIST GADOBUTROL 1 MMOL/ML IV SOLN COMPARISON:  Head CT yesterday. FINDINGS: MRI HEAD FINDINGS Brain: Diffusion imaging shows acute infarction in the right lateral medulla. No other acute finding. Mild chronic small-vessel ischemic changes affect pons. Mild chronic small-vessel ischemic changes affect the cerebral hemispheric deep white matter. Generalized age related atrophy. No large vessel territory infarction. No mass lesion, hemorrhage, hydrocephalus or extra-axial collection. Vascular: Major vessels at the base of the brain show flow. Skull and upper cervical spine: Negative Sinuses/Orbits: Clear/normal Other: None MRA HEAD FINDINGS Both internal carotid arteries are widely patent through the skull base and siphon regions. The anterior and middle cerebral vessels are normal without proximal stenosis, aneurysm or vascular malformation. Large left vertebral artery patent to the basilar. No antegrade flow seen in a right vertebral artery. No basilar stenosis. Posterior circulation branch vessels are patent. MRA NECK FINDINGS Brachiocephalic vessel origins from the arch are not included. Both common carotid arteries widely patent to the bifurcation.  Both carotid bifurcations appear normal. Right cervical ICA is normal. Irregularity of the medial wall of the left cervical ICA, likely due to fibromuscular disease. 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 Dominant left vertebral artery shows 30-50% stenosis at its origin but wide patency beyond that to the basilar. Right vertebral artery is a severely diseased in thready vessel which does not show flow beyond the upper cervical region. IMPRESSION: 1. 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 2. Mild chronic small-vessel ischemic change of the pons and cerebral hemispheric white matter otherwise. 3. Neck MR angiography does not show any significant carotid bifurcation disease. Irregularity of the medial wall of the left  cervical ICA likely due to fibromuscular disease. 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 4. Dominant left vertebral artery shows 30-50% stenosis at its origin but wide patency beyond that to the basilar. Severely diseased right vertebral artery which does not show flow beyond the upper cervical region. Electronically Signed   By: Paulina Fusi M.D.   On: 04/28/2020 13:04   MR ANGIO NECK W WO CONTRAST  Result Date: 04/28/2020 CLINICAL DATA:  Right-sided weakness.  Dysarthria. EXAM: MRI HEAD WITHOUT AND WITH CONTRAST MRA HEAD WITHOUT CONTRAST MRA NECK WITHOUT AND WITH CONTRAST TECHNIQUE: Multiplanar, multiecho pulse sequences of the brain and surrounding structures were obtained without and with intravenous contrast. Angiographic images of the Circle of Willis were obtained using MRA technique without intravenous contrast. Angiographic images of the neck were obtained using MRA technique without and with intravenous contrast. Carotid stenosis measurements (when applicable) are obtained utilizing NASCET criteria, using the distal internal carotid diameter as the denominator. CONTRAST:  8.82mL GADAVIST GADOBUTROL 1 MMOL/ML IV SOLN COMPARISON:  Head CT yesterday. FINDINGS: MRI HEAD FINDINGS Brain: Diffusion imaging shows acute infarction in the right lateral medulla. No other acute finding. Mild chronic small-vessel ischemic changes affect pons. Mild chronic small-vessel ischemic changes affect the cerebral hemispheric deep white matter. Generalized age related atrophy. No large vessel territory infarction. No mass lesion, hemorrhage, hydrocephalus or extra-axial collection. Vascular: Major vessels at the base of the brain show flow. Skull and upper cervical spine: Negative Sinuses/Orbits: Clear/normal Other: None MRA HEAD FINDINGS Both internal carotid arteries are widely patent through the skull base and siphon regions. The anterior and middle cerebral vessels are normal without proximal stenosis, aneurysm or vascular  malformation. Large left vertebral artery patent to the basilar. No antegrade flow seen in a right vertebral artery. No basilar stenosis. Posterior circulation branch vessels are patent. MRA NECK FINDINGS Brachiocephalic vessel origins from the arch are not included. Both common carotid arteries widely patent to the bifurcation. Both carotid bifurcations appear normal. Right cervical ICA is normal. Irregularity of the medial wall of the left cervical ICA, likely due to fibromuscular disease. 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 Dominant left vertebral artery shows 30-50% stenosis at its origin but wide patency beyond that to the basilar. Right vertebral artery is a severely diseased in thready vessel which does not show flow beyond the upper cervical region. IMPRESSION: 1. 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 2. Mild chronic small-vessel ischemic change of the pons and cerebral hemispheric white matter otherwise. 3. Neck MR angiography does not show any significant carotid bifurcation disease. Irregularity of the medial wall of the left cervical ICA likely due to fibromuscular disease. 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 4. Dominant left vertebral artery shows 30-50% stenosis at its origin but wide patency beyond that to the basilar. Severely diseased right vertebral artery which does not show  flow beyond the upper cervical region. Electronically Signed   By: Paulina Fusi M.D.   On: 04/28/2020 13:04   MR BRAIN WO CONTRAST  Result Date: 04/28/2020 CLINICAL DATA:  Right-sided weakness.  Dysarthria. EXAM: MRI HEAD WITHOUT AND WITH CONTRAST MRA HEAD WITHOUT CONTRAST MRA NECK WITHOUT AND WITH CONTRAST TECHNIQUE: Multiplanar, multiecho pulse sequences of the brain and surrounding structures were obtained without and with intravenous contrast. Angiographic images of the Circle of Willis were obtained using MRA technique without intravenous contrast. Angiographic images of the neck were  obtained using MRA technique without and with intravenous contrast. Carotid stenosis measurements (when applicable) are obtained utilizing NASCET criteria, using the distal internal carotid diameter as the denominator. CONTRAST:  8.37mL GADAVIST GADOBUTROL 1 MMOL/ML IV SOLN COMPARISON:  Head CT yesterday. FINDINGS: MRI HEAD FINDINGS Brain: Diffusion imaging shows acute infarction in the right lateral medulla. No other acute finding. Mild chronic small-vessel ischemic changes affect pons. Mild chronic small-vessel ischemic changes affect the cerebral hemispheric deep white matter. Generalized age related atrophy. No large vessel territory infarction. No mass lesion, hemorrhage, hydrocephalus or extra-axial collection. Vascular: Major vessels at the base of the brain show flow. Skull and upper cervical spine: Negative Sinuses/Orbits: Clear/normal Other: None MRA HEAD FINDINGS Both internal carotid arteries are widely patent through the skull base and siphon regions. The anterior and middle cerebral vessels are normal without proximal stenosis, aneurysm or vascular malformation. Large left vertebral artery patent to the basilar. No antegrade flow seen in a right vertebral artery. No basilar stenosis. Posterior circulation branch vessels are patent. MRA NECK FINDINGS Brachiocephalic vessel origins from the arch are not included. Both common carotid arteries widely patent to the bifurcation. Both carotid bifurcations appear normal. Right cervical ICA is normal. Irregularity of the medial wall of the left cervical ICA, likely due to fibromuscular disease. 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 Dominant left vertebral artery shows 30-50% stenosis at its origin but wide patency beyond that to the basilar. Right vertebral artery is a severely diseased in thready vessel which does not show flow beyond the upper cervical region. IMPRESSION: 1. 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 2. Mild chronic  small-vessel ischemic change of the pons and cerebral hemispheric white matter otherwise. 3. Neck MR angiography does not show any significant carotid bifurcation disease. Irregularity of the medial wall of the left cervical ICA likely due to fibromuscular disease. 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 4. Dominant left vertebral artery shows 30-50% stenosis at its origin but wide patency beyond that to the basilar. Severely diseased right vertebral artery which does not show flow beyond the upper cervical region. Electronically Signed   By: Paulina Fusi M.D.   On: 04/28/2020 13:04   DG Swallowing Func-Speech Pathology  Result Date: 04/29/2020 Completed by Royetta Crochet, SLP Student Supervised and reviewed by Harlon Ditty MA CCC-SLP Objective Swallowing Evaluation: Type of Study: MBS-Modified Barium Swallow Study  Patient Details Name: Ellen Mayol MRN: [id number] Date of Birth: 09-12-1933 Today's Date: 04/29/2020 Time: SLP Start Time (ACUTE ONLY): 0930 -SLP Stop Time (ACUTE ONLY): 0951 SLP Time Calculation (min) (ACUTE ONLY): 21 min Past Medical History: Past Medical History: Diagnosis Date . Hypertension  Past Surgical History: No past surgical history on file. HPI: Effie Janoski is a 86 y.o. male with PMH significant for HTN who presents with right sided weakness, unsteady gait and difficulty with speech and trouble swallowing and persistent hiccups. 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 Mild chronic small-vessel  ischemic change of the pons and cerebral hemispheric white matter otherwise.  Subjective: alert, with spouse at bedside Assessment / Plan / Recommendation CHL IP CLINICAL IMPRESSIONS 04/29/2020 Clinical Impression Pt was seen for MBS and demonstrates a moderate oropharyngeal dysphagia characterized by premature spillage, instances of penetration/aspiration, pharyngeal residue, and impaired UES opening secondary to reduced movement and duration of hyolaryngeal excursion.  Premature spillage was observed with thin liquid as the material fell to the level of the pyriforms. Prior to initiating the swallow, a trace amount of thin liquid was penetrated into the laryngeal vestibule above the level of the vocal folds and was subsequently ejected. During the swallow, additional thin liquid fell below the level of the vocal folds and the pt demonstrated an immediate cough in attempt to eject the aspirate. The pt also demonstrated silent aspiration of thin liquid while attempting a chin tuck. No instances of penetration/aspiration occurred with any other POs. The pt demonstrates significant pharyngeal residue across POs in the valleculae, lateral channels, and pyriforms. In order to address pharyngeal residue, multiple swallows (3) were utilized. Additional compensatory strategies were attempted to address residue but were unsuccessful. Recommend dys 3 diet with nectar thick liquids and use of multiple swallows. SLP will continue to f/u acutely for diet toleration and advancement. SLP Visit Diagnosis Dysphagia, oropharyngeal phase (R13.12) Attention and concentration deficit following -- Frontal lobe and executive function deficit following -- Impact on safety and function Moderate aspiration risk   CHL IP TREATMENT RECOMMENDATION 04/29/2020 Treatment Recommendations Therapy as outlined in treatment plan below   Prognosis 04/29/2020 Prognosis for Safe Diet Advancement Good Barriers to Reach Goals Time Glass onset Barriers/Prognosis Comment -- CHL IP DIET RECOMMENDATION 04/29/2020 SLP Diet Recommendations Dysphagia 3 (Mech soft) solids;Nectar thick liquid Liquid Administration via Cup Medication Administration Crushed with puree Compensations Multiple dry swallows after each bite/sip Postural Changes Seated upright at 90 degrees   CHL IP OTHER RECOMMENDATIONS 04/29/2020 Recommended Consults -- Oral Care Recommendations Oral care BID Other Recommendations --   No flowsheet data found.  CHL IP  FREQUENCY AND DURATION 04/29/2020 Speech Therapy Frequency (ACUTE ONLY) min 2x/week Treatment Duration 2 weeks      CHL IP ORAL PHASE 04/29/2020 Oral Phase Impaired Oral - Pudding Teaspoon -- Oral - Pudding Cup -- Oral - Honey Teaspoon -- Oral - Honey Cup -- Oral - Nectar Teaspoon -- Oral - Nectar Cup -- Oral - Nectar Straw -- Oral - Thin Teaspoon -- Oral - Thin Cup Premature spillage;Delayed oral transit Oral - Thin Straw Premature spillage;Delayed oral transit Oral - Puree WFL Oral - Mech Soft -- Oral - Regular WFL Oral - Multi-Consistency -- Oral - Pill -- Oral Phase - Comment --  CHL IP PHARYNGEAL PHASE 04/29/2020 Pharyngeal Phase Impaired Pharyngeal- Pudding Teaspoon -- Pharyngeal -- Pharyngeal- Pudding Cup -- Pharyngeal -- Pharyngeal- Honey Teaspoon -- Pharyngeal -- Pharyngeal- Honey Cup -- Pharyngeal -- Pharyngeal- Nectar Teaspoon -- Pharyngeal -- Pharyngeal- Nectar Cup -- Pharyngeal -- Pharyngeal- Nectar Straw Pharyngeal residue - valleculae;Pharyngeal residue - pyriform;Reduced anterior laryngeal mobility;Reduced laryngeal elevation;Lateral channel residue;Compensatory strategies attempted (with notebox) Pharyngeal -- Pharyngeal- Thin Teaspoon -- Pharyngeal -- Pharyngeal- Thin Cup Reduced anterior laryngeal mobility;Reduced laryngeal elevation;Pharyngeal residue - valleculae;Lateral channel residue;Pharyngeal residue - pyriform;Penetration/Aspiration before swallow;Trace aspiration Pharyngeal Material enters airway, remains ABOVE vocal cords then ejected out Pharyngeal- Thin Straw Lateral channel residue;Pharyngeal residue - pyriform;Pharyngeal residue - valleculae;Reduced anterior laryngeal mobility;Reduced laryngeal elevation;Penetration/Aspiration during swallow;Trace aspiration Pharyngeal Material enters airway, remains ABOVE vocal cords then ejected out Pharyngeal- Puree  Lateral channel residue;Pharyngeal residue - pyriform;Pharyngeal residue - valleculae;Reduced laryngeal elevation;Reduced anterior  laryngeal mobility Pharyngeal -- Pharyngeal- Mechanical Soft -- Pharyngeal -- Pharyngeal- Regular Reduced anterior laryngeal mobility;Reduced laryngeal elevation;Pharyngeal residue - valleculae;Pharyngeal residue - pyriform;Lateral channel residue Pharyngeal -- Pharyngeal- Multi-consistency -- Pharyngeal -- Pharyngeal- Pill -- Pharyngeal -- Pharyngeal Comment --  CHL IP CERVICAL ESOPHAGEAL PHASE 04/29/2020 Cervical Esophageal Phase Impaired Pudding Teaspoon -- Pudding Cup -- Honey Teaspoon -- Honey Cup -- Nectar Teaspoon -- Nectar Cup -- Nectar Straw -- Thin Teaspoon -- Thin Cup -- Thin Straw -- Puree -- Mechanical Soft -- Regular -- Multi-consistency -- Pill -- Cervical Esophageal Comment -- Claudine Mouton 04/29/2020, 10:44 AM              ECHOCARDIOGRAM COMPLETE  Result Date: 04/28/2020    ECHOCARDIOGRAM REPORT   Patient Name:   V[facility] Date of Exam: 04/28/2020 Medical Rec #:  [id number]    Height:       73.0 in Accession #:    [id number]   Weight:       185.0 lb Date of Birth:  [birth year]    BSA:          2.082 m Patient Age:    86 years     BP:           125/80 mmHg Patient Gender: M            HR:           80 bpm. Exam Location:  Inpatient Procedure: 2D Echo Indications:    stroke 434.91  History:        Patient has no prior history of Echocardiogram examinations.                 Risk Factors:Hypertension.  Sonographer:    Celene Skeen RDCS (AE) Referring Phys: [phone] DAVID GIRGUIS IMPRESSIONS  1. Left ventricular ejection fraction, by estimation, is 60 to 65%. The left ventricle has normal function. The left ventricle has no regional wall motion abnormalities. Left ventricular diastolic parameters are consistent with Grade I diastolic dysfunction (impaired relaxation).  2. Right ventricular systolic function is normal. The right ventricular size is normal.  3. The mitral valve is normal in structure. No evidence of mitral valve regurgitation. No evidence of mitral stenosis.  4. The aortic  valve is normal in structure. Aortic valve regurgitation is not visualized. No aortic stenosis is present.  5. The inferior vena cava is normal in size with greater than 50% respiratory variability, suggesting right atrial pressure of 3 mmHg. FINDINGS  Left Ventricle: Left ventricular ejection fraction, by estimation, is 60 to 65%. The left ventricle has normal function. The left ventricle has no regional wall motion abnormalities. The left ventricular internal cavity size was normal in size. There is  no left ventricular hypertrophy. Left ventricular diastolic parameters are consistent with Grade I diastolic dysfunction (impaired relaxation). Normal left ventricular filling pressure. Right Ventricle: The right ventricular size is normal. No increase in right ventricular wall thickness. Right ventricular systolic function is normal. Left Atrium: Left atrial size was normal in size. Right Atrium: Right atrial size was normal in size. Pericardium: There is no evidence of pericardial effusion. Mitral Valve: The mitral valve is normal in structure. No evidence of mitral valve regurgitation. No evidence of mitral valve stenosis. Tricuspid Valve: The tricuspid valve is normal in structure. Tricuspid valve regurgitation is not demonstrated. No evidence of tricuspid stenosis. Aortic Valve: The aortic valve is normal in structure. Aortic  valve regurgitation is not visualized. No aortic stenosis is present. Pulmonic Valve: The pulmonic valve was normal in structure. Pulmonic valve regurgitation is not visualized. No evidence of pulmonic stenosis. Aorta: The aortic root is normal in size and structure. Venous: The inferior vena cava is normal in size with greater than 50% respiratory variability, suggesting right atrial pressure of 3 mmHg. IAS/Shunts: No atrial level shunt detected by color flow Doppler.  LEFT VENTRICLE PLAX 2D LVIDd:         3.60 cm  Diastology LVIDs:         2.30 cm  LV e' medial:    3.05 cm/s LV PW:          1.10 cm  LV E/e' medial:  15.3 LV IVS:        0.90 cm  LV e' lateral:   7.29 cm/s LVOT diam:     2.10 cm  LV E/e' lateral: 6.4 LV SV:         47 LV SV Index:   23 LVOT Area:     3.46 cm  RIGHT VENTRICLE RV S prime:     12.50 cm/s TAPSE (M-mode): 1.6 cm LEFT ATRIUM           Index LA diam:      2.90 cm 1.39 cm/m LA Vol (A2C): 25.6 ml 12.30 ml/m  AORTIC VALVE LVOT Vmax:   64.60 cm/s LVOT Vmean:  51.900 cm/s LVOT VTI:    0.136 m  AORTA Ao Root diam: 3.20 cm MITRAL VALVE MV Area (PHT): 2.73 cm    SHUNTS MV Decel Time: 278 msec    Systemic VTI:  0.14 m MV E velocity: 46.70 cm/s  Systemic Diam: 2.10 cm MV A velocity: 61.30 cm/s MV E/A ratio:  0.76 Mihai Croitoru MD Electronically signed by Thurmon Fair MD Signature Date/Time: 04/28/2020/2:42:34 PM    Final

## 2020-04-30 MED ORDER — HALOPERIDOL LACTATE 5 MG/ML IJ SOLN
1.0000 mg | Freq: Once | INTRAMUSCULAR | Status: AC
  Administered 2020-04-30: 1 mg via INTRAVENOUS
  Filled 2020-04-30: qty 1

## 2020-04-30 MED ORDER — LACTATED RINGERS IV SOLN: INTRAVENOUS | Status: AC

## 2020-04-30 MED ORDER — RESOURCE THICKENUP CLEAR PO POWD
ORAL | Status: DC | PRN
  Filled 2020-04-30: qty 125

## 2020-04-30 MED ORDER — INFLUENZA VAC A&B SA ADJ QUAD 0.5 ML IM PRSY
0.5000 mL | PREFILLED_SYRINGE | INTRAMUSCULAR | Status: DC
  Filled 2020-04-30: qty 0.5

## 2020-04-30 MED ORDER — CHLORPROMAZINE HCL 25 MG PO TABS
25.0000 mg | ORAL_TABLET | Freq: Once | ORAL | Status: AC
  Administered 2020-04-30: 25 mg via ORAL
  Filled 2020-04-30: qty 1

## 2020-04-30 MED ORDER — ORAL CARE MOUTH RINSE
15.0000 mL | Freq: Two times a day (BID) | OROMUCOSAL | Status: DC
  Administered 2020-04-30 – 2020-05-01 (×3): 15 mL via OROMUCOSAL

## 2020-04-30 NOTE — Progress Notes (Signed)
Inpatient Rehab Admissions:  Inpatient Rehab Consult received.  I met with patient and his daughter, Andi Devon, at the bedside for rehabilitation assessment and to discuss goals and expectations of an inpatient rehab admission.  We discussed supervision goals with expected length of stay about 2 weeks.  Both are in agreement with this and gave permission to begin insurance authorization process.  Andi Devon will be home with patient during the day to provide supervision, if needed.    Signed: Shann Medal, PT, DPT Admissions Coordinator [phone] 04/30/20  12:22 PM

## 2020-04-30 NOTE — Progress Notes (Addendum)
PROGRESS NOTE                                                                                                                                                                                                             Patient Demographics:    Tyler Glass, is a [age] y.o. male, DOB - [birth year], ZOX:[id number]  Outpatient Primary MD for the patient is Tyler Glass    LOS - 2  Admit date - 04/27/2020    Chief Complaint  Patient presents with  . Cerebrovascular Accident       Brief Narrative (HPI from H&P) - [name] is an 86 yo male with PMH HTN who is [facility] due to right side weakness/ataxia, change in speech, hiccups, difficulty swallowing, [facility] where he was diagnosed with an acute CVA and admitted for further care.   Subjective:   Patient in bed, appears comfortable, denies any headache, no fever, no chest pain or pressure, no shortness of breath , no abdominal pain.  To have mild right-sided  weakness.   Assessment  & Plan :     1. 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  Continue PT OT and speech.  Stroke team following, eventual disposition CIR.     Encouraged the patient to sit up in chair in the daytime use I-S and flutter valve for pulmonary toiletry and then prone in bed when at night.  Will advance activity and titrate down oxygen as possible.    SpO2: 99 % Lab Results  Component Value Date   HGBA1C 6.0 (H) 04/28/2020    Lipid Panel     Component Value Date/Time   CHOL 196 04/28/2020 1317   TRIG 101 04/28/2020 1317   HDL 58 04/28/2020 1317   CHOLHDL 3.4 04/28/2020 1317   VLDL  20 04/28/2020 1317   LDLCALC 118 (H) 04/28/2020 1317    2.  Dyslipidemia.  Lipitor to max.  3. Hypertension.  Permissive hypertension for now.  4.  AKI versus CKD 3A.  Creatinine seems to  be plateauing around 1.3.  Will gently hydrate and monitor.  No baseline available.    Condition - Extremely Guarded  Family Communication  :  daughter Tyler Glass [phone] on 04/29/20  Code Status :  Full  Consults  :  Neuro  Procedures  :   MRI-A - 1. 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 2. Mild chronic small-vessel ischemic change of the pons and cerebral hemispheric white matter otherwise. 3. Neck MR angiography does not show any significant carotid bifurcation disease. Irregularity of the medial wall of the left cervical ICA likely due to fibromuscular disease. 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 4. Dominant left vertebral artery shows 30-50% stenosis at its origin but wide patency beyond that to the basilar. Severely diseased right vertebral artery which does not show flow beyond the upper cervical region  TTE - 1. Left ventricular ejection fraction, by estimation, is 60 to 65%. The left ventricle has normal function. The left ventricle has no regional wall motion abnormalities. Left ventricular diastolic parameters are consistent with Grade I diastolic dysfunction (impaired relaxation).  2. Right ventricular systolic function is normal. The right ventricular size is normal.  3. The mitral valve is normal in structure. No evidence of mitral valve regurgitation. No evidence of mitral stenosis.  4. The aortic valve is normal in structure. Aortic valve regurgitation is not visualized. No aortic stenosis is present.  5. The inferior vena cava is normal in size with greater than 50% respiratory variability, suggesting right atrial pressure of 3 mmHg.  PUD Prophylaxis : PPI  Disposition Plan  :    Status is: Inpatient  Remains inpatient appropriate because:IV  treatments appropriate due to intensity of illness or inability to take PO   Dispo: The patient is from: Home              Anticipated d/c is to: Home              Anticipated d/c date is: 3 days              Patient currently is not medically stable to d/c.   DVT Prophylaxis  :  Lovenox    Lab Results  Component Value Date   PLT 289 04/28/2020    Diet :  Diet Order            DIET DYS 3 Room service appropriate? Yes; Fluid consistency: Nectar Thick  Diet effective now                  Inpatient Medications  Scheduled Meds: . aspirin EC  81 mg Oral Daily  . atorvastatin  80 mg Oral Daily  . chlorproMAZINE  12.5 mg Oral TID  . clopidogrel  75 mg Oral Daily  . enoxaparin (LOVENOX) injection  40 mg Subcutaneous Daily  . mouth rinse  15 mL Mouth Rinse BID  . pantoprazole  40 mg Oral Daily   Continuous Infusions: PRN Meds:.acetaminophen **OR** acetaminophen (TYLENOL) oral liquid 160 mg/5 mL **OR** acetaminophen, hydrALAZINE, labetalol  Antibiotics  :    Anti-infectives (From admission, onward)   None       Time Spent in minutes  30   Susa Raring M.D on 04/30/2020 at 8:48 AM  To page go to [email or web address] - password [email or web address]  [facility] -  Office  [phone]   See all Orders from today for further details    Objective:   Vitals:   04/29/20 2000 04/29/20 2317 04/30/20 0400 04/30/20 0738  BP:  121/78 108/70 98/72 101/68  Pulse: 100 99 80 80  Resp: 20 18 18 19   Temp: 98 F (36.7 C) 98.3 F (36.8 C) (!) 97.5 F (36.4 C) 98.9 F (37.2 C)  TempSrc: Axillary Axillary Oral Oral  SpO2: 97% 97% 96% 99%  Weight:      Height:        Wt Readings from Last 3 Encounters:  04/27/20 83.9 kg    No intake or output data in the 24 hours ending 04/30/20 0848   Physical Exam  Awake Alert, No new F.N deficits, mild right-sided facial droop with minimal subjective right-sided weakness [place].AT,PERRAL Supple Neck,No JVD, No cervical lymphadenopathy  appriciated.  Symmetrical Chest wall movement, Good air movement bilaterally, CTAB RRR,No Gallops, Rubs or new Murmurs, No Parasternal Heave +ve B.Sounds, Abd Soft, No tenderness, No organomegaly appriciated, No rebound - guarding or rigidity. No Cyanosis, Clubbing or edema, No new Rash or bruise       Data Review:    CBC Recent Labs  Lab 04/27/20 1424 04/28/20 1317  WBC 10.0 7.3  HGB 14.2 14.8  HCT 43.6 44.9  PLT 312 289  MCV 88.1 90.0  MCH 28.7 29.7  MCHC 32.6 33.0  RDW 14.0 13.8  LYMPHSABS 2.1 1.6  MONOABS 0.7 0.7  EOSABS 0.1 0.3  BASOSABS 0.0 0.1    Recent Labs  Lab 04/27/20 1424 04/28/20 1317  NA 139 141  K 3.6 3.9  CL 99 101  CO2 28 26  GLUCOSE 172* 111*  BUN 15 16  CREATININE 1.44* 1.32*  CALCIUM 9.7 9.6  AST 34 36  ALT 22 29  ALKPHOS 62 69  BILITOT 0.7 1.1  ALBUMIN 4.0 3.8  MG  --  2.1  INR 1.0  --   TSH  --  1.742  HGBA1C  --  6.0*    ------------------------------------------------------------------------------------------------------------------ Recent Labs    04/28/20 1317  CHOL 196  HDL 58  LDLCALC 118*  TRIG 101  CHOLHDL 3.4    Lab Results  Component Value Date   HGBA1C 6.0 (H) 04/28/2020   ------------------------------------------------------------------------------------------------------------------ Recent Labs    04/28/20 1317  TSH 1.742    Cardiac Enzymes No results for input(s): CKMB, TROPONINI, MYOGLOBIN in the last 168 hours.  Invalid input(s): CK ------------------------------------------------------------------------------------------------------------------ No results found for: BNP  Micro Results Recent Results (from the past 240 hour(s))  Respiratory Panel by RT PCR (Flu A&B, Covid) - Nasopharyngeal Swab     Status: None   Collection Time: 04/27/20  2:34 PM   Specimen: Nasopharyngeal Swab  Result Value Ref Range Status   SARS Coronavirus 2 by RT PCR NEGATIVE NEGATIVE Final    Comment:  (NOTE) SARS-CoV-2 target nucleic acids are NOT DETECTED.  The SARS-CoV-2 RNA is generally detectable in upper respiratoy specimens during the acute phase of infection. The lowest concentration of SARS-CoV-2 viral copies this assay can detect is 131 copies/mL. A negative result does not preclude SARS-Cov-2 infection and should not be used as the sole basis for treatment or other patient management decisions. A negative result may occur with  improper specimen collection/handling, submission of specimen other than nasopharyngeal swab, presence of viral mutation(s) within the areas targeted by this assay, and inadequate number of viral copies (<131 copies/mL). A negative result must be combined with clinical observations, patient history, and epidemiological information. The expected result is Negative.  Fact Sheet for Patients:  13/08/21  Fact Sheet for Healthcare Providers:  https://www.moore.com/  This test is no t yet approved  or cleared by the [place] and  has been authorized for detection and/or diagnosis of SARS-CoV-2 by FDA under an Emergency Use Authorization (EUA). This EUA will remain  in effect (meaning this test can be used) for the duration of the COVID-19 declaration under Section 564(b)(1) of the Act, 21 U.S.C. section 360bbb-3(b)(1), unless the authorization is terminated or revoked sooner.     Influenza A by PCR NEGATIVE NEGATIVE Final   Influenza B by PCR NEGATIVE NEGATIVE Final    Comment: (NOTE) The Xpert Xpress SARS-CoV-2/FLU/RSV assay is intended as an aid in  the diagnosis of influenza from Nasopharyngeal swab specimens and  should not be used as a sole basis for treatment. Nasal washings and  aspirates are unacceptable for Xpert Xpress SARS-CoV-2/FLU/RSV  testing.  Fact Sheet for Patients: https://www.moore.com/  Fact Sheet for Healthcare  Providers: https://www.young.biz/  This test is not yet approved or cleared by the [place] FDA and  has been authorized for detection and/or diagnosis of SARS-CoV-2 by  FDA under an Emergency Use Authorization (EUA). This EUA will remain  in effect (meaning this test can be used) for the duration of the  Covid-19 declaration under Section 564(b)(1) of the Act, 21  U.S.C. section 360bbb-3(b)(1), unless the authorization is  terminated or revoked. Performed at [facility], [address] [place], [place] [address]     Radiology Reports CT HEAD WO CONTRAST  Result Date: 04/27/2020 CLINICAL DATA:  Neuro deficit, acute stroke suspected. EXAM: CT HEAD WITHOUT CONTRAST TECHNIQUE: Contiguous axial images were obtained from the base of the skull through the vertex without intravenous contrast. COMPARISON:  None. FINDINGS: Brain: No evidence of acute large vascular territory infarction, hemorrhage, hydrocephalus, extra-axial collection or mass lesion/mass effect. Scattered white matter hypodensities, likely related to chronic microvascular ischemic disease. Mild generalized cerebral volume loss with ex vacuo ventricular dilation. Vascular: Calcific atherosclerosis. Skull: No acute fracture. Sinuses/Orbits: No acute findings. Other: No mastoid effusions. IMPRESSION: No evidence of acute intracranial abnormality. Electronically Signed   By: Feliberto Harts MD   On: 04/27/2020 14:38   MR ANGIO HEAD WO CONTRAST  Result Date: 04/28/2020 CLINICAL DATA:  Right-sided weakness.  Dysarthria. EXAM: MRI HEAD WITHOUT AND WITH CONTRAST MRA HEAD WITHOUT CONTRAST MRA NECK WITHOUT AND WITH CONTRAST TECHNIQUE: Multiplanar, multiecho pulse sequences of the brain and surrounding structures were obtained without and with intravenous contrast. Angiographic images of the Circle of Willis were obtained using MRA technique without intravenous contrast. Angiographic images of the neck were  obtained using MRA technique without and with intravenous contrast. Carotid stenosis measurements (when applicable) are obtained utilizing NASCET criteria, using the distal internal carotid diameter as the denominator. CONTRAST:  8.27mL GADAVIST GADOBUTROL 1 MMOL/ML IV SOLN COMPARISON:  Head CT yesterday. FINDINGS: MRI HEAD FINDINGS Brain: Diffusion imaging shows acute infarction in the right lateral medulla. No other acute finding. Mild chronic small-vessel ischemic changes affect pons. Mild chronic small-vessel ischemic changes affect the cerebral hemispheric deep white matter. Generalized age related atrophy. No large vessel territory infarction. No mass lesion, hemorrhage, hydrocephalus or extra-axial collection. Vascular: Major vessels at the base of the brain show flow. Skull and upper cervical spine: Negative Sinuses/Orbits: Clear/normal Other: None MRA HEAD FINDINGS Both internal carotid arteries are widely patent through the skull base and siphon regions. The anterior and middle cerebral vessels are normal without proximal stenosis, aneurysm or vascular malformation. Large left vertebral artery patent to the basilar. No antegrade flow seen in a right vertebral  artery. No basilar stenosis. Posterior circulation branch vessels are patent. MRA NECK FINDINGS Brachiocephalic vessel origins from the arch are not included. Both common carotid arteries widely patent to the bifurcation. Both carotid bifurcations appear normal. Right cervical ICA is normal. Irregularity of the medial wall of the left cervical ICA, likely due to fibromuscular disease. 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 Dominant left vertebral artery shows 30-50% stenosis at its origin but wide patency beyond that to the basilar. Right vertebral artery is a severely diseased in thready vessel which does not show flow beyond the upper cervical region. IMPRESSION: 1. 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 2. Mild chronic  small-vessel ischemic change of the pons and cerebral hemispheric white matter otherwise. 3. Neck MR angiography does not show any significant carotid bifurcation disease. Irregularity of the medial wall of the left cervical ICA likely due to fibromuscular disease. 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 4. Dominant left vertebral artery shows 30-50% stenosis at its origin but wide patency beyond that to the basilar. Severely diseased right vertebral artery which does not show flow beyond the upper cervical region. Electronically Signed   By: Paulina Fusi M.D.   On: 04/28/2020 13:04   MR ANGIO NECK W WO CONTRAST  Result Date: 04/28/2020 CLINICAL DATA:  Right-sided weakness.  Dysarthria. EXAM: MRI HEAD WITHOUT AND WITH CONTRAST MRA HEAD WITHOUT CONTRAST MRA NECK WITHOUT AND WITH CONTRAST TECHNIQUE: Multiplanar, multiecho pulse sequences of the brain and surrounding structures were obtained without and with intravenous contrast. Angiographic images of the Circle of Willis were obtained using MRA technique without intravenous contrast. Angiographic images of the neck were obtained using MRA technique without and with intravenous contrast. Carotid stenosis measurements (when applicable) are obtained utilizing NASCET criteria, using the distal internal carotid diameter as the denominator. CONTRAST:  8.56mL GADAVIST GADOBUTROL 1 MMOL/ML IV SOLN COMPARISON:  Head CT yesterday. FINDINGS: MRI HEAD FINDINGS Brain: Diffusion imaging shows acute infarction in the right lateral medulla. No other acute finding. Mild chronic small-vessel ischemic changes affect pons. Mild chronic small-vessel ischemic changes affect the cerebral hemispheric deep white matter. Generalized age related atrophy. No large vessel territory infarction. No mass lesion, hemorrhage, hydrocephalus or extra-axial collection. Vascular: Major vessels at the base of the brain show flow. Skull and upper cervical spine: Negative Sinuses/Orbits: Clear/normal Other: None  MRA HEAD FINDINGS Both internal carotid arteries are widely patent through the skull base and siphon regions. The anterior and middle cerebral vessels are normal without proximal stenosis, aneurysm or vascular malformation. Large left vertebral artery patent to the basilar. No antegrade flow seen in a right vertebral artery. No basilar stenosis. Posterior circulation branch vessels are patent. MRA NECK FINDINGS Brachiocephalic vessel origins from the arch are not included. Both common carotid arteries widely patent to the bifurcation. Both carotid bifurcations appear normal. Right cervical ICA is normal. Irregularity of the medial wall of the left cervical ICA, likely due to fibromuscular disease. 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 Dominant left vertebral artery shows 30-50% stenosis at its origin but wide patency beyond that to the basilar. Right vertebral artery is a severely diseased in thready vessel which does not show flow beyond the upper cervical region. IMPRESSION: 1. 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 2. Mild chronic small-vessel ischemic change of the pons and cerebral hemispheric white matter otherwise. 3. Neck MR angiography does not show any significant carotid bifurcation disease. Irregularity of the medial wall of the left cervical ICA likely due to fibromuscular disease. Possible  2 mm pseudoaneurysm. 4. Dominant left vertebral artery shows 30-50% stenosis at its origin but wide patency beyond that to the basilar. Severely diseased right vertebral artery which does not show flow beyond the upper cervical region. Electronically Signed   By: Paulina Fusi M.D.   On: 04/28/2020 13:04   MR BRAIN WO CONTRAST  Result Date: 04/28/2020 CLINICAL DATA:  Right-sided weakness.  Dysarthria. EXAM: MRI HEAD WITHOUT AND WITH CONTRAST MRA HEAD WITHOUT CONTRAST MRA NECK WITHOUT AND WITH CONTRAST TECHNIQUE: Multiplanar, multiecho pulse sequences of the brain and surrounding  structures were obtained without and with intravenous contrast. Angiographic images of the Circle of Willis were obtained using MRA technique without intravenous contrast. Angiographic images of the neck were obtained using MRA technique without and with intravenous contrast. Carotid stenosis measurements (when applicable) are obtained utilizing NASCET criteria, using the distal internal carotid diameter as the denominator. CONTRAST:  8.68mL GADAVIST GADOBUTROL 1 MMOL/ML IV SOLN COMPARISON:  Head CT yesterday. FINDINGS: MRI HEAD FINDINGS Brain: Diffusion imaging shows acute infarction in the right lateral medulla. No other acute finding. Mild chronic small-vessel ischemic changes affect pons. Mild chronic small-vessel ischemic changes affect the cerebral hemispheric deep white matter. Generalized age related atrophy. No large vessel territory infarction. No mass lesion, hemorrhage, hydrocephalus or extra-axial collection. Vascular: Major vessels at the base of the brain show flow. Skull and upper cervical spine: Negative Sinuses/Orbits: Clear/normal Other: None MRA HEAD FINDINGS Both internal carotid arteries are widely patent through the skull base and siphon regions. The anterior and middle cerebral vessels are normal without proximal stenosis, aneurysm or vascular malformation. Large left vertebral artery patent to the basilar. No antegrade flow seen in a right vertebral artery. No basilar stenosis. Posterior circulation branch vessels are patent. MRA NECK FINDINGS Brachiocephalic vessel origins from the arch are not included. Both common carotid arteries widely patent to the bifurcation. Both carotid bifurcations appear normal. Right cervical ICA is normal. Irregularity of the medial wall of the left cervical ICA, likely due to fibromuscular disease. 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 Dominant left vertebral artery shows 30-50% stenosis at its origin but wide patency beyond that to the basilar. Right vertebral artery  is a severely diseased in thready vessel which does not show flow beyond the upper cervical region. IMPRESSION: 1. 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 2. Mild chronic small-vessel ischemic change of the pons and cerebral hemispheric white matter otherwise. 3. Neck MR angiography does not show any significant carotid bifurcation disease. Irregularity of the medial wall of the left cervical ICA likely due to fibromuscular disease. 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 4. Dominant left vertebral artery shows 30-50% stenosis at its origin but wide patency beyond that to the basilar. Severely diseased right vertebral artery which does not show flow beyond the upper cervical region. Electronically Signed   By: Paulina Fusi M.D.   On: 04/28/2020 13:04   DG Swallowing Func-Speech Pathology  Result Date: 04/29/2020 Completed by Royetta Crochet, SLP Student Supervised and reviewed by Harlon Ditty MA CCC-SLP Objective Swallowing Evaluation: Type of Study: MBS-Modified Barium Swallow Study  Patient Details Name: Tyler Glass MRN: [id number] Date of Birth: 07/26/1933 Today's Date: 04/29/2020 Time: SLP Start Time (ACUTE ONLY): 0930 -SLP Stop Time (ACUTE ONLY): 0951 SLP Time Calculation (min) (ACUTE ONLY): 21 min Past Medical History: Past Medical History: Diagnosis Date . Hypertension  Past Surgical History: No past surgical history on file. HPI: Clayvon Parlett is a 86 y.o. male with PMH significant for HTN who  presents with right sided weakness, unsteady gait and difficulty with speech and trouble swallowing and persistent hiccups. 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 Mild chronic small-vessel ischemic change of the pons and cerebral hemispheric white matter otherwise.  Subjective: alert, with spouse at bedside Assessment / Plan / Recommendation CHL IP CLINICAL IMPRESSIONS 04/29/2020 Clinical Impression Pt was seen for MBS and demonstrates a moderate  oropharyngeal dysphagia characterized by premature spillage, instances of penetration/aspiration, pharyngeal residue, and impaired UES opening secondary to reduced movement and duration of hyolaryngeal excursion. Premature spillage was observed with thin liquid as the material fell to the level of the pyriforms. Prior to initiating the swallow, a trace amount of thin liquid was penetrated into the laryngeal vestibule above the level of the vocal folds and was subsequently ejected. During the swallow, additional thin liquid fell below the level of the vocal folds and the pt demonstrated an immediate cough in attempt to eject the aspirate. The pt also demonstrated silent aspiration of thin liquid while attempting a chin tuck. No instances of penetration/aspiration occurred with any other POs. The pt demonstrates significant pharyngeal residue across POs in the valleculae, lateral channels, and pyriforms. In order to address pharyngeal residue, multiple swallows (3) were utilized. Additional compensatory strategies were attempted to address residue but were unsuccessful. Recommend dys 3 diet with nectar thick liquids and use of multiple swallows. SLP will continue to f/u acutely for diet toleration and advancement. SLP Visit Diagnosis Dysphagia, oropharyngeal phase (R13.12) Attention and concentration deficit following -- Frontal lobe and executive function deficit following -- Impact on safety and function Moderate aspiration risk   CHL IP TREATMENT RECOMMENDATION 04/29/2020 Treatment Recommendations Therapy as outlined in treatment plan below   Prognosis 04/29/2020 Prognosis for Safe Diet Advancement Good Barriers to Reach Goals Time Glass onset Barriers/Prognosis Comment -- CHL IP DIET RECOMMENDATION 04/29/2020 SLP Diet Recommendations Dysphagia 3 (Mech soft) solids;Nectar thick liquid Liquid Administration via Cup Medication Administration Crushed with puree Compensations Multiple dry swallows after each bite/sip  Postural Changes Seated upright at 90 degrees   CHL IP OTHER RECOMMENDATIONS 04/29/2020 Recommended Consults -- Oral Care Recommendations Oral care BID Other Recommendations --   No flowsheet data found.  CHL IP FREQUENCY AND DURATION 04/29/2020 Speech Therapy Frequency (ACUTE ONLY) min 2x/week Treatment Duration 2 weeks      CHL IP ORAL PHASE 04/29/2020 Oral Phase Impaired Oral - Pudding Teaspoon -- Oral - Pudding Cup -- Oral - Honey Teaspoon -- Oral - Honey Cup -- Oral - Nectar Teaspoon -- Oral - Nectar Cup -- Oral - Nectar Straw -- Oral - Thin Teaspoon -- Oral - Thin Cup Premature spillage;Delayed oral transit Oral - Thin Straw Premature spillage;Delayed oral transit Oral - Puree WFL Oral - Mech Soft -- Oral - Regular WFL Oral - Multi-Consistency -- Oral - Pill -- Oral Phase - Comment --  CHL IP PHARYNGEAL PHASE 04/29/2020 Pharyngeal Phase Impaired Pharyngeal- Pudding Teaspoon -- Pharyngeal -- Pharyngeal- Pudding Cup -- Pharyngeal -- Pharyngeal- Honey Teaspoon -- Pharyngeal -- Pharyngeal- Honey Cup -- Pharyngeal -- Pharyngeal- Nectar Teaspoon -- Pharyngeal -- Pharyngeal- Nectar Cup -- Pharyngeal -- Pharyngeal- Nectar Straw Pharyngeal residue - valleculae;Pharyngeal residue - pyriform;Reduced anterior laryngeal mobility;Reduced laryngeal elevation;Lateral channel residue;Compensatory strategies attempted (with notebox) Pharyngeal -- Pharyngeal- Thin Teaspoon -- Pharyngeal -- Pharyngeal- Thin Cup Reduced anterior laryngeal mobility;Reduced laryngeal elevation;Pharyngeal residue - valleculae;Lateral channel residue;Pharyngeal residue - pyriform;Penetration/Aspiration before swallow;Trace aspiration Pharyngeal Material enters airway, remains ABOVE vocal cords then ejected out Pharyngeal- Thin  Straw Lateral channel residue;Pharyngeal residue - pyriform;Pharyngeal residue - valleculae;Reduced anterior laryngeal mobility;Reduced laryngeal elevation;Penetration/Aspiration during swallow;Trace aspiration Pharyngeal  Material enters airway, remains ABOVE vocal cords then ejected out Pharyngeal- Puree Lateral channel residue;Pharyngeal residue - pyriform;Pharyngeal residue - valleculae;Reduced laryngeal elevation;Reduced anterior laryngeal mobility Pharyngeal -- Pharyngeal- Mechanical Soft -- Pharyngeal -- Pharyngeal- Regular Reduced anterior laryngeal mobility;Reduced laryngeal elevation;Pharyngeal residue - valleculae;Pharyngeal residue - pyriform;Lateral channel residue Pharyngeal -- Pharyngeal- Multi-consistency -- Pharyngeal -- Pharyngeal- Pill -- Pharyngeal -- Pharyngeal Comment --  CHL IP CERVICAL ESOPHAGEAL PHASE 04/29/2020 Cervical Esophageal Phase Impaired Pudding Teaspoon -- Pudding Cup -- Honey Teaspoon -- Honey Cup -- Nectar Teaspoon -- Nectar Cup -- Nectar Straw -- Thin Teaspoon -- Thin Cup -- Thin Straw -- Puree -- Mechanical Soft -- Regular -- Multi-consistency -- Pill -- Cervical Esophageal Comment -- Claudine Mouton 04/29/2020, 10:44 AM              ECHOCARDIOGRAM COMPLETE  Result Date: 04/28/2020    ECHOCARDIOGRAM REPORT   Patient Name:   Tyler Glass Date of Exam: 04/28/2020 Medical Rec #:  [id number]    Height:       73.0 in Accession #:    [id number]   Weight:       185.0 lb Date of Birth:  10/14/1933    BSA:          2.082 m Patient Age:    86 years     BP:           125/80 mmHg Patient Gender: M            HR:           80 bpm. Exam Location:  Inpatient Procedure: 2D Echo Indications:    stroke 434.91  History:        Patient has no prior history of Echocardiogram examinations.                 Risk Factors:Hypertension.  Sonographer:    Celene Skeen RDCS (AE) Referring Phys: [phone] DAVID GIRGUIS IMPRESSIONS  1. Left ventricular ejection fraction, by estimation, is 60 to 65%. The left ventricle has normal function. The left ventricle has no regional wall motion abnormalities. Left ventricular diastolic parameters are consistent with Grade I diastolic dysfunction (impaired relaxation).  2. Right  ventricular systolic function is normal. The right ventricular size is normal.  3. The mitral valve is normal in structure. No evidence of mitral valve regurgitation. No evidence of mitral stenosis.  4. The aortic valve is normal in structure. Aortic valve regurgitation is not visualized. No aortic stenosis is present.  5. The inferior vena cava is normal in size with greater than 50% respiratory variability, suggesting right atrial pressure of 3 mmHg. FINDINGS  Left Ventricle: Left ventricular ejection fraction, by estimation, is 60 to 65%. The left ventricle has normal function. The left ventricle has no regional wall motion abnormalities. The left ventricular internal cavity size was normal in size. There is  no left ventricular hypertrophy. Left ventricular diastolic parameters are consistent with Grade I diastolic dysfunction (impaired relaxation). Normal left ventricular filling pressure. Right Ventricle: The right ventricular size is normal. No increase in right ventricular wall thickness. Right ventricular systolic function is normal. Left Atrium: Left atrial size was normal in size. Right Atrium: Right atrial size was normal in size. Pericardium: There is no evidence of pericardial effusion. Mitral Valve: The mitral valve is normal in structure. No evidence of mitral valve regurgitation. No evidence of mitral valve  stenosis. Tricuspid Valve: The tricuspid valve is normal in structure. Tricuspid valve regurgitation is not demonstrated. No evidence of tricuspid stenosis. Aortic Valve: The aortic valve is normal in structure. Aortic valve regurgitation is not visualized. No aortic stenosis is present. Pulmonic Valve: The pulmonic valve was normal in structure. Pulmonic valve regurgitation is not visualized. No evidence of pulmonic stenosis. Aorta: The aortic root is normal in size and structure. Venous: The inferior vena cava is normal in size with greater than 50% respiratory variability, suggesting right  atrial pressure of 3 mmHg. IAS/Shunts: No atrial level shunt detected by color flow Doppler.  LEFT VENTRICLE PLAX 2D LVIDd:         3.60 cm  Diastology LVIDs:         2.30 cm  LV e' medial:    3.05 cm/s LV PW:         1.10 cm  LV E/e' medial:  15.3 LV IVS:        0.90 cm  LV e' lateral:   7.29 cm/s LVOT diam:     2.10 cm  LV E/e' lateral: 6.4 LV SV:         47 LV SV Index:   23 LVOT Area:     3.46 cm  RIGHT VENTRICLE RV S prime:     12.50 cm/s TAPSE (M-mode): 1.6 cm LEFT ATRIUM           Index LA diam:      2.90 cm 1.39 cm/m LA Vol (A2C): 25.6 ml 12.30 ml/m  AORTIC VALVE LVOT Vmax:   64.60 cm/s LVOT Vmean:  51.900 cm/s LVOT VTI:    0.136 m  AORTA Ao Root diam: 3.20 cm MITRAL VALVE MV Area (PHT): 2.73 cm    SHUNTS MV Decel Time: 278 msec    Systemic VTI:  0.14 m MV E velocity: 46.70 cm/s  Systemic Diam: 2.10 cm MV A velocity: 61.30 cm/s MV E/A ratio:  0.76 Mihai Croitoru MD Electronically signed by Thurmon Fair MD Signature Date/Time: 04/28/2020/2:42:34 PM    Final

## 2020-04-30 NOTE — H&P (Signed)
Physical Medicine and Rehabilitation Admission H&P    Chief Complaint  Patient presents with  . Cerebrovascular Accident  : HPI: Tyler Glass is an 86 year old right-handed male with history of hypertension.  History taken from chart review and patient due to dysarthria and cognition.  Patient lives in [place], but plans to live with his daughter, who works, at discharge.  Two-level home half bath on main level.  Reportedly independent prior to admission.  Up until recently patient been living in [place] alone driving doing his own grocery shopping and yard work.  He has a daughter here in the area that works.  He presented on 04/28/2020 with right hemiparesis and dysarthria.  Head CT unremarkable for acute intracranial process.  Patient did not receive TPA.  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  MRA angiography does not show significant carotid bifurcation disease.  Admission chemistries glucose 172, creatinine 1.44, alcohol negative urine drug screen negative, hemoglobin A1c of 6.  Echocardiogram with ejection fraction of 60-65%, no wall motion abnormalities grade 1 diastolic dysfunction.  Currently maintained on aspirin and Plavix for CVA prophylaxis x3 weeks and aspirin alone.  Subcutaneous Lovenox for DVT prophylaxis.  Hospital course further complicated by postop dysphagia, currently on a dysphagia #3 nectar thick liquid diet.  Therapy evaluations completed and patient was admitted for a comprehensive rehab program.  Please see preadmission assessment from earlier today as well.  Review of Systems  Constitutional: Negative for chills, fever and malaise/fatigue.  HENT: Negative for hearing loss.   Eyes: Negative for blurred vision and double vision.  Respiratory: Negative for cough.   Cardiovascular: Negative for chest pain, palpitations and leg swelling.  Gastrointestinal: Positive for constipation. Negative for  heartburn, nausea and vomiting.  Genitourinary: Positive for urgency. Negative for dysuria, flank pain and hematuria.  Skin: Negative for rash.  Neurological: Positive for speech change. Negative for focal weakness and weakness.  All other systems reviewed and are negative.  Past Medical History:  Diagnosis Date  . Hypertension    History reviewed. No pertinent surgical history related to CVA. No pertinent family history of premature CVA. Social History:  reports that he has never smoked. He has never used smokeless tobacco. He reports current alcohol use. No history on file for drug use. Allergies: No Known Allergies Medications Prior to Admission  Medication Sig Dispense Refill  . acetaminophen (TYLENOL) 500 MG tablet Take 1,000 mg by mouth every 6 (six) hours as needed for mild pain.     Marland Kitchen amLODipine (NORVASC) 2.5 MG tablet Take 2.5 mg by mouth daily.     Marland Kitchen atorvastatin (LIPITOR) 40 MG tablet Take 40 mg by mouth daily.     Marland Kitchen lisinopril-hydrochlorothiazide (ZESTORETIC) 10-12.5 MG tablet Take 1 tablet by mouth daily.      Drug Regimen Review Drug regimen was reviewed and remains appropriate with no significant issues identified  Home: Home Living Family/patient expects to be discharged to:: Private residence Living Arrangements: Children (with daughter (she brought him from [facility])) Available Help at Discharge: Family Type of Home: House Home Access: Stairs to enter Secretary/administrator of Steps: 1 (threshold) Home Layout: Two level, 1/2 bath on main level (pt stayed on 1st floor ) Alternate Level Stairs-Number of Steps: flight Bathroom Shower/Tub: Other (comment) (upstairs in dtr's home; pt unsure type)   Functional History: Prior Function Level of Independence: Independent Comments: lived alone in [place]; driving, grocery shopping, yard work; daughter brought him  to [place] after discharged by [facility]  Functional Status:  Mobility: Bed Mobility Overal bed mobility: Needs  Assistance Bed Mobility: Supine to Sit Rolling: Supervision Sidelying to sit: Mod assist, HOB elevated Supine to sit: Mod assist, HOB elevated Sit to sidelying: Min assist General bed mobility comments: +rail, increased time, cues for sequencing Transfers Overall transfer level: Needs assistance Equipment used: 1 person hand held assist Transfers: Sit to/from Stand, Stand Pivot Transfers Sit to Stand: Mod assist Stand pivot transfers: Mod assist  Lateral/Scoot Transfers: Min assist General transfer comment: assist to power up and stabilize balance. R lateral lean. Cues for sequencing Ambulation/Gait General Gait Details: pre-gait activities in stance. Lateral weight shifts. Finding midline.    ADL: ADL Overall ADL's : Needs assistance/impaired Eating/Feeding: Sitting, Moderate assistance Eating/Feeding Details (indicate cue type and reason): will attempt modified utensils - balancing weight with increased handles Grooming: Moderate assistance, Sitting Grooming Details (indicate cue type and reason): RUE (dominant) ataxic and overall weak impacting ability to interact and use functional ADL tools - during oral care wash cloth used to build up handle of toothbrush and assist to guide to mouth Upper Body Bathing: Moderate assistance Lower Body Bathing: Moderate assistance Upper Body Dressing : Moderate assistance Lower Body Dressing: Moderate assistance, Maximal assistance, Sit to/from stand Toilet Transfer: Maximal assistance, Tax adviser Details (indicate cue type and reason): face to face transfer using gait belt, asisst for body position in space and boost to turn Toileting- Clothing Manipulation and Hygiene: Maximal assistance Functional mobility during ADLs: Moderate assistance, Maximal assistance (SPT only this session, for true ambulation will require +2) General ADL Comments: Pt very motivated and eager for therapy  Cognition: Cognition Overall Cognitive  Status: Within Functional Limits for tasks assessed Arousal/Alertness: Awake/alert Orientation Level: Oriented to person, Oriented to place, Oriented to situation (Date: May 02, 2020; Day: Thurs) Attention: Focused, Sustained Focused Attention: Impaired Focused Attention Impairment: Verbal complex Sustained Attention: Impaired Sustained Attention Impairment: Verbal complex Memory: Impaired Memory Impairment: Storage deficit, Retrieval deficit, Decreased recall of new information (Immediate: 4/5; delayed: 0/5; with cues: 2/5) Awareness: Appears intact Problem Solving: Impaired Problem Solving Impairment: Verbal complex (3/4) Executive Function: Reasoning, Sequencing, Organizing Sequencing: Impaired Sequencing Impairment: Verbal complex (Clock drawing: 0/4) Organizing: Impaired Organizing Impairment: Verbal complex (Backward digit span: 2/3) Cognition Arousal/Alertness: Awake/alert Behavior During Therapy: WFL for tasks assessed/performed Overall Cognitive Status: Within Functional Limits for tasks assessed General Comments: o x4  Physical Exam: Blood pressure 113/81, pulse 81, temperature 97.6 F (36.4 C), temperature source Oral, resp. rate 14, height 6\' 1"  (1.854 m), weight 83.9 kg, SpO2 98 %. Physical Exam Vitals reviewed.  Constitutional:      General: He is not in acute distress. HENT:     Head: Normocephalic and atraumatic.     Right Ear: External ear normal.     Left Ear: External ear normal.     Nose: Nose normal.  Eyes:     General:        Right eye: No discharge.        Left eye: No discharge.     Extraocular Movements: Extraocular movements intact.  Cardiovascular:     Rate and Rhythm: Normal rate and regular rhythm.  Pulmonary:     Effort: Pulmonary effort is normal. No respiratory distress.     Breath sounds: No stridor.  Abdominal:     General: Abdomen is flat. Bowel sounds are normal. There is no distension.  Musculoskeletal:     Cervical back: Normal  range of motion and neck supple.     Comments: No edema or tenderness in extremities  Skin:    General: Skin is warm and dry.  Neurological:     Mental Status: He is alert and oriented to person, place, and time.     Comments: Alert Makes eye contact with examiner.   Follows simple commands.   Moderate dysarthria Motor: 5/5 throughout Mild facial weakness  Psychiatric:        Speech: Speech is slurred and tangential.        Cognition and Memory: Memory is impaired.     Results for orders placed or performed during the hospital encounter of 04/27/20 (from the past 48 hour(s))  Glucose, capillary     Status: Abnormal   Collection Time: 05/01/20 12:07 PM  Result Value Ref Range   Glucose-Capillary 107 (H) 70 - 99 mg/dL    Comment: Glucose reference range applies only to samples taken after fasting for at least 8 hours.   No results found.     Medical Problem List and Plan: 1.  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  Antithrombotics: -DVT/anticoagulation:  Lovenox  -antiplatelet therapy: Aspirin 81 mg daily, Plavix 75 mg daily x3 weeks and aspirin alone 3. Pain Management: Tylenol as needed 4. Mood: Provide emotional support  -antipsychotic agents: N/A 5. Neuropsych: This patient is not fully capable of making decisions on his own behalf. 6. Skin/Wound Care: Routine skin checks 7. Fluids/Electrolytes/Nutrition: Routine in and outs  CMP ordered. 8.  Dysphagia.  Dysphagia #3 nectar liquids.  Advance diet as tolerated 9.  Permissive hypertension.  Patient on Norvasc 2.5 mg daily and Zestoretic 10-12.5 mg daily prior to admission.  Resume as needed  Monitor with increased mobility 10.  Hyperlipidemia: Lipitor   Charlton Amor, PA-C 05/01/2020  I have personally performed a face to face diagnostic evaluation, including, but not limited to  relevant history and physical exam findings, of this patient and developed relevant assessment and plan.  Additionally, I have reviewed and concur with the physician assistant's documentation above.  Maryla Morrow, MD, ABPMR

## 2020-04-30 NOTE — Progress Notes (Signed)
Physical Therapy Treatment Patient Details Name: Tyler Glass MRN: [id number] DOB: 10/09/1933 Today's Date: 04/30/2020    History of Present Illness 86 y.o. male with PMH significant for HTN who presents with right sided weakness, unsteady gait and difficulty with speech and trouble swallowing and persistent hiccups--woke up with these on 04/25/20. He was seen at [facility] and discharged. 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    PT Comments    Pt progressing well with mobility. He required mod assist bed mobility and mod assist transfers. Session focused on maintaining midline in sitting and stance as pt presenting with R lateral lean. Performed weight shifting in stand with RW and reaching activities outside BOS in sitting. Alternating toe tapping and marching in sit to address coordination. Daughter present and engaged during session. Pt in recliner with feet elevated at end of session.    Follow Up Recommendations  CIR     Equipment Recommendations  Other (comment) (TBA next venue)    Recommendations for Other Services       Precautions / Restrictions Precautions Precautions: Fall    Mobility  Bed Mobility Overal bed mobility: Needs Assistance     Sidelying to sit: Mod assist;HOB elevated       General bed mobility comments: +rail, increased time  Transfers Overall transfer level: Needs assistance   Transfers: Stand Pivot Transfers;Sit to/from Stand Sit to Stand: Mod assist Stand pivot transfers: Mod assist       General transfer comment: assist to power up and stabilize balance. R lateral lean  Ambulation/Gait             General Gait Details: pre-gait activities in stance. Lateral weight shifts. Finding midline.   Stairs             Wheelchair Mobility    Modified Rankin (Stroke Patients Only) Modified Rankin (Stroke Patients  Only) Pre-Morbid Rankin Score: No symptoms Modified Rankin: Severe disability     Balance Overall balance assessment: Needs assistance Sitting-balance support: Feet supported;Single extremity supported Sitting balance-Leahy Scale: Fair Sitting balance - Comments: rt lean; with time able to maintain midline with LUE support Postural control: Right lateral lean   Standing balance-Leahy Scale: Poor Standing balance comment: right lean, mod assist to correct in static stand with RW                            Cognition Arousal/Alertness: Awake/alert Behavior During Therapy: WFL for tasks assessed/performed Overall Cognitive Status: Within Functional Limits for tasks assessed                                        Exercises General Exercises - Lower Extremity Ankle Circles/Pumps: AROM;Both;20 reps;Seated Hip Flexion/Marching: AROM;Right;Left;20 reps;Seated    General Comments General comments (skin integrity, edema, etc.): Daughter present and engaged in session.      Pertinent Vitals/Pain Pain Assessment: No/denies pain    Home Living                      Prior Function            PT Goals (current goals can now be found in the care plan section) Acute Rehab PT Goals Patient Stated Goal: rehab then home Progress towards PT goals: Progressing toward goals  Frequency    Min 4X/week      PT Plan Current plan remains appropriate    Co-evaluation              AM-PAC PT "6 Clicks" Mobility   Outcome Measure  Help needed turning from your back to your side while in a flat bed without using bedrails?: None Help needed moving from lying on your back to sitting on the side of a flat bed without using bedrails?: A Lot Help needed moving to and from a bed to a chair (including a wheelchair)?: A Lot Help needed standing up from a chair using your arms (e.g., wheelchair or bedside chair)?: A Lot Help needed to walk in hospital  room?: Total Help needed climbing 3-5 steps with a railing? : Total 6 Click Score: 12    End of Session Equipment Utilized During Treatment: Gait belt Activity Tolerance: Patient tolerated treatment well Patient left: in chair;with call bell/phone within reach;with family/visitor present Nurse Communication: Mobility status PT Visit Diagnosis: Ataxic gait (R26.0);Other symptoms and signs involving the nervous system (R29.898);Hemiplegia and hemiparesis Hemiplegia - Right/Left: Right Hemiplegia - dominant/non-dominant: Dominant Hemiplegia - caused by: Cerebral infarction     Time: 1108-1140 PT Time Calculation (min) (ACUTE ONLY): 32 min  Charges:  $Therapeutic Activity: 8-22 mins $Neuromuscular Re-education: 8-22 mins                     Aida Raider, PT  Office # [phone] Pager [phone]    Ilda Foil 04/30/2020, 12:53 PM

## 2020-04-30 NOTE — PMR Pre-admission (Addendum)
PMR Admission Coordinator Pre-Admission Assessment   Patient: Tyler Glass is an 86 y.o., male MRN: [id number] DOB: 01/18/1934 Height: 6' 1" (185.4 cm) Weight: 83.9 kg                                                                                                                                                  Insurance Information HMO: yes    PPO:      PCP:      IPA:      80/20:      OTHER:  PRIMARY: [employer]      Policy#: [id number]      Subscriber: pt CM Name: Angelica      Phone#: [phone]   Fax#: [phone] Pre-Cert#: [id number] auth for CIR provided by Angelica with [employer] with updates due to fax listed above on 11/18.      Employer:  Benefits:  Phone #: [phone]     Name:  Eff. Date: 06/21/19     Deduct: $0      Out of Pocket Max: $7550 ($0 met)      Life Max: n/a  CIR: $298/day for days 1-7      SNF: 100% Outpatient:      Co-Pay: $25-$40/visit Home Health: 100%      Co-Pay:  DME: 80%     Co-Ins: 20% Providers:  SECONDARY:       Policy#:       Phone#:    Financial Counselor:       Phone#:    The "Data Collection Information Summary" for patients in Inpatient Rehabilitation Facilities with attached "Privacy Act Statement-Health Care Records" was provided and verbally reviewed with: Patient and Family   Emergency Contact Information Contact Information       Name Relation Home Work Mobile    Baxter,Aliya Daughter     [phone]         Current Medical History  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 right-handed male with history of hypertension.  He presented to [facility] on 04/27/2020 with right hemiparesis and dysarthria. Cranial CT unremarkable for acute intracranial process. Patient did not receive TPA.  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 Transfer to [place] for further workup.  MRA  angiography does not show significant carotid bifurcation disease.  Admission chemistries glucose 172, creatinine 1.44, alcohol negative, urine drug screen negative, hemoglobin A1c 6.0.  Echocardiogram with EF of 60-65%, no wall motion abnormalities grade 1 diastolic dysfunction.  Currently maintained on aspirin and Plavix for CVA prophylaxis x3 weeks then aspirin alone.  Subcutaneous Lovenox for DVT prophylaxis.  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  Started on dysphagia #3 nectar thick liquids.  Therapy evaluations completed with recommendations of   CIR.   Complete NIHSS TOTAL: 2   Past Medical History      Past Medical History:  Diagnosis Date  . Hypertension        Family History  family history is not on file.   Prior Rehab/Hospitalizations:  Has the patient had prior rehab or hospitalizations prior to admission? No; [facility] [place] [facility], but does not appear to have been admitted.    Has the patient had major surgery during 100 days prior to admission? No   Current Medications    Current Facility-Administered Medications:  .  acetaminophen (TYLENOL) tablet 650 mg, 650 mg, Oral, Q4H PRN **OR** [DISCONTINUED] acetaminophen (TYLENOL) 160 MG/5ML solution 650 mg, 650 mg, Per Tube, Q4H PRN **OR** [DISCONTINUED] acetaminophen (TYLENOL) suppository 650 mg, 650 mg, Rectal, Q4H PRN, Girguis, David, MD .  aspirin EC tablet 81 mg, 81 mg, Oral, Daily, [name], [name], NP, 81 mg at 05/01/20 1034 .  atorvastatin (LIPITOR) tablet 80 mg, 80 mg, Oral, Daily, Singh, Prashant K, MD, 80 mg at 05/01/20 1034 .  chlorproMAZINE (THORAZINE) tablet 12.5 mg, 12.5 mg, Oral, TID, [name], [name], NP, 12.5 mg at 05/01/20 1033 .  clopidogrel (PLAVIX) tablet 75 mg, 75 mg, Oral, Daily, Khaliqdina, Salman, MD, 75 mg at 05/01/20 1034 .  enoxaparin (LOVENOX) injection 40 mg, 40 mg, Subcutaneous, Daily, Girguis, David, MD, 40 mg at 05/01/20 1034 .  hydrALAZINE (APRESOLINE)  injection 10 mg, 10 mg, Intravenous, Q4H PRN, Girguis, David, MD .  influenza vaccine adjuvanted (FLUAD) injection 0.5 mL, 0.5 mL, Intramuscular, Tomorrow-1000, Singh, Prashant K, MD .  labetalol (NORMODYNE) injection 10 mg, 10 mg, Intravenous, Q4H PRN, Girguis, David, MD .  MEDLINE mouth rinse, 15 mL, Mouth Rinse, BID, Singh, Prashant K, MD, 15 mL at 05/01/20 1114 .  pantoprazole (PROTONIX) EC tablet 40 mg, 40 mg, Oral, Daily, Singh, Prashant K, MD, 40 mg at 05/01/20 1034 .  Resource ThickenUp Clear, , Oral, PRN, Singh, Prashant K, MD   Patients Current Diet:  Diet Order                  DIET DYS 3 Room service appropriate? No; Fluid consistency: Nectar Thick  Diet effective now                         Precautions / Restrictions Precautions Precautions: Fall Precaution Comments: pt denies falling prior to or since CVA Restrictions Weight Bearing Restrictions: No    Has the patient had 2 or more falls or a fall with injury in the past year?No   Prior Activity Level Community (5-7x/wk): fully independent, driving, working in the yard, no DME used prior to admission (lived independently in [place])   Prior Functional Level Prior Function Level of Independence: Independent Comments: lived alone in [place]; driving, grocery shopping, yard work; daughter brought him [facility] [place] [facility]   Self Care: Did the patient need help bathing, dressing, using the toilet or eating?  Independent   Indoor Mobility: Did the patient need assistance with walking from room to room (with or without device)? Independent   Stairs: Did the patient need assistance with internal or external stairs (with or without device)? Independent   Functional Cognition: Did the patient need help planning regular tasks such as shopping or remembering to take medications? Independent   Home Assistive Devices / Equipment Home Assistive Devices/Equipment: None   Prior Device Use: Indicate devices/aids used by the    patient prior to current illness, exacerbation or injury? None of the above   Current Functional Level Cognition   Arousal/Alertness: Awake/alert Overall Cognitive Status: Within Functional Limits for tasks assessed Orientation Level: Oriented to person, Oriented to place, Oriented to situation (Date: May 02, 2020; Day: Thurs) General Comments: o x4 Attention: Focused, Sustained Focused Attention: Impaired Focused Attention Impairment: Verbal complex Sustained Attention: Impaired Sustained Attention Impairment: Verbal complex Memory: Impaired Memory Impairment: Storage deficit, Retrieval deficit, Decreased recall of new information (Immediate: 4/5; delayed: 0/5; with cues: 2/5) Awareness: Appears intact Problem Solving: Impaired Problem Solving Impairment: Verbal complex (3/4) Executive Function: Reasoning, Sequencing, Organizing Sequencing: Impaired Sequencing Impairment: Verbal complex (Clock drawing: 0/4) Organizing: Impaired Organizing Impairment: Verbal complex (Backward digit span: 2/3)    Extremity Assessment (includes Sensation/Coordination)   Upper Extremity Assessment: RUE deficits/detail RUE Deficits / Details: grossly 3/5 strength, movements very ataxic from shoulder RUE Sensation: decreased light touch, decreased proprioception RUE Coordination: decreased fine motor, decreased gross motor  Lower Extremity Assessment: RLE deficits/detail RLE Deficits / Details: limited assessment with lesser ataxia than RUE noted; strength grossly 4/5 RLE Sensation:  (stated RLE some numbness (?accuracy)) RLE Coordination: decreased gross motor     ADLs   Overall ADL's : Needs assistance/impaired Eating/Feeding: Sitting, Moderate assistance Eating/Feeding Details (indicate cue type and reason): will attempt modified utensils - balancing weight with increased handles Grooming: Moderate assistance, Sitting Grooming Details (indicate cue type and reason): RUE (dominant) ataxic and  overall weak impacting ability to interact and use functional ADL tools - during oral care wash cloth used to build up handle of toothbrush and assist to guide to mouth Upper Body Bathing: Moderate assistance Lower Body Bathing: Moderate assistance Upper Body Dressing : Moderate assistance Lower Body Dressing: Moderate assistance, Maximal assistance, Sit to/from stand Toilet Transfer: Maximal assistance, Stand-pivot Toilet Transfer Details (indicate cue type and reason): face to face transfer using gait belt, asisst for body position in space and boost to turn Toileting- Clothing Manipulation and Hygiene: Maximal assistance Functional mobility during ADLs: Moderate assistance, Maximal assistance (SPT only this session, for true ambulation will require +2) General ADL Comments: Pt very motivated and eager for therapy     Mobility   Overal bed mobility: Needs Assistance Bed Mobility: Supine to Sit Rolling: Supervision Sidelying to sit: Mod assist, HOB elevated Supine to sit: Mod assist, HOB elevated Sit to sidelying: Min assist General bed mobility comments: +rail, increased time, cues for sequencing     Transfers   Overall transfer level: Needs assistance Equipment used: 1 person hand held assist Transfers: Sit to/from Stand, Stand Pivot Transfers Sit to Stand: Mod assist Stand pivot transfers: Mod assist  Lateral/Scoot Transfers: Min assist General transfer comment: assist to power up and stabilize balance. R lateral lean. Cues for sequencing     Ambulation / Gait / Stairs / Wheelchair Mobility   Ambulation/Gait General Gait Details: pre-gait activities in stance. Lateral weight shifts. Finding midline.     Posture / Balance Dynamic Sitting Balance Sitting balance - Comments: rt lean; with time able to maintain midline with LUE support. Balance activities in sitting EOB. Balance Overall balance assessment: Needs assistance Sitting-balance support: Feet supported, Bilateral upper  extremity supported Sitting balance-Leahy Scale: Poor Sitting balance - Comments: rt lean; with time able to maintain midline with LUE support. Balance activities in sitting EOB. Postural control: Right lateral lean Standing balance support: Bilateral upper extremity supported Standing balance-Leahy Scale: Poor Standing balance comment: static stand with mod assist       Special needs/care consideration Designated visitor daughter, Aliya        Previous Home Environment (from acute therapy documentation) Living Arrangements: Children (with daughter (she brought him from [place])) Available Help at Discharge: Family Type of Home: House Home Layout: Two level, 1/2 bath on main level (pt stayed on 1st floor ) Alternate Level Stairs-Number of Steps: flight Home Access: Stairs to enter Entrance Stairs-Number of Steps: 1 (threshold) Bathroom Shower/Tub: Other (comment) (upstairs in dtr's home; pt unsure type) Home Care Services: No   Discharge Living Setting Plans for Discharge Living Setting: Lives with (comment) (daughter Aliya (temporarily since CVA)) Type of Home at Discharge: House Discharge Home Layout: Two level, 1/2 bath on main level (pt has been staying on first floor) Alternate Level Stairs-Number of Steps: full flight Discharge Home Access: Stairs to enter Entrance Stairs-Rails: None Entrance Stairs-Number of Steps: 1 Discharge Bathroom Shower/Tub: Tub/shower unit Discharge Bathroom Toilet: Standard Discharge Bathroom Accessibility: Yes How Accessible: Accessible via walker Does the patient have any problems obtaining your medications?: No   Social/Family/Support Systems Patient Roles: Parent Anticipated Caregiver: Aliya Baxter Anticipated Caregiver's Contact Information: [phone] Ability/Limitations of Caregiver: 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  Felt he was having stroke like symptoms, [facility] [place] [facility] his [facility] met him.  [facility] with [facility] seemingly worsening.  Returned to  with Aliya and went to [facility] and was transferred to [place].  Pt/Family Agrees to Admission and willing to participate: Yes Program Orientation Provided & Reviewed with Pt/Caregiver Including Roles  & Responsibilities: Yes  Barriers to Discharge: Insurance for SNF coverage     Decrease burden of Care through IP rehab admission: n/a   Possible need for SNF placement upon discharge: Not anticipated.  Good family support.    Patient Condition: This patient's condition remains as documented in the consult dated 04/29/2020, in which the Rehabilitation Physician determined and documented that the patient's condition is appropriate for intensive rehabilitative care in an inpatient rehabilitation facility. Will admit to inpatient rehab today.   Preadmission Screen Completed By:  Caitlin E Warren, PT, DPT 05/01/2020 11:16 AM ______________________________________________________________________   Discussed status with Dr. Lisamarie Coke on 05/01/20 at 11:16 AM  and received approval for admission today.   Admission Coordinator:  Caitlin E Warren, PT, DPT time 11:16 AM /Date 05/01/20    

## 2020-04-30 NOTE — Progress Notes (Signed)
  Speech Language Pathology Treatment:    Patient Details Name: Tyler Glass MRN: [id number] DOB: 02-14-1934 Today's Date: 04/30/2020 Time: 4098-1191 SLP Time Calculation (min) (ACUTE ONLY): 9 min  Assessment / Plan / Recommendation Clinical Impression  Pt was seen for dysphagia treatment and was alert and cooperative. He continues to endorse globus sensation during swallowing consistent with MBS findings of significant pharyngeal residue. SLP provided education on findings of MBS and the etiology of his dysphagia. He was observed with nectar thick liquid and tolerated it well without overt s/sx of aspiration. Pt is independently utilizing his compensatory strategy (multiple swallows). SLP will continue to f/u for diet toleration and advancement.    HPI HPI: [name] is a 86 y.o. male with PMH significant for HTN who presents with right sided weakness, unsteady gait and difficulty with speech and trouble swallowing and persistent hiccups. 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 Mild chronic small-vessel ischemic change of the pons and cerebral hemispheric white matter otherwise.      SLP Plan  Continue with current plan of care       Recommendations  Diet recommendations: Dysphagia 3 (mechanical soft);Nectar-thick liquid Liquids provided via: Cup Medication Administration: Via alternative means Supervision: Patient able to self feed;Intermittent supervision to cue for compensatory strategies Compensations: Multiple dry swallows after each bite/sip                Oral Care Recommendations: Oral care BID SLP Visit Diagnosis: Dysphagia, oropharyngeal phase (R13.12) Plan: Continue with current plan of care       GO                Royetta Crochet 04/30/2020, 10:56 AM

## 2020-05-01 ENCOUNTER — Inpatient Hospital Stay (HOSPITAL_COMMUNITY)
Admission: RE | Admit: 2020-05-01 | Discharge: 2020-05-25 | DRG: 057 | Disposition: A | Discharge status: Home Health | Payer: Medicare HMO | Source: Intra-hospital | Attending: Physical Medicine & Rehabilitation | Admitting: Physical Medicine & Rehabilitation

## 2020-05-01 ENCOUNTER — Encounter (HOSPITAL_COMMUNITY): Payer: Self-pay | Admitting: Physical Medicine & Rehabilitation

## 2020-05-01 ENCOUNTER — Other Ambulatory Visit: Payer: Self-pay

## 2020-05-01 DIAGNOSIS — E440 Moderate protein-calorie malnutrition: Secondary | ICD-10-CM | POA: Diagnosis present

## 2020-05-01 DIAGNOSIS — N183 Chronic kidney disease, stage 3 unspecified: Secondary | ICD-10-CM

## 2020-05-01 DIAGNOSIS — I69322 Dysarthria following cerebral infarction: Principal | ICD-10-CM

## 2020-05-01 DIAGNOSIS — M7121 Synovial cyst of popliteal space [Baker], right knee: Secondary | ICD-10-CM | POA: Diagnosis present

## 2020-05-01 DIAGNOSIS — D62 Acute posthemorrhagic anemia: Secondary | ICD-10-CM | POA: Diagnosis present

## 2020-05-01 DIAGNOSIS — Z79899 Other long term (current) drug therapy: Secondary | ICD-10-CM | POA: Diagnosis not present

## 2020-05-01 DIAGNOSIS — M1711 Unilateral primary osteoarthritis, right knee: Secondary | ICD-10-CM | POA: Diagnosis present

## 2020-05-01 DIAGNOSIS — I129 Hypertensive chronic kidney disease with stage 1 through stage 4 chronic kidney disease, or unspecified chronic kidney disease: Secondary | ICD-10-CM | POA: Diagnosis present

## 2020-05-01 DIAGNOSIS — I6330 Cerebral infarction due to thrombosis of unspecified cerebral artery: Secondary | ICD-10-CM | POA: Diagnosis not present

## 2020-05-01 DIAGNOSIS — E8809 Other disorders of plasma-protein metabolism, not elsewhere classified: Secondary | ICD-10-CM

## 2020-05-01 DIAGNOSIS — I69351 Hemiplegia and hemiparesis following cerebral infarction affecting right dominant side: Secondary | ICD-10-CM

## 2020-05-01 DIAGNOSIS — N179 Acute kidney failure, unspecified: Secondary | ICD-10-CM | POA: Diagnosis present

## 2020-05-01 DIAGNOSIS — E785 Hyperlipidemia, unspecified: Secondary | ICD-10-CM | POA: Diagnosis present

## 2020-05-01 DIAGNOSIS — I82442 Acute embolism and thrombosis of left tibial vein: Secondary | ICD-10-CM | POA: Diagnosis present

## 2020-05-01 DIAGNOSIS — I6300 Cerebral infarction due to thrombosis of unspecified precerebral artery: Secondary | ICD-10-CM

## 2020-05-01 DIAGNOSIS — G8929 Other chronic pain: Secondary | ICD-10-CM | POA: Diagnosis present

## 2020-05-01 DIAGNOSIS — X58XXXA Exposure to other specified factors, initial encounter: Secondary | ICD-10-CM | POA: Diagnosis not present

## 2020-05-01 DIAGNOSIS — R7989 Other specified abnormal findings of blood chemistry: Secondary | ICD-10-CM | POA: Diagnosis not present

## 2020-05-01 DIAGNOSIS — I639 Cerebral infarction, unspecified: Secondary | ICD-10-CM | POA: Diagnosis not present

## 2020-05-01 DIAGNOSIS — I69391 Dysphagia following cerebral infarction: Secondary | ICD-10-CM | POA: Diagnosis not present

## 2020-05-01 DIAGNOSIS — N1831 Chronic kidney disease, stage 3a: Secondary | ICD-10-CM | POA: Diagnosis present

## 2020-05-01 DIAGNOSIS — E876 Hypokalemia: Secondary | ICD-10-CM | POA: Diagnosis present

## 2020-05-01 DIAGNOSIS — K5900 Constipation, unspecified: Secondary | ICD-10-CM | POA: Diagnosis present

## 2020-05-01 DIAGNOSIS — I69392 Facial weakness following cerebral infarction: Secondary | ICD-10-CM | POA: Diagnosis not present

## 2020-05-01 DIAGNOSIS — M25561 Pain in right knee: Secondary | ICD-10-CM | POA: Diagnosis present

## 2020-05-01 DIAGNOSIS — R066 Hiccough: Secondary | ICD-10-CM | POA: Diagnosis present

## 2020-05-01 DIAGNOSIS — Z6822 Body mass index (BMI) 22.0-22.9, adult: Secondary | ICD-10-CM | POA: Diagnosis not present

## 2020-05-01 DIAGNOSIS — S76912A Strain of unspecified muscles, fascia and tendons at thigh level, left thigh, initial encounter: Secondary | ICD-10-CM

## 2020-05-01 DIAGNOSIS — R7401 Elevation of levels of liver transaminase levels: Secondary | ICD-10-CM | POA: Diagnosis present

## 2020-05-01 DIAGNOSIS — S39013A Strain of muscle, fascia and tendon of pelvis, initial encounter: Secondary | ICD-10-CM | POA: Diagnosis not present

## 2020-05-01 DIAGNOSIS — Z7901 Long term (current) use of anticoagulants: Secondary | ICD-10-CM

## 2020-05-01 DIAGNOSIS — Z7902 Long term (current) use of antithrombotics/antiplatelets: Secondary | ICD-10-CM

## 2020-05-01 DIAGNOSIS — Z23 Encounter for immunization: Secondary | ICD-10-CM

## 2020-05-01 DIAGNOSIS — I10 Essential (primary) hypertension: Secondary | ICD-10-CM | POA: Diagnosis present

## 2020-05-01 DIAGNOSIS — E46 Unspecified protein-calorie malnutrition: Secondary | ICD-10-CM

## 2020-05-01 DIAGNOSIS — R1312 Dysphagia, oropharyngeal phase: Secondary | ICD-10-CM | POA: Diagnosis present

## 2020-05-01 DIAGNOSIS — S76912S Strain of unspecified muscles, fascia and tendons at thigh level, left thigh, sequela: Secondary | ICD-10-CM | POA: Diagnosis not present

## 2020-05-01 DIAGNOSIS — I63541 Cerebral infarction due to unspecified occlusion or stenosis of right cerebellar artery: Secondary | ICD-10-CM

## 2020-05-01 LAB — CBC
HCT: 41.1 % (ref 39.0–52.0)
Hemoglobin: 13.2 g/dL (ref 13.0–17.0)
MCH: 29.4 pg (ref 26.0–34.0)
MCHC: 32.1 g/dL (ref 30.0–36.0)
MCV: 91.5 fL (ref 80.0–100.0)
Platelets: 309 10*3/uL (ref 150–400)
RBC: 4.49 MIL/uL (ref 4.22–5.81)
RDW: 13.6 % (ref 11.5–15.5)
WBC: 6.1 10*3/uL (ref 4.0–10.5)
nRBC: 0 % (ref 0.0–0.2)

## 2020-05-01 LAB — GLUCOSE, CAPILLARY
Glucose-Capillary: 107 mg/dL — ABNORMAL HIGH (ref 70–99)
Glucose-Capillary: 134 mg/dL — ABNORMAL HIGH (ref 70–99)

## 2020-05-01 LAB — CREATININE, SERUM
Creatinine, Ser: 1.34 mg/dL — ABNORMAL HIGH (ref 0.61–1.24)
GFR, Estimated: 52 mL/min — ABNORMAL LOW (ref >60)

## 2020-05-01 MED ORDER — ENOXAPARIN SODIUM 40 MG/0.4ML ~~LOC~~ SOLN: 40.0000 mg | SUBCUTANEOUS | Status: DC

## 2020-05-01 MED ORDER — ASPIRIN EC 81 MG PO TBEC
81.0000 mg | DELAYED_RELEASE_TABLET | Freq: Every day | ORAL | Status: DC
  Administered 2020-05-02 – 2020-05-08 (×7): 81 mg via ORAL
  Filled 2020-05-01 (×7): qty 1

## 2020-05-01 MED ORDER — PANTOPRAZOLE SODIUM 40 MG PO TBEC
40.0000 mg | DELAYED_RELEASE_TABLET | Freq: Every day | ORAL | Status: DC
  Administered 2020-05-02 – 2020-05-25 (×24): 40 mg via ORAL
  Filled 2020-05-01 (×24): qty 1

## 2020-05-01 MED ORDER — RESOURCE THICKENUP CLEAR PO POWD
ORAL | Status: DC
Start: 2020-05-01 — End: 2020-05-25

## 2020-05-01 MED ORDER — ATORVASTATIN CALCIUM 80 MG PO TABS
80.0000 mg | ORAL_TABLET | Freq: Every day | ORAL | Status: DC
  Administered 2020-05-02 – 2020-05-25 (×24): 80 mg via ORAL
  Filled 2020-05-01 (×24): qty 1

## 2020-05-01 MED ORDER — PANTOPRAZOLE SODIUM 40 MG PO TBEC
40.0000 mg | DELAYED_RELEASE_TABLET | Freq: Every day | ORAL | Status: DC
Start: 2020-05-02 — End: 2020-05-25

## 2020-05-01 MED ORDER — PNEUMOCOCCAL VAC POLYVALENT 25 MCG/0.5ML IJ INJ
0.5000 mL | INJECTION | INTRAMUSCULAR | Status: AC
  Administered 2020-05-02: 0.5 mL via INTRAMUSCULAR
  Filled 2020-05-01: qty 0.5

## 2020-05-01 MED ORDER — ATORVASTATIN CALCIUM 80 MG PO TABS
80.0000 mg | ORAL_TABLET | Freq: Every day | ORAL | Status: DC
Start: 2020-05-02 — End: 2020-05-25

## 2020-05-01 MED ORDER — CHLORPROMAZINE HCL 25 MG PO TABS
12.5000 mg | ORAL_TABLET | Freq: Three times a day (TID) | ORAL | Status: DC
  Administered 2020-05-01 – 2020-05-19 (×52): 12.5 mg via ORAL
  Filled 2020-05-01 (×54): qty 1

## 2020-05-01 MED ORDER — RESOURCE THICKENUP CLEAR PO POWD
ORAL | Status: DC | PRN
  Filled 2020-05-01: qty 125

## 2020-05-01 MED ORDER — CLOPIDOGREL BISULFATE 75 MG PO TABS
75.0000 mg | ORAL_TABLET | Freq: Every day | ORAL | Status: DC
Start: 2020-05-02 — End: 2020-05-25

## 2020-05-01 MED ORDER — ASPIRIN 81 MG PO TBEC
81.0000 mg | DELAYED_RELEASE_TABLET | Freq: Every day | ORAL | 11 refills | Status: DC
Start: 2020-05-02 — End: 2020-05-25

## 2020-05-01 MED ORDER — ACETAMINOPHEN 325 MG PO TABS
650.0000 mg | ORAL_TABLET | ORAL | Status: DC | PRN
  Administered 2020-05-06 – 2020-05-19 (×6): 650 mg via ORAL
  Filled 2020-05-01 (×6): qty 2

## 2020-05-01 MED ORDER — CLOPIDOGREL BISULFATE 75 MG PO TABS
75.0000 mg | ORAL_TABLET | Freq: Every day | ORAL | Status: DC
  Administered 2020-05-02 – 2020-05-08 (×7): 75 mg via ORAL
  Filled 2020-05-01 (×7): qty 1

## 2020-05-01 MED ORDER — ENOXAPARIN SODIUM 40 MG/0.4ML ~~LOC~~ SOLN
40.0000 mg | Freq: Every day | SUBCUTANEOUS | Status: DC
  Administered 2020-05-02 – 2020-05-08 (×7): 40 mg via SUBCUTANEOUS
  Filled 2020-05-01 (×7): qty 0.4

## 2020-05-01 MED ORDER — CHLORPROMAZINE HCL 25 MG PO TABS: 25.0000 mg | ORAL_TABLET | Freq: Three times a day (TID) | ORAL | Status: DC | PRN

## 2020-05-01 NOTE — Care Management Important Message (Signed)
Important Message  Patient Details  Name: Tyler Glass MRN: [id number] Date of Birth: 30-Jul-1933   Medicare Important Message Given:  Yes - Important Message mailed due to current National Emergency  Verbal consent obtained due to current National Emergency  Relationship to patient: Self Contact Name: Shiraz Bastyr Call Date: 05/01/20  Time: 1000 Phone: [phone] Outcome: No Answer/Busy Important Message mailed to: Patient address on file    [name] [name] 05/01/2020, 10:00 AM

## 2020-05-01 NOTE — Progress Notes (Signed)
PMR Admission Coordinator Pre-Admission Assessment   Patient: Tyler Glass is an 86 y.o., male MRN: [id number] DOB: 10-Apr-1934 Height: _0  (185.4 cm) Weight: 83.9 kg                                                                                                                                                  Insurance Information HMO: yes    PPO:      PCP:      IPA:      80/20:      OTHER:  PRIMARY: [employer]      Policy#: [id number]      Subscriber: pt CM Name: Tyler Glass      Phone#: [phone]   Fax#: [phone] Pre-Cert#: [id number] auth for CIR provided by Tyler Glass with [employer] with updates due to fax listed above on 11/18.      Employer:  Benefits:  Phone #: [phone]     Name:  Eff. Date: 06/21/19     Deduct: $0      Out of Pocket Max: $7550 ($0 met)      Life Max: n/a  CIR: $298/day for days 1-7      SNF: 100% Outpatient:      Co-Pay: $25-$40/visit Home Health: 100%      Co-Pay:  DME: 80%     Co-Ins: 20% Providers:  SECONDARY:       Policy#:       Phone#:    Development worker, community:       Phone#:    The Therapist, art Information Summary" for patients in Inpatient Rehabilitation Facilities with attached "Privacy Act [place] Records" was provided and verbally reviewed with: Patient and Family   Emergency Contact Information Contact Information       Name Relation Home Work Tyler Glass Daughter     [phone]         Current Medical History  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 right-handed male with history of hypertension.  He presented to [facility] on 04/27/2020 with right hemiparesis and dysarthria. Cranial CT unremarkable for acute intracranial process. Patient did not receive TPA.  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 Transfer to [facility] for further workup.  MRA  angiography does not show significant carotid bifurcation disease.  Admission chemistries glucose 172, creatinine 1.44, alcohol negative, urine drug screen negative, hemoglobin A1c 6.0.  Echocardiogram with EF of 60-65%, no wall motion abnormalities grade 1 diastolic dysfunction.  Currently maintained on aspirin and Plavix for CVA prophylaxis x3 weeks then aspirin alone.  Subcutaneous Lovenox for DVT prophylaxis.  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  Started on dysphagia #3 nectar thick liquids.  Therapy evaluations completed with recommendations of  CIR.   Complete NIHSS TOTAL: 2   Past Medical History      Past Medical History:  Diagnosis Date  . Hypertension        Family History  family history is not on file.   Prior Rehab/Hospitalizations:  Has the patient had prior rehab or hospitalizations prior to admission? No; presented to [facility] hospital in [facility] prior to [place], but does not appear to have been admitted.    Has the patient had major surgery during 100 days prior to admission? No   Current Medications    Current Facility-Administered Medications:  .  acetaminophen (TYLENOL) tablet 650 mg, 650 mg, Oral, Q4H PRN **OR** [DISCONTINUED] acetaminophen (TYLENOL) 160 MG/5ML solution 650 mg, 650 mg, Per Tube, Q4H PRN **OR** [DISCONTINUED] acetaminophen (TYLENOL) suppository 650 mg, 650 mg, Rectal, Q4H PRN, Dwyane Dee, MD .  aspirin EC tablet 81 mg, 81 mg, Oral, Daily, [name], [name], NP, 81 mg at 05/01/20 1034 .  atorvastatin (LIPITOR) tablet 80 mg, 80 mg, Oral, Daily, Thurnell Lose, MD, 80 mg at 05/01/20 1034 .  chlorproMAZINE (THORAZINE) tablet 12.5 mg, 12.5 mg, Oral, TID, [name], [name], NP, 12.5 mg at 05/01/20 1033 .  clopidogrel (PLAVIX) tablet 75 mg, 75 mg, Oral, Daily, Donnetta Simpers, MD, 75 mg at 05/01/20 1034 .  enoxaparin (LOVENOX) injection 40 mg, 40 mg, Subcutaneous, Daily, Dwyane Dee, MD, 40 mg at 05/01/20 1034 .  hydrALAZINE (APRESOLINE)  injection 10 mg, 10 mg, Intravenous, Q4H PRN, Dwyane Dee, MD .  influenza vaccine adjuvanted (FLUAD) injection 0.5 mL, 0.5 mL, Intramuscular, Tomorrow-1000, Singh, Prashant K, MD .  labetalol (NORMODYNE) injection 10 mg, 10 mg, Intravenous, Q4H PRN, Dwyane Dee, MD .  MEDLINE mouth rinse, 15 mL, Mouth Rinse, BID, Thurnell Lose, MD, 15 mL at 05/01/20 1114 .  pantoprazole (PROTONIX) EC tablet 40 mg, 40 mg, Oral, Daily, Thurnell Lose, MD, 40 mg at 05/01/20 1034 .  Resource ThickenUp Clear, , Oral, PRN, Thurnell Lose, MD   Patients Current Diet:  Diet Order                  DIET DYS 3 Room service appropriate? No; Fluid consistency: Nectar Thick  Diet effective now                         Precautions / Restrictions Precautions Precautions: Fall Precaution Comments: pt denies falling prior to or since CVA Restrictions Weight Bearing Restrictions: No    Has the patient had 2 or more falls or a fall with injury in the past year?No   Prior Activity Level Community (5-7x/wk): fully independent, driving, working in the yard, no DME used prior to admission (lived independently in [facility])   Prior Functional Level Prior Function Level of Independence: Independent Comments: lived alone in [place]; driving, grocery shopping, yard work; daughter brought him to [facility] after discharged by [facility]: Did the patient need help bathing, dressing, using the toilet or eating?  Independent   Indoor Mobility: Did the patient need assistance with walking from room to room (with or without device)? Independent   Stairs: Did the patient need assistance with internal or external stairs (with or without device)? Independent   Functional Cognition: Did the patient need help planning regular tasks such as shopping or remembering to take medications? Independent   Home Assistive Devices / Equipment Home Assistive Devices/Equipment: None   Prior Device Use: Indicate devices/aids used by the  patient prior to current illness, exacerbation or injury? None of the above   Current Functional Level Cognition   Arousal/Alertness: Awake/alert Overall Cognitive Status: Within Functional Limits for tasks assessed Orientation Level: Oriented to person, Oriented to place, Oriented to situation (Date: May 02, 2020; Day: Thurs) General Comments: o x4 Attention: Focused, Sustained Focused Attention: Impaired Focused Attention Impairment: Verbal complex Sustained Attention: Impaired Sustained Attention Impairment: Verbal complex Memory: Impaired Memory Impairment: Storage deficit, Retrieval deficit, Decreased recall of new information (Immediate: 4/5; delayed: 0/5; with cues: 2/5) Awareness: Appears intact Problem Solving: Impaired Problem Solving Impairment: Verbal complex (3/4) Executive Function: Reasoning, Sequencing, Organizing Sequencing: Impaired Sequencing Impairment: Verbal complex (Clock drawing: 0/4) Organizing: Impaired Organizing Impairment: Verbal complex (Backward digit span: 2/3)    Extremity Assessment (includes Sensation/Coordination)   Upper Extremity Assessment: RUE deficits/detail RUE Deficits / Details: grossly 3/5 strength, movements very ataxic from shoulder RUE Sensation: decreased light touch, decreased proprioception RUE Coordination: decreased fine motor, decreased gross motor  Lower Extremity Assessment: RLE deficits/detail RLE Deficits / Details: limited assessment with lesser ataxia than RUE noted; strength grossly 4/5 RLE Sensation:  (stated RLE some numbness (?accuracy)) RLE Coordination: decreased gross motor     ADLs   Overall ADL's : Needs assistance/impaired Eating/Feeding: Sitting, Moderate assistance Eating/Feeding Details (indicate cue type and reason): will attempt modified utensils - balancing weight with increased handles Grooming: Moderate assistance, Sitting Grooming Details (indicate cue type and reason): RUE (dominant) ataxic and  overall weak impacting ability to interact and use functional ADL tools - during oral care wash cloth used to build up handle of toothbrush and assist to guide to mouth Upper Body Bathing: Moderate assistance Lower Body Bathing: Moderate assistance Upper Body Dressing : Moderate assistance Lower Body Dressing: Moderate assistance, Maximal assistance, Sit to/from stand Toilet Transfer: Maximal assistance, Buyer, retail Details (indicate cue type and reason): face to face transfer using gait belt, asisst for body position in space and boost to turn Toileting- Clothing Manipulation and Hygiene: Maximal assistance Functional mobility during ADLs: Moderate assistance, Maximal assistance (SPT only this session, for true ambulation will require +2) General ADL Comments: Pt very motivated and eager for therapy     Mobility   Overal bed mobility: Needs Assistance Bed Mobility: Supine to Sit Rolling: Supervision Sidelying to sit: Mod assist, HOB elevated Supine to sit: Mod assist, HOB elevated Sit to sidelying: Min assist General bed mobility comments: +rail, increased time, cues for sequencing     Transfers   Overall transfer level: Needs assistance Equipment used: 1 person hand held assist Transfers: Sit to/from Stand, Stand Pivot Transfers Sit to Stand: Mod assist Stand pivot transfers: Mod assist  Lateral/Scoot Transfers: Min assist General transfer comment: assist to power up and stabilize balance. R lateral lean. Cues for sequencing     Ambulation / Gait / Stairs / Wheelchair Mobility   Ambulation/Gait General Gait Details: pre-gait activities in stance. Lateral weight shifts. Finding midline.     Posture / Balance Dynamic Sitting Balance Sitting balance - Comments: rt lean; with time able to maintain midline with LUE support. Balance activities in sitting EOB. Balance Overall balance assessment: Needs assistance Sitting-balance support: Feet supported, Bilateral upper  extremity supported Sitting balance-Leahy Scale: Poor Sitting balance - Comments: rt lean; with time able to maintain midline with LUE support. Balance activities in sitting EOB. Postural control: Right lateral lean Standing balance support: Bilateral upper extremity supported Standing balance-Leahy Scale: Poor Standing balance comment: static stand with mod assist  Special needs/care consideration Designated visitor daughter, Tyler Glass        Previous Home Environment (from acute therapy documentation) Living Arrangements: Children (with daughter (she brought him from [facility])) Available Help at Discharge: Family Type of Home: House Home Layout: Two level, 1/2 bath on main level (pt stayed on 1st floor ) Alternate Level Stairs-Number of Steps: flight Home Access: Stairs to enter Entrance Stairs-Number of Steps: 1 (threshold) Bathroom Shower/Tub: Other (comment) (upstairs in dtr's home; pt unsure type) Perkins: No   Discharge Living Setting Plans for Discharge Living Setting: Lives with (comment) (daughter Tyler Glass (temporarily since CVA)) Type of Home at Discharge: House Discharge Home Layout: Two level, 1/2 bath on main level (pt has been staying on first floor) Alternate Level Stairs-Number of Steps: full flight Discharge Home Access: Stairs to enter Entrance Stairs-Rails: None Entrance Stairs-Number of Steps: 1 Discharge Bathroom Shower/Tub: Tub/shower unit Discharge Bathroom Toilet: Standard Discharge Bathroom Accessibility: Yes How Accessible: Accessible via walker Does the patient have any problems obtaining your medications?: No   Social/Family/Support Systems Patient Roles: Parent Anticipated Caregiver: Press photographer Information: [phone] Ability/Limitations of Caregiver: 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  Felt he was having stroke like symptoms, so went to the [place] hospital in [facility] where his daughter met him.  He was discharged from the [facility] hospital with symptoms seemingly worsening.  Returned to [facility] with Aliya and went to [facility] and was transferred to [facility].  Pt/Family Agrees to Admission and willing to participate: Yes Program Orientation Provided & Reviewed with Pt/Caregiver Including Roles  & Responsibilities: Yes  Barriers to Discharge: Insurance for SNF coverage     Decrease burden of Care through IP rehab admission: n/a   Possible need for SNF placement upon discharge: Not anticipated.  Good family support.    Patient Condition: This patient's condition remains as documented in the consult dated 04/29/2020, in which the Rehabilitation Physician determined and documented that the patient's condition is appropriate for intensive rehabilitative care in an inpatient rehabilitation facility. Will admit to inpatient rehab today.   Preadmission Screen Completed By:  Michel Santee, PT, DPT 05/01/2020 11:16 AM ______________________________________________________________________   Discussed status with Dr. Posey Pronto on 05/01/20 at 11:16 AM  and received approval for admission today.   Admission Coordinator:  Michel Santee, PT, DPT time 11:16 AM Sudie Grumbling 05/01/20

## 2020-05-01 NOTE — H&P (Signed)
Physical Medicine and Rehabilitation Admission H&P    Chief Complaint  Patient presents with  . Cerebrovascular Accident  : HPI: Tyler Glass is an 86 year old right-handed male with history of hypertension.  History taken from chart review and patient due to dysarthria and cognition.  Patient lives in [place], but plans to live with his daughter, who works, at discharge.  Two-level home half bath on main level.  Reportedly independent prior to admission.  Up until recently patient been living in [place] alone driving doing his own grocery shopping and yard work.  He has a daughter here in the area that works.  He presented on 04/28/2020 with right hemiparesis and dysarthria.  Head CT unremarkable for acute intracranial process.  Patient did not receive TPA.  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  MRA angiography does not show significant carotid bifurcation disease.  Admission chemistries glucose 172, creatinine 1.44, alcohol negative urine drug screen negative, hemoglobin A1c of 6.  Echocardiogram with ejection fraction of 60-65%, no wall motion abnormalities grade 1 diastolic dysfunction.  Currently maintained on aspirin and Plavix for CVA prophylaxis x3 weeks and aspirin alone.  Subcutaneous Lovenox for DVT prophylaxis.  Hospital course further complicated by postop dysphagia, currently on a dysphagia #3 nectar thick liquid diet.  Therapy evaluations completed and patient was admitted for a comprehensive rehab program.  Please see preadmission assessment from earlier today as well.  Review of Systems  Constitutional: Negative for chills, fever and malaise/fatigue.  HENT: Negative for hearing loss.   Eyes: Negative for blurred vision and double vision.  Respiratory: Negative for cough.   Cardiovascular: Negative for chest pain, palpitations and leg swelling.  Gastrointestinal: Positive for constipation. Negative for  heartburn, nausea and vomiting.  Genitourinary: Positive for urgency. Negative for dysuria, flank pain and hematuria.  Skin: Negative for rash.  Neurological: Positive for speech change. Negative for focal weakness and weakness.  All other systems reviewed and are negative.  Past Medical History:  Diagnosis Date  . Hypertension    History reviewed. No pertinent surgical history related to CVA. No pertinent family history of premature CVA. Social History:  reports that he has never smoked. He has never used smokeless tobacco. He reports current alcohol use. No history on file for drug use. Allergies: No Known Allergies Medications Prior to Admission  Medication Sig Dispense Refill  . acetaminophen (TYLENOL) 500 MG tablet Take 1,000 mg by mouth every 6 (six) hours as needed for mild pain.     Marland Kitchen amLODipine (NORVASC) 2.5 MG tablet Take 2.5 mg by mouth daily.     Marland Kitchen atorvastatin (LIPITOR) 40 MG tablet Take 40 mg by mouth daily.     Marland Kitchen lisinopril-hydrochlorothiazide (ZESTORETIC) 10-12.5 MG tablet Take 1 tablet by mouth daily.      Drug Regimen Review Drug regimen was reviewed and remains appropriate with no significant issues identified  Home: Home Living Family/patient expects to be discharged to:: Private residence Living Arrangements: Children (with daughter (she brought him from [facility])) Available Help at Discharge: Family Type of Home: House Home Access: Stairs to enter Secretary/administrator of Steps: 1 (threshold) Home Layout: Two level, 1/2 bath on main level (pt stayed on 1st floor ) Alternate Level Stairs-Number of Steps: flight Bathroom Shower/Tub: Other (comment) (upstairs in dtr's home; pt unsure type)   Functional History: Prior Function Level of Independence: Independent Comments: lived alone in [place]; driving, grocery shopping, yard work; daughter brought him  to [place] after discharged by [facility]  Functional Status:  Mobility: Bed Mobility Overal bed mobility: Needs  Assistance Bed Mobility: Supine to Sit Rolling: Supervision Sidelying to sit: Mod assist, HOB elevated Supine to sit: Mod assist, HOB elevated Sit to sidelying: Min assist General bed mobility comments: +rail, increased time, cues for sequencing Transfers Overall transfer level: Needs assistance Equipment used: 1 person hand held assist Transfers: Sit to/from Stand, Stand Pivot Transfers Sit to Stand: Mod assist Stand pivot transfers: Mod assist  Lateral/Scoot Transfers: Min assist General transfer comment: assist to power up and stabilize balance. R lateral lean. Cues for sequencing Ambulation/Gait General Gait Details: pre-gait activities in stance. Lateral weight shifts. Finding midline.    ADL: ADL Overall ADL's : Needs assistance/impaired Eating/Feeding: Sitting, Moderate assistance Eating/Feeding Details (indicate cue type and reason): will attempt modified utensils - balancing weight with increased handles Grooming: Moderate assistance, Sitting Grooming Details (indicate cue type and reason): RUE (dominant) ataxic and overall weak impacting ability to interact and use functional ADL tools - during oral care wash cloth used to build up handle of toothbrush and assist to guide to mouth Upper Body Bathing: Moderate assistance Lower Body Bathing: Moderate assistance Upper Body Dressing : Moderate assistance Lower Body Dressing: Moderate assistance, Maximal assistance, Sit to/from stand Toilet Transfer: Maximal assistance, Tax adviser Details (indicate cue type and reason): face to face transfer using gait belt, asisst for body position in space and boost to turn Toileting- Clothing Manipulation and Hygiene: Maximal assistance Functional mobility during ADLs: Moderate assistance, Maximal assistance (SPT only this session, for true ambulation will require +2) General ADL Comments: Pt very motivated and eager for therapy  Cognition: Cognition Overall Cognitive  Status: Within Functional Limits for tasks assessed Arousal/Alertness: Awake/alert Orientation Level: Oriented to person, Oriented to place, Oriented to situation (Date: May 02, 2020; Day: Thurs) Attention: Focused, Sustained Focused Attention: Impaired Focused Attention Impairment: Verbal complex Sustained Attention: Impaired Sustained Attention Impairment: Verbal complex Memory: Impaired Memory Impairment: Storage deficit, Retrieval deficit, Decreased recall of new information (Immediate: 4/5; delayed: 0/5; with cues: 2/5) Awareness: Appears intact Problem Solving: Impaired Problem Solving Impairment: Verbal complex (3/4) Executive Function: Reasoning, Sequencing, Organizing Sequencing: Impaired Sequencing Impairment: Verbal complex (Clock drawing: 0/4) Organizing: Impaired Organizing Impairment: Verbal complex (Backward digit span: 2/3) Cognition Arousal/Alertness: Awake/alert Behavior During Therapy: WFL for tasks assessed/performed Overall Cognitive Status: Within Functional Limits for tasks assessed General Comments: o x4  Physical Exam: Blood pressure 113/81, pulse 81, temperature 97.6 F (36.4 C), temperature source Oral, resp. rate 14, height 6\' 1"  (1.854 m), weight 83.9 kg, SpO2 98 %. Physical Exam Vitals reviewed.  Constitutional:      General: He is not in acute distress. HENT:     Head: Normocephalic and atraumatic.     Right Ear: External ear normal.     Left Ear: External ear normal.     Nose: Nose normal.  Eyes:     General:        Right eye: No discharge.        Left eye: No discharge.     Extraocular Movements: Extraocular movements intact.  Cardiovascular:     Rate and Rhythm: Normal rate and regular rhythm.  Pulmonary:     Effort: Pulmonary effort is normal. No respiratory distress.     Breath sounds: No stridor.  Abdominal:     General: Abdomen is flat. Bowel sounds are normal. There is no distension.  Musculoskeletal:     Cervical back: Normal  range of motion and neck supple.     Comments: No edema or tenderness in extremities  Skin:    General: Skin is warm and dry.  Neurological:     Mental Status: He is alert and oriented to person, place, and time.     Comments: Alert Makes eye contact with examiner.   Follows simple commands.   Moderate dysarthria Motor: 5/5 throughout Mild facial weakness  Psychiatric:        Speech: Speech is slurred and tangential.        Cognition and Memory: Memory is impaired.     Results for orders placed or performed during the hospital encounter of 04/27/20 (from the past 48 hour(s))  Glucose, capillary     Status: Abnormal   Collection Time: 05/01/20 12:07 PM  Result Value Ref Range   Glucose-Capillary 107 (H) 70 - 99 mg/dL    Comment: Glucose reference range applies only to samples taken after fasting for at least 8 hours.   No results found.     Medical Problem List and Plan: 1.  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  Antithrombotics: -DVT/anticoagulation:  Lovenox  -antiplatelet therapy: Aspirin 81 mg daily, Plavix 75 mg daily x3 weeks and aspirin alone 3. Pain Management: Tylenol as needed 4. Mood: Provide emotional support  -antipsychotic agents: N/A 5. Neuropsych: This patient is not fully capable of making decisions on his own behalf. 6. Skin/Wound Care: Routine skin checks 7. Fluids/Electrolytes/Nutrition: Routine in and outs  CMP ordered. 8.  Dysphagia.  Dysphagia #3 nectar liquids.  Advance diet as tolerated 9.  Permissive hypertension.  Patient on Norvasc 2.5 mg daily and Zestoretic 10-12.5 mg daily prior to admission.  Resume as needed  Monitor with increased mobility 10.  Hyperlipidemia: Lipitor   Charlton Amor, PA-C 05/01/2020  I have personally performed a face to face diagnostic evaluation, including, but not limited to  relevant history and physical exam findings, of this patient and developed relevant assessment and plan.  Additionally, I have reviewed and concur with the physician assistant's documentation above.  Maryla Morrow, MD, ABPMR  The patient's status has not changed. Any changes from the pre-admission screening or documentation from the acute chart are noted above.   Maryla Morrow, MD, ABPMR

## 2020-05-01 NOTE — Progress Notes (Signed)
Physical Medicine and Rehabilitation Consult Reason for Consult: Right side weakness slurred speech Referring Physician: [facility]   HPI: Tyler Glass is a 86 y.o. right-handed male with history of hypertension.  History taken from chart review and patient due to dysarthria.  Patient lives with daughter.  Two-level home half bath on main level.  Reportedly independent prior to admission.  Up until recently patient had been living in [place], [place] alone driving doing his own grocery shopping and yard work.  He has a daughter in the area ?that works.  Daughter picked him up and brought: She felt that he is not feeling well.  He presented on 04/28/2020 with right hemiparesis and dysarthria. Cranial CT unremarkable for acute intracranial process. Patient did not receive TPA.  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  MRA angiography does not show significant carotid bifurcation disease.  Admission chemistries glucose 172, creatinine 1.44, alcohol negative, urine drug screen negative, hemoglobin A1c 6.0.  Echocardiogram with EF of 60-65%, no wall motion abnormalities grade 1 diastolic dysfunction.  Currently maintained on aspirin and Plavix for CVA prophylaxis x3 weeks then aspirin alone.  Subcutaneous Lovenox for DVT prophylaxis.  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  Started on dysphagia #3 nectar thick liquids.  Therapy evaluations completed with recommendations of physical medicine rehab consult.   Review of Systems  Constitutional: Positive for malaise/fatigue. Negative for chills and fever.  HENT: Negative for hearing loss.   Eyes: Negative for blurred vision and double vision.  Respiratory: Negative for cough and shortness of breath.   Cardiovascular: Negative for chest pain, palpitations and leg swelling.  Gastrointestinal: Positive for constipation. Negative for heartburn, nausea and vomiting.    Genitourinary: Positive for urgency. Negative for dysuria and hematuria.  Musculoskeletal: Positive for myalgias.  Skin: Negative for rash.  Neurological: Positive for speech change, focal weakness and weakness.  All other systems reviewed and are negative.       Past Medical History:  Diagnosis Date  . Hypertension      PSH: No past surgical history No pertinent family history of premature CVA. Social History:  reports that he has never smoked. He has never used smokeless tobacco. He reports current alcohol use. No history on file for drug use. Allergies: No Known Allergies       Medications Prior to Admission  Medication Sig Dispense Refill  . acetaminophen (TYLENOL) 500 MG tablet Take 1,000 mg by mouth every 6 (six) hours as needed for mild pain.       Marland Kitchen amLODipine (NORVASC) 2.5 MG tablet Take 2.5 mg by mouth daily.       Marland Kitchen atorvastatin (LIPITOR) 40 MG tablet Take 40 mg by mouth daily.       Marland Kitchen lisinopril-hydrochlorothiazide (ZESTORETIC) 10-12.5 MG tablet Take 1 tablet by mouth daily.          Home: Home Living Family/patient expects to be discharged to:: Private residence Living Arrangements: Children (with daughter (she brought him from [place] [place] [place])) Available Help at Discharge: Family (daughter) Type of Home: House Home Access: Stairs to enter Secretary/administrator of Steps: 1 (threshold) Home Layout: Two level, 1/2 bath on main level (pt stayed on 1st floor ) Alternate Level Stairs-Number of Steps: flight Bathroom Shower/Tub: Other (comment) (upstairs in dtr's home; pt unsure type)  Functional History: Prior Function Level of Independence: Independent Comments: lived alone in [place]; driving, grocery shopping, yard work; daughter brought him  to  after discharged by [facility] Functional Status:  Mobility: Bed Mobility Overal bed mobility: Needs Assistance Bed Mobility: Rolling, Sidelying to Sit, Sit to Sidelying Rolling: Supervision Sidelying to sit: Mod assist Sit to sidelying:  Min assist General bed mobility comments: coming up from rt side required incr assist to achieve midline (leaning rt); return to rt side min assist to RLE Transfers Overall transfer level: Needs assistance Equipment used: None Transfers: Sit to/from Stand, Lateral/Scoot Transfers Sit to Stand: Mod assist  Lateral/Scoot Transfers: Min assist General transfer comment: rt lean in coming to stand; scooting to his right along EOB x 3 with min assist Ambulation/Gait General Gait Details: unable; will need +2 assist   ADL:   Cognition: Cognition Overall Cognitive Status: Within Functional Limits for tasks assessed Orientation Level: Oriented to person, Oriented to situation, Disoriented to place, Disoriented to time Cognition Arousal/Alertness: Awake/alert Behavior During Therapy: WFL for tasks assessed/performed Overall Cognitive Status: Within Functional Limits for tasks assessed General Comments: o x4   Blood pressure 106/69, pulse 84, temperature 97.8 F (36.6 C), temperature source Oral, resp. rate 18, height  (1.854 m), weight 83.9 kg, SpO2 100 %. Physical Exam Vitals reviewed.  Constitutional:      General: He is not in acute distress.    Appearance: He is normal weight.  HENT:     Head: Normocephalic and atraumatic.     Right Ear: External ear normal.     Left Ear: External ear normal.     Nose: Nose normal.  Eyes:     General:        Right eye: No discharge.        Left eye: No discharge.     Extraocular Movements: Extraocular movements intact.  Cardiovascular:     Rate and Rhythm: Normal rate and regular rhythm.  Pulmonary:     Effort: Pulmonary effort is normal. No respiratory distress.     Breath sounds: No stridor.  Abdominal:     General: Abdomen is flat. Bowel sounds are normal. There is no distension.  Musculoskeletal:     Cervical back: Normal range of motion and neck supple.     Comments: No edema or tenderness in extremities  Skin:    General: Skin  is warm and dry.  Neurological:     Mental Status: He is alert.     Comments: Alert.   No acute distress following commands.   Makes good eye contact with examiner and oriented x3.  Fair awareness of deficits. Moderate dysarthria Motor: RUE 4+/5 proximal distal R LE: 5+/5 proximal to distal LUE/LLE: 5/5 proximal distal  Psychiatric:        Mood and Affect: Mood normal.        Speech: Speech is slurred.     Comments: Distracted        Lab Results Last 24 Hours       Results for orders placed or performed during the hospital encounter of 04/27/20 (from the past 24 hour(s))  TSH     Status: None    Collection Time: 04/28/20  1:17 PM  Result Value Ref Range    TSH 1.742 0.350 - 4.500 uIU/mL  Hemoglobin A1c     Status: Abnormal    Collection Time: 04/28/20  1:17 PM  Result Value Ref Range    Hgb A1c MFr Bld 6.0 (H) 4.8 - 5.6 %    Mean Plasma Glucose 125.5 mg/dL  Comprehensive metabolic panel     Status: Abnormal  Collection Time: 04/28/20  1:17 PM  Result Value Ref Range    Sodium 141 135 - 145 mmol/L    Potassium 3.9 3.5 - 5.1 mmol/L    Chloride 101 98 - 111 mmol/L    CO2 26 22 - 32 mmol/L    Glucose, Bld 111 (H) 70 - 99 mg/dL    BUN 16 8 - 23 mg/dL    Creatinine, Ser 1.61 (H) 0.61 - 1.24 mg/dL    Calcium 9.6 8.9 - 09.6 mg/dL    Total Protein 7.1 6.5 - 8.1 g/dL    Albumin 3.8 3.5 - 5.0 g/dL    AST 36 15 - 41 U/L    ALT 29 0 - 44 U/L    Alkaline Phosphatase 69 38 - 126 U/L    Total Bilirubin 1.1 0.3 - 1.2 mg/dL    GFR, Estimated 53 (L) >60 mL/min    Anion gap 14 5 - 15  CBC with Differential/Platelet     Status: None    Collection Time: 04/28/20  1:17 PM  Result Value Ref Range    WBC 7.3 4.0 - 10.5 K/uL    RBC 4.99 4.22 - 5.81 MIL/uL    Hemoglobin 14.8 13.0 - 17.0 g/dL    HCT 04.5 39 - 52 %    MCV 90.0 80.0 - 100.0 fL    MCH 29.7 26.0 - 34.0 pg    MCHC 33.0 30.0 - 36.0 g/dL    RDW 40.9 81.1 - 91.4 %    Platelets 289 150 - 400 K/uL    nRBC 0.0 0.0 - 0.2 %     Neutrophils Relative % 65 %    Neutro Abs 4.7 1.7 - 7.7 K/uL    Lymphocytes Relative 21 %    Lymphs Abs 1.6 0.7 - 4.0 K/uL    Monocytes Relative 10 %    Monocytes Absolute 0.7 0.1 - 1.0 K/uL    Eosinophils Relative 3 %    Eosinophils Absolute 0.3 0.0 - 0.5 K/uL    Basophils Relative 1 %    Basophils Absolute 0.1 0.0 - 0.1 K/uL    Immature Granulocytes 0 %    Abs Immature Granulocytes 0.02 0.00 - 0.07 K/uL  Magnesium     Status: None    Collection Time: 04/28/20  1:17 PM  Result Value Ref Range    Magnesium 2.1 1.7 - 2.4 mg/dL  Vitamin N82     Status: Abnormal    Collection Time: 04/28/20  1:17 PM  Result Value Ref Range    Vitamin B-12 132 (L) 180 - 914 pg/mL  Lipid panel     Status: Abnormal    Collection Time: 04/28/20  1:17 PM  Result Value Ref Range    Cholesterol 196 0 - 200 mg/dL    Triglycerides 956 <213 mg/dL    HDL 58 >08 mg/dL    Total CHOL/HDL Ratio 3.4 RATIO    VLDL 20 0 - 40 mg/dL    LDL Cholesterol 657 (H) 0 - 99 mg/dL       Imaging Results (Last 48 hours)  CT HEAD WO CONTRAST   Result Date: 04/27/2020 CLINICAL DATA:  Neuro deficit, acute stroke suspected. EXAM: CT HEAD WITHOUT CONTRAST TECHNIQUE: Contiguous axial images were obtained from the base of the skull through the vertex without intravenous contrast. COMPARISON:  None. FINDINGS: Brain: No evidence of acute large vascular territory infarction, hemorrhage, hydrocephalus, extra-axial collection or mass lesion/mass effect. Scattered white matter hypodensities, likely related to  chronic microvascular ischemic disease. Mild generalized cerebral volume loss with ex vacuo ventricular dilation. Vascular: Calcific atherosclerosis. Skull: No acute fracture. Sinuses/Orbits: No acute findings. Other: No mastoid effusions. IMPRESSION: No evidence of acute intracranial abnormality. Electronically Signed   By: Feliberto Harts MD   On: 04/27/2020 14:38    MR ANGIO HEAD WO CONTRAST   Result Date: 04/28/2020 CLINICAL  DATA:  Right-sided weakness.  Dysarthria. EXAM: MRI HEAD WITHOUT AND WITH CONTRAST MRA HEAD WITHOUT CONTRAST MRA NECK WITHOUT AND WITH CONTRAST TECHNIQUE: Multiplanar, multiecho pulse sequences of the brain and surrounding structures were obtained without and with intravenous contrast. Angiographic images of the Circle of Willis were obtained using MRA technique without intravenous contrast. Angiographic images of the neck were obtained using MRA technique without and with intravenous contrast. Carotid stenosis measurements (when applicable) are obtained utilizing NASCET criteria, using the distal internal carotid diameter as the denominator. CONTRAST:  8.84mL GADAVIST GADOBUTROL 1 MMOL/ML IV SOLN COMPARISON:  Head CT yesterday. FINDINGS: MRI HEAD FINDINGS Brain: Diffusion imaging shows acute infarction in the right lateral medulla. No other acute finding. Mild chronic small-vessel ischemic changes affect pons. Mild chronic small-vessel ischemic changes affect the cerebral hemispheric deep white matter. Generalized age related atrophy. No large vessel territory infarction. No mass lesion, hemorrhage, hydrocephalus or extra-axial collection. Vascular: Major vessels at the base of the brain show flow. Skull and upper cervical spine: Negative Sinuses/Orbits: Clear/normal Other: None MRA HEAD FINDINGS Both internal carotid arteries are widely patent through the skull base and siphon regions. The anterior and middle cerebral vessels are normal without proximal stenosis, aneurysm or vascular malformation. Large left vertebral artery patent to the basilar. No antegrade flow seen in a right vertebral artery. No basilar stenosis. Posterior circulation branch vessels are patent. MRA NECK FINDINGS Brachiocephalic vessel origins from the arch are not included. Both common carotid arteries widely patent to the bifurcation. Both carotid bifurcations appear normal. Right cervical ICA is normal. Irregularity of the medial wall of  the left cervical ICA, likely due to fibromuscular disease. 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 Dominant left vertebral artery shows 30-50% stenosis at its origin but wide patency beyond that to the basilar. Right vertebral artery is a severely diseased in thready vessel which does not show flow beyond the upper cervical region. IMPRESSION: 1. 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 2. Mild chronic small-vessel ischemic change of the pons and cerebral hemispheric white matter otherwise. 3. Neck MR angiography does not show any significant carotid bifurcation disease. Irregularity of the medial wall of the left cervical ICA likely due to fibromuscular disease. 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 4. Dominant left vertebral artery shows 30-50% stenosis at its origin but wide patency beyond that to the basilar. Severely diseased right vertebral artery which does not show flow beyond the upper cervical region. Electronically Signed   By: Paulina Fusi M.D.   On: 04/28/2020 13:04    MR ANGIO NECK W WO CONTRAST   Result Date: 04/28/2020 CLINICAL DATA:  Right-sided weakness.  Dysarthria. EXAM: MRI HEAD WITHOUT AND WITH CONTRAST MRA HEAD WITHOUT CONTRAST MRA NECK WITHOUT AND WITH CONTRAST TECHNIQUE: Multiplanar, multiecho pulse sequences of the brain and surrounding structures were obtained without and with intravenous contrast. Angiographic images of the Circle of Willis were obtained using MRA technique without intravenous contrast. Angiographic images of the neck were obtained using MRA technique without and with intravenous contrast. Carotid stenosis measurements (when applicable) are obtained utilizing NASCET criteria, using the distal internal carotid diameter  as the denominator. CONTRAST:  8.33mL GADAVIST GADOBUTROL 1 MMOL/ML IV SOLN COMPARISON:  Head CT yesterday. FINDINGS: MRI HEAD FINDINGS Brain: Diffusion imaging shows acute infarction in the right lateral medulla. No other acute  finding. Mild chronic small-vessel ischemic changes affect pons. Mild chronic small-vessel ischemic changes affect the cerebral hemispheric deep white matter. Generalized age related atrophy. No large vessel territory infarction. No mass lesion, hemorrhage, hydrocephalus or extra-axial collection. Vascular: Major vessels at the base of the brain show flow. Skull and upper cervical spine: Negative Sinuses/Orbits: Clear/normal Other: None MRA HEAD FINDINGS Both internal carotid arteries are widely patent through the skull base and siphon regions. The anterior and middle cerebral vessels are normal without proximal stenosis, aneurysm or vascular malformation. Large left vertebral artery patent to the basilar. No antegrade flow seen in a right vertebral artery. No basilar stenosis. Posterior circulation branch vessels are patent. MRA NECK FINDINGS Brachiocephalic vessel origins from the arch are not included. Both common carotid arteries widely patent to the bifurcation. Both carotid bifurcations appear normal. Right cervical ICA is normal. Irregularity of the medial wall of the left cervical ICA, likely due to fibromuscular disease. 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 Dominant left vertebral artery shows 30-50% stenosis at its origin but wide patency beyond that to the basilar. Right vertebral artery is a severely diseased in thready vessel which does not show flow beyond the upper cervical region. IMPRESSION: 1. 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 2. Mild chronic small-vessel ischemic change of the pons and cerebral hemispheric white matter otherwise. 3. Neck MR angiography does not show any significant carotid bifurcation disease. Irregularity of the medial wall of the left cervical ICA likely due to fibromuscular disease. 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 4. Dominant left vertebral artery shows 30-50% stenosis at its origin but wide patency beyond that to the basilar. Severely diseased  right vertebral artery which does not show flow beyond the upper cervical region. Electronically Signed   By: Paulina Fusi M.D.   On: 04/28/2020 13:04    MR BRAIN WO CONTRAST   Result Date: 04/28/2020 CLINICAL DATA:  Right-sided weakness.  Dysarthria. EXAM: MRI HEAD WITHOUT AND WITH CONTRAST MRA HEAD WITHOUT CONTRAST MRA NECK WITHOUT AND WITH CONTRAST TECHNIQUE: Multiplanar, multiecho pulse sequences of the brain and surrounding structures were obtained without and with intravenous contrast. Angiographic images of the Circle of Willis were obtained using MRA technique without intravenous contrast. Angiographic images of the neck were obtained using MRA technique without and with intravenous contrast. Carotid stenosis measurements (when applicable) are obtained utilizing NASCET criteria, using the distal internal carotid diameter as the denominator. CONTRAST:  8.28mL GADAVIST GADOBUTROL 1 MMOL/ML IV SOLN COMPARISON:  Head CT yesterday. FINDINGS: MRI HEAD FINDINGS Brain: Diffusion imaging shows acute infarction in the right lateral medulla. No other acute finding. Mild chronic small-vessel ischemic changes affect pons. Mild chronic small-vessel ischemic changes affect the cerebral hemispheric deep white matter. Generalized age related atrophy. No large vessel territory infarction. No mass lesion, hemorrhage, hydrocephalus or extra-axial collection. Vascular: Major vessels at the base of the brain show flow. Skull and upper cervical spine: Negative Sinuses/Orbits: Clear/normal Other: None MRA HEAD FINDINGS Both internal carotid arteries are widely patent through the skull base and siphon regions. The anterior and middle cerebral vessels are normal without proximal stenosis, aneurysm or vascular malformation. Large left vertebral artery patent to the basilar. No antegrade flow seen in a right vertebral artery. No basilar stenosis. Posterior circulation branch vessels are patent. MRA NECK  FINDINGS Brachiocephalic  vessel origins from the arch are not included. Both common carotid arteries widely patent to the bifurcation. Both carotid bifurcations appear normal. Right cervical ICA is normal. Irregularity of the medial wall of the left cervical ICA, likely due to fibromuscular disease. 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 Dominant left vertebral artery shows 30-50% stenosis at its origin but wide patency beyond that to the basilar. Right vertebral artery is a severely diseased in thready vessel which does not show flow beyond the upper cervical region. IMPRESSION: 1. 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 2. Mild chronic small-vessel ischemic change of the pons and cerebral hemispheric white matter otherwise. 3. Neck MR angiography does not show any significant carotid bifurcation disease. Irregularity of the medial wall of the left cervical ICA likely due to fibromuscular disease. 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 4. Dominant left vertebral artery shows 30-50% stenosis at its origin but wide patency beyond that to the basilar. Severely diseased right vertebral artery which does not show flow beyond the upper cervical region. Electronically Signed   By: Paulina Fusi M.D.   On: 04/28/2020 13:04    DG Swallowing Func-Speech Pathology   Result Date: 04/29/2020 Completed by Royetta Crochet, SLP Student Supervised and reviewed by Harlon Ditty MA CCC-SLP Objective Swallowing Evaluation: Type of Study: MBS-Modified Barium Swallow Study  Patient Details Name: Tyler Glass MRN: [id number] Date of Birth: 1934/03/14 Today's Date: 04/29/2020 Time: SLP Start Time (ACUTE ONLY): 0930 -SLP Stop Time (ACUTE ONLY): 0951 SLP Time Calculation (min) (ACUTE ONLY): 21 min Past Medical History: Past Medical History: Diagnosis Date . Hypertension  Past Surgical History: No past surgical history on file. HPI: Tynell Winchell is a 86 y.o. male with PMH significant for HTN who presents with right sided weakness, unsteady gait and  difficulty with speech and trouble swallowing and persistent hiccups. 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 Mild chronic small-vessel ischemic change of the pons and cerebral hemispheric white matter otherwise.  Subjective: alert, with spouse at bedside Assessment / Plan / Recommendation CHL IP CLINICAL IMPRESSIONS 04/29/2020 Clinical Impression Pt was seen for MBS and demonstrates a moderate oropharyngeal dysphagia characterized by premature spillage, instances of penetration/aspiration, pharyngeal residue, and impaired UES opening secondary to reduced movement and duration of hyolaryngeal excursion. Premature spillage was observed with thin liquid as the material fell to the level of the pyriforms. Prior to initiating the swallow, a trace amount of thin liquid was penetrated into the laryngeal vestibule above the level of the vocal folds and was subsequently ejected. During the swallow, additional thin liquid fell below the level of the vocal folds and the pt demonstrated an immediate cough in attempt to eject the aspirate. The pt also demonstrated silent aspiration of thin liquid while attempting a chin tuck. No instances of penetration/aspiration occurred with any other POs. The pt demonstrates significant pharyngeal residue across POs in the valleculae, lateral channels, and pyriforms. In order to address pharyngeal residue, multiple swallows (3) were utilized. Additional compensatory strategies were attempted to address residue but were unsuccessful. Recommend dys 3 diet with nectar thick liquids and use of multiple swallows. SLP will continue to f/u acutely for diet toleration and advancement. SLP Visit Diagnosis Dysphagia, oropharyngeal phase (R13.12) Attention and concentration deficit following -- Frontal lobe and executive function deficit following -- Impact on safety and function Moderate aspiration risk   CHL IP TREATMENT RECOMMENDATION 04/29/2020 Treatment  Recommendations Therapy as outlined in treatment plan below   Prognosis 04/29/2020  Prognosis for Safe Diet Advancement Good Barriers to Reach Goals Time post onset Barriers/Prognosis Comment -- CHL IP DIET RECOMMENDATION 04/29/2020 SLP Diet Recommendations Dysphagia 3 (Mech soft) solids;Nectar thick liquid Liquid Administration via Cup Medication Administration Crushed with puree Compensations Multiple dry swallows after each bite/sip Postural Changes Seated upright at 90 degrees   CHL IP OTHER RECOMMENDATIONS 04/29/2020 Recommended Consults -- Oral Care Recommendations Oral care BID Other Recommendations --   No flowsheet data found.  CHL IP FREQUENCY AND DURATION 04/29/2020 Speech Therapy Frequency (ACUTE ONLY) min 2x/week Treatment Duration 2 weeks      CHL IP ORAL PHASE 04/29/2020 Oral Phase Impaired Oral - Pudding Teaspoon -- Oral - Pudding Cup -- Oral - Honey Teaspoon -- Oral - Honey Cup -- Oral - Nectar Teaspoon -- Oral - Nectar Cup -- Oral - Nectar Straw -- Oral - Thin Teaspoon -- Oral - Thin Cup Premature spillage;Delayed oral transit Oral - Thin Straw Premature spillage;Delayed oral transit Oral - Puree WFL Oral - Mech Soft -- Oral - Regular WFL Oral - Multi-Consistency -- Oral - Pill -- Oral Phase - Comment --  CHL IP PHARYNGEAL PHASE 04/29/2020 Pharyngeal Phase Impaired Pharyngeal- Pudding Teaspoon -- Pharyngeal -- Pharyngeal- Pudding Cup -- Pharyngeal -- Pharyngeal- Honey Teaspoon -- Pharyngeal -- Pharyngeal- Honey Cup -- Pharyngeal -- Pharyngeal- Nectar Teaspoon -- Pharyngeal -- Pharyngeal- Nectar Cup -- Pharyngeal -- Pharyngeal- Nectar Straw Pharyngeal residue - valleculae;Pharyngeal residue - pyriform;Reduced anterior laryngeal mobility;Reduced laryngeal elevation;Lateral channel residue;Compensatory strategies attempted (with notebox) Pharyngeal -- Pharyngeal- Thin Teaspoon -- Pharyngeal -- Pharyngeal- Thin Cup Reduced anterior laryngeal mobility;Reduced laryngeal elevation;Pharyngeal residue -  valleculae;Lateral channel residue;Pharyngeal residue - pyriform;Penetration/Aspiration before swallow;Trace aspiration Pharyngeal Material enters airway, remains ABOVE vocal cords then ejected out Pharyngeal- Thin Straw Lateral channel residue;Pharyngeal residue - pyriform;Pharyngeal residue - valleculae;Reduced anterior laryngeal mobility;Reduced laryngeal elevation;Penetration/Aspiration during swallow;Trace aspiration Pharyngeal Material enters airway, remains ABOVE vocal cords then ejected out Pharyngeal- Puree Lateral channel residue;Pharyngeal residue - pyriform;Pharyngeal residue - valleculae;Reduced laryngeal elevation;Reduced anterior laryngeal mobility Pharyngeal -- Pharyngeal- Mechanical Soft -- Pharyngeal -- Pharyngeal- Regular Reduced anterior laryngeal mobility;Reduced laryngeal elevation;Pharyngeal residue - valleculae;Pharyngeal residue - pyriform;Lateral channel residue Pharyngeal -- Pharyngeal- Multi-consistency -- Pharyngeal -- Pharyngeal- Pill -- Pharyngeal -- Pharyngeal Comment --  CHL IP CERVICAL ESOPHAGEAL PHASE 04/29/2020 Cervical Esophageal Phase Impaired Pudding Teaspoon -- Pudding Cup -- Honey Teaspoon -- Honey Cup -- Nectar Teaspoon -- Nectar Cup -- Nectar Straw -- Thin Teaspoon -- Thin Cup -- Thin Straw -- Puree -- Mechanical Soft -- Regular -- Multi-consistency -- Pill -- Cervical Esophageal Comment -- Claudine Mouton 04/29/2020, 10:44 AM               ECHOCARDIOGRAM COMPLETE   Result Date: 04/28/2020    ECHOCARDIOGRAM REPORT   Patient Name:   Tyler Glass Date of Exam: 04/28/2020 Medical Rec #:  [id number]    Height:       73.0 in Accession #:    [id number]   Weight:       185.0 lb Date of Birth:  [birth year]    BSA:          2.082 m Patient Age:    86 years     BP:           125/80 mmHg Patient Gender: M            HR:           80 bpm. Exam Location:  Inpatient Procedure: 2D Echo Indications:  stroke 434.91  History:        Patient has no prior history of Echocardiogram  examinations.                 Risk Factors:Hypertension.  Sonographer:    Celene SkeenVijay Shankar RDCS (AE) Referring Phys: [phone] DAVID GIRGUIS IMPRESSIONS  1. Left ventricular ejection fraction, by estimation, is 60 to 65%. The left ventricle has normal function. The left ventricle has no regional wall motion abnormalities. Left ventricular diastolic parameters are consistent with Grade I diastolic dysfunction (impaired relaxation).  2. Right ventricular systolic function is normal. The right ventricular size is normal.  3. The mitral valve is normal in structure. No evidence of mitral valve regurgitation. No evidence of mitral stenosis.  4. The aortic valve is normal in structure. Aortic valve regurgitation is not visualized. No aortic stenosis is present.  5. The inferior vena cava is normal in size with greater than 50% respiratory variability, suggesting right atrial pressure of 3 mmHg. FINDINGS  Left Ventricle: Left ventricular ejection fraction, by estimation, is 60 to 65%. The left ventricle has normal function. The left ventricle has no regional wall motion abnormalities. The left ventricular internal cavity size was normal in size. There is  no left ventricular hypertrophy. Left ventricular diastolic parameters are consistent with Grade I diastolic dysfunction (impaired relaxation). Normal left ventricular filling pressure. Right Ventricle: The right ventricular size is normal. No increase in right ventricular wall thickness. Right ventricular systolic function is normal. Left Atrium: Left atrial size was normal in size. Right Atrium: Right atrial size was normal in size. Pericardium: There is no evidence of pericardial effusion. Mitral Valve: The mitral valve is normal in structure. No evidence of mitral valve regurgitation. No evidence of mitral valve stenosis. Tricuspid Valve: The tricuspid valve is normal in structure. Tricuspid valve regurgitation is not demonstrated. No evidence of tricuspid stenosis. Aortic  Valve: The aortic valve is normal in structure. Aortic valve regurgitation is not visualized. No aortic stenosis is present. Pulmonic Valve: The pulmonic valve was normal in structure. Pulmonic valve regurgitation is not visualized. No evidence of pulmonic stenosis. Aorta: The aortic root is normal in size and structure. Venous: The inferior vena cava is normal in size with greater than 50% respiratory variability, suggesting right atrial pressure of 3 mmHg. IAS/Shunts: No atrial level shunt detected by color flow Doppler.  LEFT VENTRICLE PLAX 2D LVIDd:         3.60 cm  Diastology LVIDs:         2.30 cm  LV e' medial:    3.05 cm/s LV PW:         1.10 cm  LV E/e' medial:  15.3 LV IVS:        0.90 cm  LV e' lateral:   7.29 cm/s LVOT diam:     2.10 cm  LV E/e' lateral: 6.4 LV SV:         47 LV SV Index:   23 LVOT Area:     3.46 cm  RIGHT VENTRICLE RV S prime:     12.50 cm/s TAPSE (M-mode): 1.6 cm LEFT ATRIUM           Index LA diam:      2.90 cm 1.39 cm/m LA Vol (A2C): 25.6 ml 12.30 ml/m  AORTIC VALVE LVOT Vmax:   64.60 cm/s LVOT Vmean:  51.900 cm/s LVOT VTI:    0.136 m  AORTA Ao Root diam: 3.20 cm MITRAL VALVE MV Area (PHT): 2.73 cm  SHUNTS MV Decel Time: 278 msec    Systemic VTI:  0.14 m MV E velocity: 46.70 cm/s  Systemic Diam: 2.10 cm MV A velocity: 61.30 cm/s MV E/A ratio:  0.76 Mihai Croitoru MD Electronically signed by Thurmon Fair MD Signature Date/Time: 04/28/2020/2:42:34 PM    Final        Assessment/Plan: Diagnosis: Right lateral medullary acute infarct.  Stroke: Continue secondary stroke prophylaxis and Risk Factor Modification listed below:   Antiplatelet therapy:   Blood Pressure Management:  Continue current medication with prn's with permisive HTN per primary team Statin Agent:   Prediabetes management:   Right sided hemiparesis:  PT/OT for mobility, ADL training  Motor recovery: Zoloft Labs independently reviewed.  Records reviewed and summated above.   1. Does the need for  close, 24 hr/day medical supervision in concert with the patient's rehab needs make it unreasonable for this patient to be served in a less intensive setting? Yes  2. Co-Morbidities requiring supervision/potential complications: HTN (monitor and provide prns in accordance with increased physical exertion and pain), CKD (avoid nephrotoxic meds, repeat labs), hyperlipidemia (statin), vitamin B12 deficiency (supplement) 3. Due to safety, disease management, medication administration and patient education, does the patient require 24 hr/day rehab nursing? Yes 4. Does the patient require coordinated care of a physician, rehab nurse, therapy disciplines of PT/OT/SLP to address physical and functional deficits in the context of the above medical diagnosis(es)? Yes Addressing deficits in the following areas: balance, endurance, locomotion, strength, transferring, bathing, dressing, toileting, speech and psychosocial support 5. Can the patient actively participate in an intensive therapy program of at least 3 hrs of therapy per day at least 5 days per week? Yes 6. The potential for patient to make measurable gains while on inpatient rehab is excellent 7. Anticipated functional outcomes upon discharge from inpatient rehab are supervision  with PT, supervision with OT, supervision with SLP. 8. Estimated rehab length of stay to reach the above functional goals is: 14-18 days. 9. Anticipated discharge destination: TBD 10. Overall Rehab/Functional Prognosis: good   RECOMMENDATIONS: This patient's condition is appropriate for continued rehabilitative care in the following setting: CIR if adequate caregiver support available upon discharge. Patient has agreed to participate in recommended program. Yes Note that insurance prior authorization may be required for reimbursement for recommended care.   Comment: Rehab Admissions Coordinator to follow up.   I have personally performed a face to face diagnostic  evaluation, including, but not limited to relevant history and physical exam findings, of this patient and developed relevant assessment and plan.  Additionally, I have reviewed and concur with the physician assistant's documentation above.    Maryla Morrow, MD, ABPMR Mcarthur Rossetti Angiulli, PA-C 04/29/2020

## 2020-05-01 NOTE — Progress Notes (Signed)
Inpatient Rehabilitation Medication Review by a Pharmacist  A complete drug regimen review was completed for this patient to identify any potential clinically significant medication issues.  Clinically significant medication issues were identified:  no   Type of Medication Issue Identified Description of Issue Urgent (address now) Non-Urgent (address on AM team rounds) Plan Plan Accepted by Provider? (Yes / No / Pending AM Rounds)  Drug Interaction(s) (clinically significant)       Duplicate Therapy       Allergy       No Medication Administration End Date       Incorrect Dose       Additional Drug Therapy Needed       Other         Time spent performing this drug regimen review (minutes):  5 min   Lucie Friedlander D. Laney Potash, PharmD, BCPS, BCCCP 05/01/2020, 9:41 PM

## 2020-05-01 NOTE — IPOC Note (Signed)
Individualized overall Plan of Care [facility]) Patient Details Name: Keishawn Rajewski MRN: [id number] DOB: 02-23-1934  Admitting Diagnosis: CVA (cerebral vascular accident) [facility])  Hospital Problems: Principal Problem:   CVA (cerebral vascular accident) (HCC) Active Problems:   Acute blood loss anemia   Hypoalbuminemia due to protein-calorie malnutrition (HCC)   Hypokalemia   Essential hypertension   Transaminitis     Functional Problem List: Nursing Behavior, Edema, Endurance, Medication Management, Nutrition, Pain, Perception, Safety, Skin Integrity  PT Balance, Motor, Safety, Behavior, Nutrition, Sensory, Pain, Skin Integrity, Endurance, Perception, Edema  OT Balance, Cognition, Endurance, Motor, Perception, Safety, Vision, Skin Integrity  SLP Cognition, Nutrition  TR         Basic ADL's: OT Bathing, Dressing, Toileting, Grooming     Advanced  ADL's: OT Simple Meal Preparation, Laundry, Light Housekeeping     Transfers: PT Bed Mobility, Floor, Bed to Chair, Car, Occupational psychologist, Research scientist (life sciences): PT Ambulation, Stairs, Psychologist, prison and probation services     Additional Impairments: OT Fuctional Use of Upper Extremity  SLP Swallowing, Social Cognition   Memory, Awareness  TR      Anticipated Outcomes Item Anticipated Outcome  Self Feeding    Swallowing  mod I   Basic self-care  CGA-supervision  Toileting  CGA   Bathroom Transfers CGA  Bowel/Bladder  supervision/mod I  Transfers  CGA  Locomotion  CGA  Communication  mod I  Cognition  supervision  Pain  <3  Safety/Judgment  supervision/Mod I   Therapy Plan: PT Intensity: Minimum of 1-2 x/day ,45 to 90 minutes PT Frequency: 5 out of 7 days PT Duration Estimated Length of Stay: 2.5-3 weeks OT Intensity: Minimum of 1-2 x/day, 45 to 90 minutes OT Frequency: 5 out of 7 days OT Duration/Estimated Length of Stay: 18-21 days SLP Intensity: Minumum of 1-2 x/day, 30 to 90 minutes SLP Frequency: 3 to 5 out of  7 days SLP Duration/Estimated Length of Stay: 21 days    Team Interventions: Nursing Interventions Patient/Family Education, Disease Management/Prevention, Pain Management, Medication Management, Discharge Planning, Psychosocial Support  PT interventions Ambulation/gait training, Warden/ranger, Cognitive remediation/compensation, Community reintegration, Discharge planning, Disease management/prevention, DME/adaptive equipment instruction, Functional electrical stimulation, Functional mobility training, Neuromuscular re-education, Pain management, Patient/family education, Psychosocial support, Splinting/orthotics, Skin care/wound management, Stair training, Therapeutic Activities, Therapeutic Exercise, UE/LE Strength taining/ROM, UE/LE Coordination activities, Visual/perceptual remediation/compensation, Wheelchair propulsion/positioning  OT Interventions Warden/ranger, Fish farm manager, Patient/family education, Therapeutic Activities, Wheelchair propulsion/positioning, Cognitive remediation/compensation, Psychosocial support, Therapeutic Exercise, Functional mobility training, Self Care/advanced ADL retraining, UE/LE Strength taining/ROM, Discharge planning, Neuromuscular re-education, Skin care/wound managment, UE/LE Coordination activities, Disease mangement/prevention, Visual/perceptual remediation/compensation  SLP Interventions Cognitive remediation/compensation, Cueing hierarchy, Environmental controls, Dysphagia/aspiration precaution training, Internal/external aids, Speech/Language facilitation, Functional tasks, Patient/family education  TR Interventions    SW/CM Interventions Discharge Planning, Psychosocial Support, Patient/Family Education   Barriers to Discharge MD  Medical stability  Nursing Decreased caregiver support, Home environment access/layout, Lack of/limited family support, Medication compliance, Behavior, Inaccessible home  environment    PT Decreased caregiver support, Home environment access/layout, Nutrition means only 1/2 bath on main level  OT      SLP      SW       Team Discharge Planning: Destination: PT-Home ,OT- Home , SLP-Home Projected Follow-up: PT-Home health PT, 24 hour supervision/assistance, OT-  Other (comment) (HH versus OP), SLP-Home Health SLP, 24 hour supervision/assistance Projected Equipment Needs: PT-To be determined, OT- To be determined, SLP-To be determined Equipment Details: PT- , OT-  Patient/family involved in discharge planning: PT- Patient,  OT-Patient, SLP-Patient  MD ELOS: 15-19 days. Medical Rehab Prognosis:  Good Assessment: 86 year old right-handed male with history of hypertension.Marland KitchenHe presented on 04/28/2020 with right hemiparesis and dysarthria.  Head CT unremarkable for acute intracranial process.  Patient did not receive TPA.  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  MRA angiography does not show significant carotid bifurcation disease.  Admission chemistries glucose 172, creatinine 1.44, alcohol negative urine drug screen negative, hemoglobin A1c of 6.  Echocardiogram with ejection fraction of 60-65%, no wall motion abnormalities grade 1 diastolic dysfunction.  Currently maintained on aspirin and Plavix for CVA prophylaxis x3 weeks and aspirin alone.  Hospital course further complicated by postop dysphagia, currently on a dysphagia #3 nectar thick liquid diet. Patient with resulting functional deficits with mobility, transfers, celf-care, swallowing, cognition.  Willl set goals for supervision/mod I with PT/OT/SLP.    Due to the current state of emergency, patients may not be receiving their 3-hours of Medicare-mandated therapy.  See Team Conference Notes for weekly updates to the plan of care

## 2020-05-01 NOTE — Progress Notes (Signed)
Patient arrived on unit, oriented to unit. Reviewed medications, therapy schedule, rehab routine and plan of care. States an understanding of information reviewed. No complications noted at this time. Patient reports no pain and is AX4 Tyler Glass  

## 2020-05-01 NOTE — Discharge Instructions (Signed)
Follow with Primary MD in 7 days   Activity: As tolerated with Full fall precautions use walker/cane & assistance as needed  Disposition Home    Diet:    Diet Order            DIET DYS 3 Room service appropriate? No; Fluid consistency: Nectar Thick  Diet effective now                   Special Instructions: If you have smoked or chewed Tobacco  in the last 2 yrs please stop smoking, stop any regular Alcohol  and or any Recreational drug use.  On your next visit with your primary care physician please Get Medicines reviewed and adjusted.  Please request your Prim.MD to go over all Hospital Tests and Procedure/Radiological results at the follow up, please get all Hospital records sent to your Prim MD by signing hospital release before you go home.  If you experience worsening of your admission symptoms, develop shortness of breath, life threatening emergency, suicidal or homicidal thoughts you must seek medical attention immediately by calling 911 or calling your MD immediately  if symptoms less severe.  You Must read complete instructions/literature along with all the possible adverse reactions/side effects for all the Medicines you take and that have been prescribed to you. Take any new Medicines after you have completely understood and accpet all the possible adverse reactions/side effects.

## 2020-05-01 NOTE — Evaluation (Signed)
Speech Language Pathology Evaluation Patient Details Name: Tyler Glass MRN: [id number] DOB: 1933/11/21 Today's Date: 05/01/2020 Time: 5053-9767 SLP Time Calculation (min) (ACUTE ONLY): 35 min  Problem List:  Patient Active Problem List   Diagnosis Date Noted  . Brainstem stroke (HCC)   . Benign essential HTN   . Other hyperlipidemia   . Vitamin B12 deficiency   . Hypertension   . Chronic renal failure, stage 3a (HCC)   . CVA (cerebral vascular accident) (HCC) 04/27/2020   Past Medical History:  Past Medical History:  Diagnosis Date  . Hypertension    Past Surgical History: History reviewed. No pertinent surgical history. HPI:  Tyler Glass is a 86 y.o. male with PMH significant for HTN who presents with right sided weakness, unsteady gait and difficulty with speech and trouble swallowing and persistent hiccups. 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 Mild chronic small-vessel ischemic change of the pons and cerebral hemispheric white matter otherwise.   Assessment / Plan / Recommendation Clinical Impression  Pt participated in speech/language/cognition evaluation. He denied any significant baseline deficits in speech, language or cognition. Pt stated that he has a ninth-grade education, was living alone prior to admission, and was independent with medication and financial management. Pt's daughter was contacted via phone with the pt's permission. She indicated that the pt had "a little" memory difficulty at baseline due to aging but she denied any other related deficits. Pt and his daughter stated that his speech is now "slurred" and pt indicated that he believes it is approximately 50% back to baseline. [facility] Mental Status Examination was completed to evaluate the pt's cognitive-linguistic skills. He achieved a score of 8/30 which is below the normal limits of 25 or more out of 30. He exhibited difficulty in the areas of  attention, memory, problem solving, and executive function during both formal and informal cognitive-linguistic tasks. He also presented with mild to moderate dysarthria characterized by reduced articulatory precision and reduced vocal intensity which negatively impacted speech intelligibility at the conversational level. Skilled SLP services are clinically indicated at this time to improve motor speech and cognitive-linguistic function.    SLP Assessment  SLP Recommendation/Assessment: Patient needs continued Speech Lanaguage Pathology Services SLP Visit Diagnosis: Dysarthria and anarthria (R47.1);Cognitive communication deficit (R41.841)    Follow Up Recommendations  Inpatient Rehab    Frequency and Duration min 2x/week  2 weeks      SLP Evaluation Cognition  Overall Cognitive Status: Within Functional Limits for tasks assessed Arousal/Alertness: Awake/alert Orientation Level: Oriented to person;Oriented to place;Oriented to situation (Date: May 02, 2020; Day: Magdalene Molly) Attention: Focused;Sustained Focused Attention: Impaired Focused Attention Impairment: Verbal complex Sustained Attention: Impaired Sustained Attention Impairment: Verbal complex Memory: Impaired Memory Impairment: Storage deficit;Retrieval deficit;Decreased recall of new information (Immediate: 4/5; delayed: 0/5; with cues: 2/5) Awareness: Appears intact Problem Solving: Impaired Problem Solving Impairment: Verbal complex (3/4) Executive Function: Reasoning;Sequencing;Organizing Sequencing: Impaired Sequencing Impairment: Verbal complex (Clock drawing: 0/4) Organizing: Impaired Organizing Impairment: Verbal complex (Backward digit span: 2/3)       Comprehension  Auditory Comprehension Overall Auditory Comprehension: Appears within functional limits for tasks assessed Yes/No Questions: Within Functional Limits Commands: Within Functional Limits Conversation: Complex Reading Comprehension Reading Status: Not  tested    Expression Expression Primary Mode of Expression: Verbal Verbal Expression Overall Verbal Expression: Appears within functional limits for tasks assessed Initiation: No impairment Level of Generative/Spontaneous Verbalization: Conversation Repetition: No impairment Naming: No impairment Pragmatics: No impairment   Oral /  Motor  Oral Motor/Sensory Function Overall Oral Motor/Sensory Function: Mild impairment Facial ROM: Reduced right Facial Strength: Reduced right Facial Sensation: Reduced right Velum: Impaired right Motor Speech Overall Motor Speech: Impaired Respiration: Within functional limits Phonation: Low vocal intensity Resonance: Within functional limits Articulation: Impaired Level of Impairment: Conversation Intelligibility: Intelligibility reduced Word: 75-100% accurate Phrase: 75-100% accurate Sentence: 75-100% accurate Conversation: 50-74% accurate Motor Planning: Witnin functional limits Motor Speech Errors: Aware;Consistent   Tyler Glass I. Vear Clock, MS, CCC-SLP Acute Rehabilitation Services Office number [phone] Pager [phone]            Tyler Glass 05/01/2020, 10:12 AM

## 2020-05-01 NOTE — Progress Notes (Signed)
Physical Therapy Treatment Patient Details Name: Tyler Glass MRN: [id number] DOB: 07-01-1933 Today's Date: 05/01/2020    History of Present Illness 86 y.o. male with PMH significant for HTN who presents with right sided weakness, unsteady gait and difficulty with speech and trouble swallowing and persistent hiccups--woke up with these on 04/25/20. He was seen at [facility] and discharged. 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    PT Comments    Pt received in bed, agreeable to participation in therapy. He required mod assist bed mobility, mod assist sit to stand, and mod assist SPT bed to recliner. Performed seated balance activities EOB concentrating on maintaining midline. He continues to present with R lateral lean in sitting and stance. Pt in recliner eating breakfast at end of session.    Follow Up Recommendations  CIR     Equipment Recommendations  Other (comment) (TBA next venue)    Recommendations for Other Services       Precautions / Restrictions Precautions Precautions: Fall    Mobility  Bed Mobility Overal bed mobility: Needs Assistance Bed Mobility: Supine to Sit     Supine to sit: Mod assist;HOB elevated     General bed mobility comments: +rail, increased time, cues for sequencing  Transfers Overall transfer level: Needs assistance Equipment used: 1 person hand held assist Transfers: Sit to/from [employer] Transfers Sit to Stand: Mod assist Stand pivot transfers: Mod assist       General transfer comment: assist to power up and stabilize balance. R lateral lean. Cues for sequencing  Ambulation/Gait                 Stairs             Wheelchair Mobility    Modified Rankin (Stroke Patients Only) Modified Rankin (Stroke Patients Only) Pre-Morbid Rankin Score: No symptoms Modified Rankin: Severe disability     Balance  Overall balance assessment: Needs assistance Sitting-balance support: Feet supported;Bilateral upper extremity supported Sitting balance-Leahy Scale: Poor Sitting balance - Comments: rt lean; with time able to maintain midline with LUE support. Balance activities in sitting EOB. Postural control: Right lateral lean Standing balance support: Bilateral upper extremity supported Standing balance-Leahy Scale: Poor Standing balance comment: static stand with mod assist                            Cognition Arousal/Alertness: Awake/alert Behavior During Therapy: WFL for tasks assessed/performed Overall Cognitive Status: Within Functional Limits for tasks assessed                                        Exercises      General Comments        Pertinent Vitals/Pain Pain Assessment: No/denies pain    Home Living       Type of Home: House              Prior Function            PT Goals (current goals can now be found in the care plan section) Acute Rehab PT Goals Patient Stated Goal: rehab then home Progress towards PT goals: Progressing toward goals    Frequency    Min 4X/week      PT Plan Current plan remains appropriate    Co-evaluation  AM-PAC PT "6 Clicks" Mobility   Outcome Measure  Help needed turning from your back to your side while in a flat bed without using bedrails?: None Help needed moving from lying on your back to sitting on the side of a flat bed without using bedrails?: A Lot Help needed moving to and from a bed to a chair (including a wheelchair)?: A Lot Help needed standing up from a chair using your arms (e.g., wheelchair or bedside chair)?: A Lot Help needed to walk in hospital room?: Total Help needed climbing 3-5 steps with a railing? : Total 6 Click Score: 12    End of Session Equipment Utilized During Treatment: Gait belt Activity Tolerance: Patient tolerated treatment well Patient left:  in chair;with call bell/phone within reach Nurse Communication: Mobility status PT Visit Diagnosis: Ataxic gait (R26.0);Other symptoms and signs involving the nervous system (R29.898);Hemiplegia and hemiparesis Hemiplegia - Right/Left: Right Hemiplegia - dominant/non-dominant: Dominant Hemiplegia - caused by: Cerebral infarction     Time: 7106-2694 PT Time Calculation (min) (ACUTE ONLY): 25 min  Charges:  $Therapeutic Activity: 23-37 mins                     Aida Raider, PT  Office # [phone] Pager [phone]    Ilda Foil 05/01/2020, 9:31 AM

## 2020-05-01 NOTE — Discharge Summary (Signed)
Tyler Glass PIR:[id number] DOB: 12/07/1933 DOA: 04/27/2020  PCP: Patient, No Pcp Per  Admit date: 04/27/2020  Discharge date: 05/01/2020  Admitted From: Home   Disposition:  CIR   Recommendations for Outpatient Follow-up:   Follow up with PCP in 1-2 weeks  PCP Please obtain BMP/CBC, 2 view CXR in 1week,  (see Discharge instructions)   PCP Please follow up on the following pending results: None   Home Health: None   Equipment/Devices: None  Consultations: Neuro Discharge Condition: Stable    CODE STATUS: Full    Diet Recommendation:   Diet Order            DIET DYS 3 Room service appropriate? No; Fluid consistency: Nectar Thick  Diet effective now                  Chief Complaint  Patient presents with  . Cerebrovascular Accident     Brief history of present illness from the day of admission and additional interim summary    Tyler Glass is an 86 yo male with PMH HTN who is transferred from [facility] due to right side weakness/ataxia, change in speech, hiccups, difficulty swallowing, was initially evaluated at the [facility] there after he presented to [facility] where he was diagnosed with an acute CVA and admitted for further care.                                                                 Hospital Course    1. 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  Continue PT OT and speech.  Stroke team following, eventual disposition CIR.    2.  Dyslipidemia.  Lipitor to max.  3. Hypertension.  Permissive hypertension for now.  Can add low-dose Coreg from 05/02/2020 if systolic  is above 150.  4.  AKI versus CKD 3A.  No baseline labs in the system, has been gently hydrated repeat BMP tomorrow morning.   Discharge diagnosis     Principal Problem:   CVA (cerebral vascular accident) (HCC) Active Problems:   Hypertension   Chronic renal failure, stage 3a (HCC)   Brainstem stroke (HCC)   Benign essential HTN   Other hyperlipidemia   Vitamin B12 deficiency    Discharge instructions    Discharge Instructions    Discharge instructions   Complete by: As directed    Follow with Primary MD in 7 days   Activity: As tolerated with Full fall precautions use walker/cane & assistance as needed  Disposition Home    Diet:    Diet Order  DIET DYS 3 Room service appropriate? No; Fluid consistency: Nectar Thick  Diet effective now              Special Instructions: If you have smoked or chewed Tobacco  in the last 2 yrs please stop smoking, stop any regular Alcohol  and or any Recreational drug use.  On your next visit with your primary care physician please Get Medicines reviewed and adjusted.  Please request your Prim.MD to go over all Hospital Tests and Procedure/Radiological results at the follow up, please get all Hospital records sent to your Prim MD by signing hospital release before you go home.  If you experience worsening of your admission symptoms, develop shortness of breath, life threatening emergency, suicidal or homicidal thoughts you must seek medical attention immediately by calling 911 or calling your MD immediately  if symptoms less severe.  You Must read complete instructions/literature along with all the possible adverse reactions/side effects for all the Medicines you take and that have been prescribed to you. Take any new Medicines after you have completely understood and accpet all the possible adverse reactions/side effects.   Increase activity slowly   Complete by: As directed       Discharge Medications   Allergies  as of 05/01/2020   No Known Allergies     Medication List    STOP taking these medications   amLODipine 2.5 MG tablet Commonly known as: NORVASC   lisinopril-hydrochlorothiazide 10-12.5 MG tablet Commonly known as: ZESTORETIC     TAKE these medications   acetaminophen 500 MG tablet Commonly known as: TYLENOL Take 1,000 mg by mouth every 6 (six) hours as needed for mild pain.   aspirin 81 MG EC tablet Take 1 tablet (81 mg total) by mouth daily. Swallow whole. Start taking on: May 02, 2020   atorvastatin 80 MG tablet Commonly known as: LIPITOR Take 1 tablet (80 mg total) by mouth daily. Start taking on: May 02, 2020 What changed:   medication strength  how much to take   chlorproMAZINE 25 MG tablet Commonly known as: THORAZINE Take 1 tablet (25 mg total) by mouth 3 (three) times daily as needed for hiccoughs.   clopidogrel 75 MG tablet Commonly known as: PLAVIX Take 1 tablet (75 mg total) by mouth daily. Start taking on: May 02, 2020   pantoprazole 40 MG tablet Commonly known as: PROTONIX Take 1 tablet (40 mg total) by mouth daily. Start taking on: May 02, 2020   Resource ThickenUp Clear Powd Use as needed to make liquids nectar thick        Follow-up Information    Micki RileySethi, Pramod S, MD. Schedule an appointment as soon as possible for a visit in 2 week(s).   Specialties: Neurology, Radiology Why: CVA Contact information: [address] [place] [address] [phone]               Major procedures and Radiology Reports - PLEASE review detailed and final reports thoroughly  -        CT HEAD WO CONTRAST  Result Date: 04/27/2020 CLINICAL DATA:  Neuro deficit, acute stroke suspected. EXAM: CT HEAD WITHOUT CONTRAST TECHNIQUE: Contiguous axial images were obtained from the base of the skull through the vertex without intravenous contrast. COMPARISON:  None. FINDINGS: Brain: No evidence of acute large vascular  territory infarction, hemorrhage, hydrocephalus, extra-axial collection or mass lesion/mass effect. Scattered white matter hypodensities, likely related to chronic microvascular ischemic disease. Mild generalized cerebral volume loss with ex vacuo  ventricular dilation. Vascular: Calcific atherosclerosis. Skull: No acute fracture. Sinuses/Orbits: No acute findings. Other: No mastoid effusions. IMPRESSION: No evidence of acute intracranial abnormality. Electronically Signed   By: Feliberto Harts MD   On: 04/27/2020 14:38   MR ANGIO HEAD WO CONTRAST  Result Date: 04/28/2020 CLINICAL DATA:  Right-sided weakness.  Dysarthria. EXAM: MRI HEAD WITHOUT AND WITH CONTRAST MRA HEAD WITHOUT CONTRAST MRA NECK WITHOUT AND WITH CONTRAST TECHNIQUE: Multiplanar, multiecho pulse sequences of the brain and surrounding structures were obtained without and with intravenous contrast. Angiographic images of the Circle of Willis were obtained using MRA technique without intravenous contrast. Angiographic images of the neck were obtained using MRA technique without and with intravenous contrast. Carotid stenosis measurements (when applicable) are obtained utilizing NASCET criteria, using the distal internal carotid diameter as the denominator. CONTRAST:  8.77mL GADAVIST GADOBUTROL 1 MMOL/ML IV SOLN COMPARISON:  Head CT yesterday. FINDINGS: MRI HEAD FINDINGS Brain: Diffusion imaging shows acute infarction in the right lateral medulla. No other acute finding. Mild chronic small-vessel ischemic changes affect pons. Mild chronic small-vessel ischemic changes affect the cerebral hemispheric deep white matter. Generalized age related atrophy. No large vessel territory infarction. No mass lesion, hemorrhage, hydrocephalus or extra-axial collection. Vascular: Major vessels at the base of the brain show flow. Skull and upper cervical spine: Negative Sinuses/Orbits: Clear/normal Other: None MRA HEAD FINDINGS Both internal carotid arteries are  widely patent through the skull base and siphon regions. The anterior and middle cerebral vessels are normal without proximal stenosis, aneurysm or vascular malformation. Large left vertebral artery patent to the basilar. No antegrade flow seen in a right vertebral artery. No basilar stenosis. Posterior circulation branch vessels are patent. MRA NECK FINDINGS Brachiocephalic vessel origins from the arch are not included. Both common carotid arteries widely patent to the bifurcation. Both carotid bifurcations appear normal. Right cervical ICA is normal. Irregularity of the medial wall of the left cervical ICA, likely due to fibromuscular disease. 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 Dominant left vertebral artery shows 30-50% stenosis at its origin but wide patency beyond that to the basilar. Right vertebral artery is a severely diseased in thready vessel which does not show flow beyond the upper cervical region. IMPRESSION: 1. 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 2. Mild chronic small-vessel ischemic change of the pons and cerebral hemispheric white matter otherwise. 3. Neck MR angiography does not show any significant carotid bifurcation disease. Irregularity of the medial wall of the left cervical ICA likely due to fibromuscular disease. 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 4. Dominant left vertebral artery shows 30-50% stenosis at its origin but wide patency beyond that to the basilar. Severely diseased right vertebral artery which does not show flow beyond the upper cervical region. Electronically Signed   By: Paulina Fusi M.D.   On: 04/28/2020 13:04   MR ANGIO NECK W WO CONTRAST  Result Date: 04/28/2020 CLINICAL DATA:  Right-sided weakness.  Dysarthria. EXAM: MRI HEAD WITHOUT AND WITH CONTRAST MRA HEAD WITHOUT CONTRAST MRA NECK WITHOUT AND WITH CONTRAST TECHNIQUE: Multiplanar, multiecho pulse sequences of the brain and surrounding structures were obtained without and with  intravenous contrast. Angiographic images of the Circle of Willis were obtained using MRA technique without intravenous contrast. Angiographic images of the neck were obtained using MRA technique without and with intravenous contrast. Carotid stenosis measurements (when applicable) are obtained utilizing NASCET criteria, using the distal internal carotid diameter as the denominator. CONTRAST:  8.32mL GADAVIST GADOBUTROL 1 MMOL/ML IV SOLN COMPARISON:  Head CT  yesterday. FINDINGS: MRI HEAD FINDINGS Brain: Diffusion imaging shows acute infarction in the right lateral medulla. No other acute finding. Mild chronic small-vessel ischemic changes affect pons. Mild chronic small-vessel ischemic changes affect the cerebral hemispheric deep white matter. Generalized age related atrophy. No large vessel territory infarction. No mass lesion, hemorrhage, hydrocephalus or extra-axial collection. Vascular: Major vessels at the base of the brain show flow. Skull and upper cervical spine: Negative Sinuses/Orbits: Clear/normal Other: None MRA HEAD FINDINGS Both internal carotid arteries are widely patent through the skull base and siphon regions. The anterior and middle cerebral vessels are normal without proximal stenosis, aneurysm or vascular malformation. Large left vertebral artery patent to the basilar. No antegrade flow seen in a right vertebral artery. No basilar stenosis. Posterior circulation branch vessels are patent. MRA NECK FINDINGS Brachiocephalic vessel origins from the arch are not included. Both common carotid arteries widely patent to the bifurcation. Both carotid bifurcations appear normal. Right cervical ICA is normal. Irregularity of the medial wall of the left cervical ICA, likely due to fibromuscular disease. 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 Dominant left vertebral artery shows 30-50% stenosis at its origin but wide patency beyond that to the basilar. Right vertebral artery is a severely diseased in thready vessel  which does not show flow beyond the upper cervical region. IMPRESSION: 1. 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 2. Mild chronic small-vessel ischemic change of the pons and cerebral hemispheric white matter otherwise. 3. Neck MR angiography does not show any significant carotid bifurcation disease. Irregularity of the medial wall of the left cervical ICA likely due to fibromuscular disease. 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 4. Dominant left vertebral artery shows 30-50% stenosis at its origin but wide patency beyond that to the basilar. Severely diseased right vertebral artery which does not show flow beyond the upper cervical region. Electronically Signed   By: Paulina Fusi M.D.   On: 04/28/2020 13:04   MR BRAIN WO CONTRAST  Result Date: 04/28/2020 CLINICAL DATA:  Right-sided weakness.  Dysarthria. EXAM: MRI HEAD WITHOUT AND WITH CONTRAST MRA HEAD WITHOUT CONTRAST MRA NECK WITHOUT AND WITH CONTRAST TECHNIQUE: Multiplanar, multiecho pulse sequences of the brain and surrounding structures were obtained without and with intravenous contrast. Angiographic images of the Circle of Willis were obtained using MRA technique without intravenous contrast. Angiographic images of the neck were obtained using MRA technique without and with intravenous contrast. Carotid stenosis measurements (when applicable) are obtained utilizing NASCET criteria, using the distal internal carotid diameter as the denominator. CONTRAST:  8.45mL GADAVIST GADOBUTROL 1 MMOL/ML IV SOLN COMPARISON:  Head CT yesterday. FINDINGS: MRI HEAD FINDINGS Brain: Diffusion imaging shows acute infarction in the right lateral medulla. No other acute finding. Mild chronic small-vessel ischemic changes affect pons. Mild chronic small-vessel ischemic changes affect the cerebral hemispheric deep white matter. Generalized age related atrophy. No large vessel territory infarction. No mass lesion, hemorrhage, hydrocephalus or  extra-axial collection. Vascular: Major vessels at the base of the brain show flow. Skull and upper cervical spine: Negative Sinuses/Orbits: Clear/normal Other: None MRA HEAD FINDINGS Both internal carotid arteries are widely patent through the skull base and siphon regions. The anterior and middle cerebral vessels are normal without proximal stenosis, aneurysm or vascular malformation. Large left vertebral artery patent to the basilar. No antegrade flow seen in a right vertebral artery. No basilar stenosis. Posterior circulation branch vessels are patent. MRA NECK FINDINGS Brachiocephalic vessel origins from the arch are not included. Both common carotid arteries widely patent to the  bifurcation. Both carotid bifurcations appear normal. Right cervical ICA is normal. Irregularity of the medial wall of the left cervical ICA, likely due to fibromuscular disease. 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 Dominant left vertebral artery shows 30-50% stenosis at its origin but wide patency beyond that to the basilar. Right vertebral artery is a severely diseased in thready vessel which does not show flow beyond the upper cervical region. IMPRESSION: 1. 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 2. Mild chronic small-vessel ischemic change of the pons and cerebral hemispheric white matter otherwise. 3. Neck MR angiography does not show any significant carotid bifurcation disease. Irregularity of the medial wall of the left cervical ICA likely due to fibromuscular disease. 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 4. Dominant left vertebral artery shows 30-50% stenosis at its origin but wide patency beyond that to the basilar. Severely diseased right vertebral artery which does not show flow beyond the upper cervical region. Electronically Signed   By: Paulina Fusi M.D.   On: 04/28/2020 13:04   DG Swallowing Func-Speech Pathology  Result Date: 04/29/2020 Completed by Royetta Crochet, SLP Student Supervised and  reviewed by Harlon Ditty MA CCC-SLP Objective Swallowing Evaluation: Type of Study: MBS-Modified Barium Swallow Study  Patient Details Name: Tyler Glass MRN: [id number] Date of Birth: [birth year] Today's Date: 04/29/2020 Time: SLP Start Time (ACUTE ONLY): 0930 -SLP Stop Time (ACUTE ONLY): 0951 SLP Time Calculation (min) (ACUTE ONLY): 21 min Past Medical History: Past Medical History: Diagnosis Date . Hypertension  Past Surgical History: No past surgical history on file. HPI: Tyler Glass is a 86 y.o. male with PMH significant for HTN who presents with right sided weakness, unsteady gait and difficulty with speech and trouble swallowing and persistent hiccups. 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 Mild chronic small-vessel ischemic change of the pons and cerebral hemispheric white matter otherwise.  Subjective: alert, with spouse at bedside Assessment / Plan / Recommendation CHL IP CLINICAL IMPRESSIONS 04/29/2020 Clinical Impression Pt was seen for MBS and demonstrates a moderate oropharyngeal dysphagia characterized by premature spillage, instances of penetration/aspiration, pharyngeal residue, and impaired UES opening secondary to reduced movement and duration of hyolaryngeal excursion. Premature spillage was observed with thin liquid as the material fell to the level of the pyriforms. Prior to initiating the swallow, a trace amount of thin liquid was penetrated into the laryngeal vestibule above the level of the vocal folds and was subsequently ejected. During the swallow, additional thin liquid fell below the level of the vocal folds and the pt demonstrated an immediate cough in attempt to eject the aspirate. The pt also demonstrated silent aspiration of thin liquid while attempting a chin tuck. No instances of penetration/aspiration occurred with any other POs. The pt demonstrates significant pharyngeal residue across POs in the valleculae, lateral channels, and  pyriforms. In order to address pharyngeal residue, multiple swallows (3) were utilized. Additional compensatory strategies were attempted to address residue but were unsuccessful. Recommend dys 3 diet with nectar thick liquids and use of multiple swallows. SLP will continue to f/u acutely for diet toleration and advancement. SLP Visit Diagnosis Dysphagia, oropharyngeal phase (R13.12) Attention and concentration deficit following -- Frontal lobe and executive function deficit following -- Impact on safety and function Moderate aspiration risk   CHL IP TREATMENT RECOMMENDATION 04/29/2020 Treatment Recommendations Therapy as outlined in treatment plan below   Prognosis 04/29/2020 Prognosis for Safe Diet Advancement Good Barriers to Reach Goals Time post onset Barriers/Prognosis Comment -- CHL IP DIET RECOMMENDATION 04/29/2020  SLP Diet Recommendations Dysphagia 3 (Mech soft) solids;Nectar thick liquid Liquid Administration via Cup Medication Administration Crushed with puree Compensations Multiple dry swallows after each bite/sip Postural Changes Seated upright at 90 degrees   CHL IP OTHER RECOMMENDATIONS 04/29/2020 Recommended Consults -- Oral Care Recommendations Oral care BID Other Recommendations --   No flowsheet data found.  CHL IP FREQUENCY AND DURATION 04/29/2020 Speech Therapy Frequency (ACUTE ONLY) min 2x/week Treatment Duration 2 weeks      CHL IP ORAL PHASE 04/29/2020 Oral Phase Impaired Oral - Pudding Teaspoon -- Oral - Pudding Cup -- Oral - Honey Teaspoon -- Oral - Honey Cup -- Oral - Nectar Teaspoon -- Oral - Nectar Cup -- Oral - Nectar Straw -- Oral - Thin Teaspoon -- Oral - Thin Cup Premature spillage;Delayed oral transit Oral - Thin Straw Premature spillage;Delayed oral transit Oral - Puree WFL Oral - Mech Soft -- Oral - Regular WFL Oral - Multi-Consistency -- Oral - Pill -- Oral Phase - Comment --  CHL IP PHARYNGEAL PHASE 04/29/2020 Pharyngeal Phase Impaired Pharyngeal- Pudding Teaspoon -- Pharyngeal  -- Pharyngeal- Pudding Cup -- Pharyngeal -- Pharyngeal- Honey Teaspoon -- Pharyngeal -- Pharyngeal- Honey Cup -- Pharyngeal -- Pharyngeal- Nectar Teaspoon -- Pharyngeal -- Pharyngeal- Nectar Cup -- Pharyngeal -- Pharyngeal- Nectar Straw Pharyngeal residue - valleculae;Pharyngeal residue - pyriform;Reduced anterior laryngeal mobility;Reduced laryngeal elevation;Lateral channel residue;Compensatory strategies attempted (with notebox) Pharyngeal -- Pharyngeal- Thin Teaspoon -- Pharyngeal -- Pharyngeal- Thin Cup Reduced anterior laryngeal mobility;Reduced laryngeal elevation;Pharyngeal residue - valleculae;Lateral channel residue;Pharyngeal residue - pyriform;Penetration/Aspiration before swallow;Trace aspiration Pharyngeal Material enters airway, remains ABOVE vocal cords then ejected out Pharyngeal- Thin Straw Lateral channel residue;Pharyngeal residue - pyriform;Pharyngeal residue - valleculae;Reduced anterior laryngeal mobility;Reduced laryngeal elevation;Penetration/Aspiration during swallow;Trace aspiration Pharyngeal Material enters airway, remains ABOVE vocal cords then ejected out Pharyngeal- Puree Lateral channel residue;Pharyngeal residue - pyriform;Pharyngeal residue - valleculae;Reduced laryngeal elevation;Reduced anterior laryngeal mobility Pharyngeal -- Pharyngeal- Mechanical Soft -- Pharyngeal -- Pharyngeal- Regular Reduced anterior laryngeal mobility;Reduced laryngeal elevation;Pharyngeal residue - valleculae;Pharyngeal residue - pyriform;Lateral channel residue Pharyngeal -- Pharyngeal- Multi-consistency -- Pharyngeal -- Pharyngeal- Pill -- Pharyngeal -- Pharyngeal Comment --  CHL IP CERVICAL ESOPHAGEAL PHASE 04/29/2020 Cervical Esophageal Phase Impaired Pudding Teaspoon -- Pudding Cup -- Honey Teaspoon -- Honey Cup -- Nectar Teaspoon -- Nectar Cup -- Nectar Straw -- Thin Teaspoon -- Thin Cup -- Thin Straw -- Puree -- Mechanical Soft -- Regular -- Multi-consistency -- Pill -- Cervical Esophageal  Comment -- Claudine Mouton 04/29/2020, 10:44 AM              ECHOCARDIOGRAM COMPLETE  Result Date: 04/28/2020    ECHOCARDIOGRAM REPORT   Patient Name:   [facility] Date of Exam: 04/28/2020 Medical Rec #:  [id number]    Height:       73.0 in Accession #:    [id number]   Weight:       185.0 lb Date of Birth:  [birth year]    BSA:          2.082 m Patient Age:    86 years     BP:           125/80 mmHg Patient Gender: M            HR:           80 bpm. Exam Location:  Inpatient Procedure: 2D Echo Indications:    stroke 434.91  History:        Patient has no prior history of Echocardiogram examinations.  Risk Factors:Hypertension.  Sonographer:    Celene Skeen RDCS (AE) Referring Phys: [phone] DAVID GIRGUIS IMPRESSIONS  1. Left ventricular ejection fraction, by estimation, is 60 to 65%. The left ventricle has normal function. The left ventricle has no regional wall motion abnormalities. Left ventricular diastolic parameters are consistent with Grade I diastolic dysfunction (impaired relaxation).  2. Right ventricular systolic function is normal. The right ventricular size is normal.  3. The mitral valve is normal in structure. No evidence of mitral valve regurgitation. No evidence of mitral stenosis.  4. The aortic valve is normal in structure. Aortic valve regurgitation is not visualized. No aortic stenosis is present.  5. The inferior vena cava is normal in size with greater than 50% respiratory variability, suggesting right atrial pressure of 3 mmHg. FINDINGS  Left Ventricle: Left ventricular ejection fraction, by estimation, is 60 to 65%. The left ventricle has normal function. The left ventricle has no regional wall motion abnormalities. The left ventricular internal cavity size was normal in size. There is  no left ventricular hypertrophy. Left ventricular diastolic parameters are consistent with Grade I diastolic dysfunction (impaired relaxation). Normal left ventricular filling pressure.  Right Ventricle: The right ventricular size is normal. No increase in right ventricular wall thickness. Right ventricular systolic function is normal. Left Atrium: Left atrial size was normal in size. Right Atrium: Right atrial size was normal in size. Pericardium: There is no evidence of pericardial effusion. Mitral Valve: The mitral valve is normal in structure. No evidence of mitral valve regurgitation. No evidence of mitral valve stenosis. Tricuspid Valve: The tricuspid valve is normal in structure. Tricuspid valve regurgitation is not demonstrated. No evidence of tricuspid stenosis. Aortic Valve: The aortic valve is normal in structure. Aortic valve regurgitation is not visualized. No aortic stenosis is present. Pulmonic Valve: The pulmonic valve was normal in structure. Pulmonic valve regurgitation is not visualized. No evidence of pulmonic stenosis. Aorta: The aortic root is normal in size and structure. Venous: The inferior vena cava is normal in size with greater than 50% respiratory variability, suggesting right atrial pressure of 3 mmHg. IAS/Shunts: No atrial level shunt detected by color flow Doppler.  LEFT VENTRICLE PLAX 2D LVIDd:         3.60 cm  Diastology LVIDs:         2.30 cm  LV e' medial:    3.05 cm/s LV PW:         1.10 cm  LV E/e' medial:  15.3 LV IVS:        0.90 cm  LV e' lateral:   7.29 cm/s LVOT diam:     2.10 cm  LV E/e' lateral: 6.4 LV SV:         47 LV SV Index:   23 LVOT Area:     3.46 cm  RIGHT VENTRICLE RV S prime:     12.50 cm/s TAPSE (M-mode): 1.6 cm LEFT ATRIUM           Index LA diam:      2.90 cm 1.39 cm/m LA Vol (A2C): 25.6 ml 12.30 ml/m  AORTIC VALVE LVOT Vmax:   64.60 cm/s LVOT Vmean:  51.900 cm/s LVOT VTI:    0.136 m  AORTA Ao Root diam: 3.20 cm MITRAL VALVE MV Area (PHT): 2.73 cm    SHUNTS MV Decel Time: 278 msec    Systemic VTI:  0.14 m MV E velocity: 46.70 cm/s  Systemic Diam: 2.10 cm MV A velocity: 61.30 cm/s MV E/A ratio:  0.76 Thurmon Fair MD Electronically signed  by Thurmon Fair MD Signature Date/Time: 04/28/2020/2:42:34 PM    Final     Micro Results     Recent Results (from the past 240 hour(s))  Respiratory Panel by RT PCR (Flu A&B, Covid) - Nasopharyngeal Swab     Status: None   Collection Time: 04/27/20  2:34 PM   Specimen: Nasopharyngeal Swab  Result Value Ref Range Status   SARS Coronavirus 2 by RT PCR NEGATIVE NEGATIVE Final    Comment: (NOTE) SARS-CoV-2 target nucleic acids are NOT DETECTED.  The SARS-CoV-2 RNA is generally detectable in upper respiratoy specimens during the acute phase of infection. The lowest concentration of SARS-CoV-2 viral copies this assay can detect is 131 copies/mL. A negative result does not preclude SARS-Cov-2 infection and should not be used as the sole basis for treatment or other patient management decisions. A negative result may occur with  improper specimen collection/handling, submission of specimen other than nasopharyngeal swab, presence of viral mutation(s) within the areas targeted by this assay, and inadequate number of viral copies (<131 copies/mL). A negative result must be combined with clinical observations, patient history, and epidemiological information. The expected result is Negative.  Fact Sheet for Patients:  https://www.moore.com/  Fact Sheet for Healthcare Providers:  https://www.young.biz/  This test is no t yet approved or cleared by the [place] FDA and  has been authorized for detection and/or diagnosis of SARS-CoV-2 by FDA under an Emergency Use Authorization (EUA). This EUA will remain  in effect (meaning this test can be used) for the duration of the COVID-19 declaration under Section 564(b)(1) of the Act, 21 U.S.C. section 360bbb-3(b)(1), unless the authorization is terminated or revoked sooner.     Influenza A by PCR NEGATIVE NEGATIVE Final   Influenza B by PCR NEGATIVE NEGATIVE Final    Comment: (NOTE) The Xpert Xpress  SARS-CoV-2/FLU/RSV assay is intended as an aid in  the diagnosis of influenza from Nasopharyngeal swab specimens and  should not be used as a sole basis for treatment. Nasal washings and  aspirates are unacceptable for Xpert Xpress SARS-CoV-2/FLU/RSV  testing.  Fact Sheet for Patients: https://www.moore.com/  Fact Sheet for Healthcare Providers: https://www.young.biz/  This test is not yet approved or cleared by the [place] FDA and  has been authorized for detection and/or diagnosis of SARS-CoV-2 by  FDA under an Emergency Use Authorization (EUA). This EUA will remain  in effect (meaning this test can be used) for the duration of the  Covid-19 declaration under Section 564(b)(1) of the Act, 21  U.S.C. section 360bbb-3(b)(1), unless the authorization is  terminated or revoked. Performed at [facility], [address], [place], [place] [address]     Today   Subjective    Tyler Glass today has no headache,no chest abdominal pain,no new weakness tingling or numbness, feels much better.   Objective   Blood pressure 111/82, pulse 86, temperature 97.6 F (36.4 C), temperature source Oral, resp. rate 18, height 6\' 1"  (1.854 m), weight 83.9 kg, SpO2 97 %.   Intake/Output Summary (Last 24 hours) at 05/01/2020 1045 Last data filed at 05/01/2020 0818 Gross per 24 hour  Intake 240 ml  Output 650 ml  Net -410 ml    Exam  Awake Alert, minimal right-sided facial droop and mild subjective right-sided weakness Titus.AT,PERRAL Supple Neck,No JVD, No cervical lymphadenopathy appriciated.  Symmetrical Chest wall movement, Good air movement bilaterally, CTAB RRR,No Gallops,Rubs or new Murmurs, No Parasternal Heave +ve  B.Sounds, Abd Soft, Non tender, No organomegaly appriciated, No rebound -guarding or rigidity. No Cyanosis, Clubbing or edema, No new Rash or bruise   Data Review   CBC w Diff:  Lab Results  Component Value Date     WBC 7.3 04/28/2020   HGB 14.8 04/28/2020   HCT 44.9 04/28/2020   PLT 289 04/28/2020   LYMPHOPCT 21 04/28/2020   MONOPCT 10 04/28/2020   EOSPCT 3 04/28/2020   BASOPCT 1 04/28/2020    CMP:  Lab Results  Component Value Date   NA 141 04/28/2020   K 3.9 04/28/2020   CL 101 04/28/2020   CO2 26 04/28/2020   BUN 16 04/28/2020   CREATININE 1.32 (H) 04/28/2020   PROT 7.1 04/28/2020   ALBUMIN 3.8 04/28/2020   BILITOT 1.1 04/28/2020   ALKPHOS 69 04/28/2020   AST 36 04/28/2020   ALT 29 04/28/2020  .  Lab Results  Component Value Date   HGBA1C 6.0 (H) 04/28/2020   Lipid Panel     Component Value Date/Time   CHOL 196 04/28/2020 1317   TRIG 101 04/28/2020 1317   HDL 58 04/28/2020 1317   CHOLHDL 3.4 04/28/2020 1317   VLDL 20 04/28/2020 1317   LDLCALC 118 (H) 04/28/2020 1317    Total Time in preparing paper work, data evaluation and todays exam - 35 minutes  Susa Raring M.D on 05/01/2020 at 10:45 AM  [facility]   Office  [phone]

## 2020-05-01 NOTE — Progress Notes (Deleted)
PMR Admission Coordinator Pre-Admission Assessment   Patient: Tyler Glass is an 86 y.o., male MRN: [id number] DOB: 10-Apr-1934 Height: _0  (185.4 cm) Weight: 83.9 kg                                                                                                                                                  Insurance Information HMO: yes    PPO:      PCP:      IPA:      80/20:      OTHER:  PRIMARY: [employer]      Policy#: [id number]      Subscriber: pt CM Name: Angelica      Phone#: [phone]   Fax#: [phone] Pre-Cert#: [id number] auth for CIR provided by Angelica with [employer] with updates due to fax listed above on 11/18.      Employer:  Benefits:  Phone #: [phone]     Name:  Eff. Date: 06/21/19     Deduct: $0      Out of Pocket Max: $7550 ($0 met)      Life Max: n/a  CIR: $298/day for days 1-7      SNF: 100% Outpatient:      Co-Pay: $25-$40/visit Home Health: 100%      Co-Pay:  DME: 80%     Co-Ins: 20% Providers:  SECONDARY:       Policy#:       Phone#:    Development worker, community:       Phone#:    The Therapist, art Information Summary" for patients in Inpatient Rehabilitation Facilities with attached "Privacy Act [place] Records" was provided and verbally reviewed with: Patient and Family   Emergency Contact Information Contact Information       Name Relation Home Work Lynn Daughter     [phone]         Current Medical History  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 right-handed male with history of hypertension.  He presented to [facility] on 04/27/2020 with right hemiparesis and dysarthria. Cranial CT unremarkable for acute intracranial process. Patient did not receive TPA.  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 Transfer to [facility] for [facility] workup.  MRA  angiography does not show significant carotid bifurcation disease.  Admission chemistries glucose 172, creatinine 1.44, alcohol negative, urine drug screen negative, hemoglobin A1c 6.0.  Echocardiogram with EF of 60-65%, no wall motion abnormalities grade 1 diastolic dysfunction.  Currently maintained on aspirin and Plavix for CVA prophylaxis x3 weeks then aspirin alone.  Subcutaneous Lovenox for DVT prophylaxis.  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  Started on dysphagia #3 nectar thick liquids.  Therapy evaluations completed with recommendations of  CIR.   Complete NIHSS TOTAL: 2   Past Medical History      Past Medical History:  Diagnosis Date  . Hypertension        Family History  family history is not on file.   Prior Rehab/Hospitalizations:  Has the patient had prior rehab or hospitalizations prior to admission? No; presented to [facility] hospital in [facility] prior to [place], but does not appear to have been admitted.    Has the patient had major surgery during 100 days prior to admission? No   Current Medications    Current Facility-Administered Medications:  .  acetaminophen (TYLENOL) tablet 650 mg, 650 mg, Oral, Q4H PRN **OR** [DISCONTINUED] acetaminophen (TYLENOL) 160 MG/5ML solution 650 mg, 650 mg, Per Tube, Q4H PRN **OR** [DISCONTINUED] acetaminophen (TYLENOL) suppository 650 mg, 650 mg, Rectal, Q4H PRN, Dwyane Dee, MD .  aspirin EC tablet 81 mg, 81 mg, Oral, Daily, [name], [name], NP, 81 mg at 05/01/20 1034 .  atorvastatin (LIPITOR) tablet 80 mg, 80 mg, Oral, Daily, Thurnell Lose, MD, 80 mg at 05/01/20 1034 .  chlorproMAZINE (THORAZINE) tablet 12.5 mg, 12.5 mg, Oral, TID, [name], [name], NP, 12.5 mg at 05/01/20 1033 .  clopidogrel (PLAVIX) tablet 75 mg, 75 mg, Oral, Daily, Donnetta Simpers, MD, 75 mg at 05/01/20 1034 .  enoxaparin (LOVENOX) injection 40 mg, 40 mg, Subcutaneous, Daily, Dwyane Dee, MD, 40 mg at 05/01/20 1034 .  hydrALAZINE (APRESOLINE)  injection 10 mg, 10 mg, Intravenous, Q4H PRN, Dwyane Dee, MD .  influenza vaccine adjuvanted (FLUAD) injection 0.5 mL, 0.5 mL, Intramuscular, Tomorrow-1000, Singh, Prashant K, MD .  labetalol (NORMODYNE) injection 10 mg, 10 mg, Intravenous, Q4H PRN, Dwyane Dee, MD .  MEDLINE mouth rinse, 15 mL, Mouth Rinse, BID, Thurnell Lose, MD, 15 mL at 05/01/20 1114 .  pantoprazole (PROTONIX) EC tablet 40 mg, 40 mg, Oral, Daily, Thurnell Lose, MD, 40 mg at 05/01/20 1034 .  Resource ThickenUp Clear, , Oral, PRN, Thurnell Lose, MD   Patients Current Diet:  Diet Order                  DIET DYS 3 Room service appropriate? No; Fluid consistency: Nectar Thick  Diet effective now                         Precautions / Restrictions Precautions Precautions: Fall Precaution Comments: pt denies falling prior to or since CVA Restrictions Weight Bearing Restrictions: No    Has the patient had 2 or more falls or a fall with injury in the past year?No   Prior Activity Level Community (5-7x/wk): fully independent, driving, working in the yard, no DME used prior to admission (lived independently in [facility])   Prior Functional Level Prior Function Level of Independence: Independent Comments: lived alone in [place]; driving, grocery shopping, yard work; daughter brought him to [facility] after discharged by [facility]: Did the patient need help bathing, dressing, using the toilet or eating?  Independent   Indoor Mobility: Did the patient need assistance with walking from room to room (with or without device)? Independent   Stairs: Did the patient need assistance with internal or external stairs (with or without device)? Independent   Functional Cognition: Did the patient need help planning regular tasks such as shopping or remembering to take medications? Independent   Home Assistive Devices / Equipment Home Assistive Devices/Equipment: None   Prior Device Use: Indicate devices/aids used by the  patient prior to current illness, exacerbation or injury? None of the above   Current Functional Level Cognition   Arousal/Alertness: Awake/alert Overall Cognitive Status: Within Functional Limits for tasks assessed Orientation Level: Oriented to person, Oriented to place, Oriented to situation (Date: May 02, 2020; Day: Thurs) General Comments: o x4 Attention: Focused, Sustained Focused Attention: Impaired Focused Attention Impairment: Verbal complex Sustained Attention: Impaired Sustained Attention Impairment: Verbal complex Memory: Impaired Memory Impairment: Storage deficit, Retrieval deficit, Decreased recall of new information (Immediate: 4/5; delayed: 0/5; with cues: 2/5) Awareness: Appears intact Problem Solving: Impaired Problem Solving Impairment: Verbal complex (3/4) Executive Function: Reasoning, Sequencing, Organizing Sequencing: Impaired Sequencing Impairment: Verbal complex (Clock drawing: 0/4) Organizing: Impaired Organizing Impairment: Verbal complex (Backward digit span: 2/3)    Extremity Assessment (includes Sensation/Coordination)   Upper Extremity Assessment: RUE deficits/detail RUE Deficits / Details: grossly 3/5 strength, movements very ataxic from shoulder RUE Sensation: decreased light touch, decreased proprioception RUE Coordination: decreased fine motor, decreased gross motor  Lower Extremity Assessment: RLE deficits/detail RLE Deficits / Details: limited assessment with lesser ataxia than RUE noted; strength grossly 4/5 RLE Sensation:  (stated RLE some numbness (?accuracy)) RLE Coordination: decreased gross motor     ADLs   Overall ADL's : Needs assistance/impaired Eating/Feeding: Sitting, Moderate assistance Eating/Feeding Details (indicate cue type and reason): will attempt modified utensils - balancing weight with increased handles Grooming: Moderate assistance, Sitting Grooming Details (indicate cue type and reason): RUE (dominant) ataxic and  overall weak impacting ability to interact and use functional ADL tools - during oral care wash cloth used to build up handle of toothbrush and assist to guide to mouth Upper Body Bathing: Moderate assistance Lower Body Bathing: Moderate assistance Upper Body Dressing : Moderate assistance Lower Body Dressing: Moderate assistance, Maximal assistance, Sit to/from stand Toilet Transfer: Maximal assistance, Buyer, retail Details (indicate cue type and reason): face to face transfer using gait belt, asisst for body position in space and boost to turn Toileting- Clothing Manipulation and Hygiene: Maximal assistance Functional mobility during ADLs: Moderate assistance, Maximal assistance (SPT only this session, for true ambulation will require +2) General ADL Comments: Pt very motivated and eager for therapy     Mobility   Overal bed mobility: Needs Assistance Bed Mobility: Supine to Sit Rolling: Supervision Sidelying to sit: Mod assist, HOB elevated Supine to sit: Mod assist, HOB elevated Sit to sidelying: Min assist General bed mobility comments: +rail, increased time, cues for sequencing     Transfers   Overall transfer level: Needs assistance Equipment used: 1 person hand held assist Transfers: Sit to/from Stand, Stand Pivot Transfers Sit to Stand: Mod assist Stand pivot transfers: Mod assist  Lateral/Scoot Transfers: Min assist General transfer comment: assist to power up and stabilize balance. R lateral lean. Cues for sequencing     Ambulation / Gait / Stairs / Wheelchair Mobility   Ambulation/Gait General Gait Details: pre-gait activities in stance. Lateral weight shifts. Finding midline.     Posture / Balance Dynamic Sitting Balance Sitting balance - Comments: rt lean; with time able to maintain midline with LUE support. Balance activities in sitting EOB. Balance Overall balance assessment: Needs assistance Sitting-balance support: Feet supported, Bilateral upper  extremity supported Sitting balance-Leahy Scale: Poor Sitting balance - Comments: rt lean; with time able to maintain midline with LUE support. Balance activities in sitting EOB. Postural control: Right lateral lean Standing balance support: Bilateral upper extremity supported Standing balance-Leahy Scale: Poor Standing balance comment: static stand with mod assist  Special needs/care consideration Designated visitor daughter, Andi Devon        Previous Home Environment (from acute therapy documentation) Living Arrangements: Children (with daughter (she brought him from [facility])) Available Help at Discharge: Family Type of Home: House Home Layout: Two level, 1/2 bath on main level (pt stayed on 1st floor ) Alternate Level Stairs-Number of Steps: flight Home Access: Stairs to enter Entrance Stairs-Number of Steps: 1 (threshold) Bathroom Shower/Tub: Other (comment) (upstairs in dtr's home; pt unsure type) [place]: No   Discharge Living Setting Plans for Discharge Living Setting: Lives with (comment) (daughter Andi Devon (temporarily since CVA)) Type of Home at Discharge: House Discharge Home Layout: Two level, 1/2 bath on main level (pt has been staying on first floor) Alternate Level Stairs-Number of Steps: full flight Discharge Home Access: Stairs to enter Entrance Stairs-Rails: None Entrance Stairs-Number of Steps: 1 Discharge Bathroom Shower/Tub: Tub/shower unit Discharge Bathroom Toilet: Standard Discharge Bathroom Accessibility: Yes How Accessible: Accessible via walker Does the patient have any problems obtaining your medications?: No   Social/Family/Support Systems Patient Roles: Parent Anticipated Caregiver: Press photographer Information: [phone] Ability/Limitations of Caregiver: 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  Felt he was having stroke like symptoms, so went to the [place] [facility] in [facility] where his daughter met him.  He was discharged from the [facility] hospital with symptoms seemingly worsening.  Returned to [facility] with Aliya and went to [facility] and was transferred to [facility].  Pt/Family Agrees to Admission and willing to participate: Yes Program Orientation Provided & Reviewed with Pt/Caregiver Including Roles  & Responsibilities: Yes  Barriers to Discharge: Insurance for SNF coverage     Decrease burden of Care through IP rehab admission: n/a   Possible need for SNF placement upon discharge: Not anticipated.  Good family support.    Patient Condition: This patient's condition remains as documented in the consult dated 04/29/2020, in which the Rehabilitation Physician determined and documented that the patient's condition is appropriate for intensive rehabilitative care in an inpatient rehabilitation facility. Will admit to inpatient rehab today.   Preadmission Screen Completed By:  Michel Santee, PT, DPT 05/01/2020 11:16 AM ______________________________________________________________________   Discussed status with Dr. Posey Pronto on 05/01/20 at 11:16 AM  and received approval for admission today.   Admission Coordinator:  Michel Santee, PT, DPT time 11:16 AM Sudie Grumbling 05/01/20

## 2020-05-01 NOTE — Progress Notes (Signed)
Patient transferred from [phone] to [id number] via wheelchair; alert and oriented x 3-4; no complaints of pain; IV d/c; skin intact.  Report given to RN.

## 2020-05-01 NOTE — Progress Notes (Signed)
PROGRESS NOTE                                                                                                                                                                                                             Patient Demographics:    Tyler Glass, is a 86 y.o. male, DOB - December 29, 1933, ZOX:[id number]  Outpatient Primary MD for the patient is Patient, No Pcp Per    LOS - 3  Admit date - 04/27/2020    Chief Complaint  Patient presents with  . Cerebrovascular Accident       Brief Narrative (HPI from H&P) - Tyler Glass is an 86 yo male with PMH HTN who is transferred from [facility] due to right side weakness/ataxia, change in speech, hiccups, difficulty swallowing, was initially evaluated at the [facility] there after he presented to [facility] where he was diagnosed with an acute CVA and admitted for further care.   Subjective:   Patient in bed, appears comfortable, denies any headache, no fever, no chest pain or pressure, no shortness of breath , no abdominal pain. No focal weakness.    Assessment  & Plan :     1. 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  Continue PT OT and speech.  Stroke team following, eventual disposition CIR, await bed.     Encouraged the patient to sit up in chair in the daytime use I-S and flutter valve for pulmonary toiletry and then prone in bed when at night.  Will advance activity and titrate down oxygen as possible.    SpO2: 97 % Lab Results  Component Value Date   HGBA1C 6.0 (H) 04/28/2020    Lipid Panel     Component Value Date/Time   CHOL 196 04/28/2020 1317   TRIG 101 04/28/2020 1317   HDL 58 04/28/2020 1317   CHOLHDL 3.4 04/28/2020 1317   VLDL 20  04/28/2020 1317   LDLCALC 118 (H) 04/28/2020 1317    2.  Dyslipidemia.  Lipitor to max.  3. Hypertension.  Permissive hypertension for now.  4.  AKI versus CKD 3A.  No baseline labs in  the system, has been gently hydrated repeat BMP tomorrow morning.    Condition - Extremely Guarded  Family Communication  :  daughter Tyler Glass [phone] on 04/29/20  Code Status :  Full  Consults  :  Neuro  Procedures  :   MRI-A - 1. 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 2. Mild chronic small-vessel ischemic change of the pons and cerebral hemispheric white matter otherwise. 3. Neck MR angiography does not show any significant carotid bifurcation disease. Irregularity of the medial wall of the left cervical ICA likely due to fibromuscular disease. 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 4. Dominant left vertebral artery shows 30-50% stenosis at its origin but wide patency beyond that to the basilar. Severely diseased right vertebral artery which does not show flow beyond the upper cervical region  TTE - 1. Left ventricular ejection fraction, by estimation, is 60 to 65%. The left ventricle has normal function. The left ventricle has no regional wall motion abnormalities. Left ventricular diastolic parameters are consistent with Grade I diastolic dysfunction (impaired relaxation).  2. Right ventricular systolic function is normal. The right ventricular size is normal.  3. The mitral valve is normal in structure. No evidence of mitral valve regurgitation. No evidence of mitral stenosis.  4. The aortic valve is normal in structure. Aortic valve regurgitation is not visualized. No aortic stenosis is present.  5. The inferior vena cava is normal in size with greater than 50% respiratory variability, suggesting right atrial pressure of 3 mmHg.  PUD Prophylaxis : PPI  Disposition Plan  :    Status is: Inpatient  Remains inpatient appropriate because:IV treatments appropriate due to  intensity of illness or inability to take PO   Dispo: The patient is from: Home              Anticipated d/c is to: Home              Anticipated d/c date is: 3 days              Patient currently is not medically stable to d/c.   DVT Prophylaxis  :  Lovenox    Lab Results  Component Value Date   PLT 289 04/28/2020    Diet :  Diet Order            DIET DYS 3 Room service appropriate? No; Fluid consistency: Nectar Thick  Diet effective now                  Inpatient Medications  Scheduled Meds: . aspirin EC  81 mg Oral Daily  . atorvastatin  80 mg Oral Daily  . chlorproMAZINE  12.5 mg Oral TID  . clopidogrel  75 mg Oral Daily  . enoxaparin (LOVENOX) injection  40 mg Subcutaneous Daily  . influenza vaccine adjuvanted  0.5 mL Intramuscular Tomorrow-1000  . mouth rinse  15 mL Mouth Rinse BID  . pantoprazole  40 mg Oral Daily   Continuous Infusions: PRN Meds:.acetaminophen **OR** acetaminophen (TYLENOL) oral liquid 160 mg/5 mL **OR** acetaminophen, hydrALAZINE, labetalol, Resource ThickenUp Clear  Antibiotics  :    Anti-infectives (From admission, onward)   None       Time Spent in minutes  30   Susa Raring M.D on 05/01/2020 at 9:07 AM  To page go to [email or web address] - password [facility]  [facility] -  Office  [phone]   See all Orders from today for further details    Objective:   Vitals:   04/30/20  1953 05/01/20 0000 05/01/20 0400 05/01/20 0800  BP: 129/71 114/66 131/67 111/82  Pulse: 80 88 84 86  Resp: 15 11 12 18   Temp: 98.1 F (36.7 C) 97.6 F (36.4 C) 97.6 F (36.4 C) 97.6 F (36.4 C)  TempSrc: Oral Oral Oral Oral  SpO2: 99% 97% 97% 97%  Weight:      Height:        Wt Readings from Last 3 Encounters:  04/27/20 83.9 kg     Intake/Output Summary (Last 24 hours) at 05/01/2020 0907 Last data filed at 05/01/2020 0818 Gross per 24 hour  Intake 240 ml  Output 650 ml  Net -410 ml     Physical Exam  Awake Alert, No new F.N  deficits, resolving right facial droop, minimal subjective right-sided weakness [place].AT,PERRAL Supple Neck,No JVD, No cervical lymphadenopathy appriciated.  Symmetrical Chest wall movement, Good air movement bilaterally, CTAB RRR,No Gallops, Rubs or new Murmurs, No Parasternal Heave +ve B.Sounds, Abd Soft, No tenderness, No organomegaly appriciated, No rebound - guarding or rigidity. No Cyanosis, Clubbing or edema, No new Rash or bruise    Data Review:    CBC Recent Labs  Lab 04/27/20 1424 04/28/20 1317  WBC 10.0 7.3  HGB 14.2 14.8  HCT 43.6 44.9  PLT 312 289  MCV 88.1 90.0  MCH 28.7 29.7  MCHC 32.6 33.0  RDW 14.0 13.8  LYMPHSABS 2.1 1.6  MONOABS 0.7 0.7  EOSABS 0.1 0.3  BASOSABS 0.0 0.1    Recent Labs  Lab 04/27/20 1424 04/28/20 1317  NA 139 141  K 3.6 3.9  CL 99 101  CO2 28 26  GLUCOSE 172* 111*  BUN 15 16  CREATININE 1.44* 1.32*  CALCIUM 9.7 9.6  AST 34 36  ALT 22 29  ALKPHOS 62 69  BILITOT 0.7 1.1  ALBUMIN 4.0 3.8  MG  --  2.1  INR 1.0  --   TSH  --  1.742  HGBA1C  --  6.0*    ------------------------------------------------------------------------------------------------------------------ Recent Labs    04/28/20 1317  CHOL 196  HDL 58  LDLCALC 118*  TRIG 101  CHOLHDL 3.4    Lab Results  Component Value Date   HGBA1C 6.0 (H) 04/28/2020   ------------------------------------------------------------------------------------------------------------------ Recent Labs    04/28/20 1317  TSH 1.742    Cardiac Enzymes No results for input(s): CKMB, TROPONINI, MYOGLOBIN in the last 168 hours.  Invalid input(s): CK ------------------------------------------------------------------------------------------------------------------ No results found for: BNP  Micro Results Recent Results (from the past 240 hour(s))  Respiratory Panel by RT PCR (Flu A&B, Covid) - Nasopharyngeal Swab     Status: None   Collection Time: 04/27/20  2:34 PM    Specimen: Nasopharyngeal Swab  Result Value Ref Range Status   SARS Coronavirus 2 by RT PCR NEGATIVE NEGATIVE Final    Comment: (NOTE) SARS-CoV-2 target nucleic acids are NOT DETECTED.  The SARS-CoV-2 RNA is generally detectable in upper respiratoy specimens during the acute phase of infection. The lowest concentration of SARS-CoV-2 viral copies this assay can detect is 131 copies/mL. A negative result does not preclude SARS-Cov-2 infection and should not be used as the sole basis for treatment or other patient management decisions. A negative result may occur with  improper specimen collection/handling, submission of specimen other than nasopharyngeal swab, presence of viral mutation(s) within the areas targeted by this assay, and inadequate number of viral copies (<131 copies/mL). A negative result must be combined with clinical observations, patient history, and epidemiological information. The expected result  is Negative.  Fact Sheet for Patients:  https://www.moore.com/  Fact Sheet for Healthcare Providers:  https://www.young.biz/  This test is no t yet approved or cleared by the [place] FDA and  has been authorized for detection and/or diagnosis of SARS-CoV-2 by FDA under an Emergency Use Authorization (EUA). This EUA will remain  in effect (meaning this test can be used) for the duration of the COVID-19 declaration under Section 564(b)(1) of the Act, 21 U.S.C. section 360bbb-3(b)(1), unless the authorization is terminated or revoked sooner.     Influenza A by PCR NEGATIVE NEGATIVE Final   Influenza B by PCR NEGATIVE NEGATIVE Final    Comment: (NOTE) The Xpert Xpress SARS-CoV-2/FLU/RSV assay is intended as an aid in  the diagnosis of influenza from Nasopharyngeal swab specimens and  should not be used as a sole basis for treatment. Nasal washings and  aspirates are unacceptable for Xpert Xpress SARS-CoV-2/FLU/RSV   testing.  Fact Sheet for Patients: https://www.moore.com/  Fact Sheet for Healthcare Providers: https://www.young.biz/  This test is not yet approved or cleared by the [place] FDA and  has been authorized for detection and/or diagnosis of SARS-CoV-2 by  FDA under an Emergency Use Authorization (EUA). This EUA will remain  in effect (meaning this test can be used) for the duration of the  Covid-19 declaration under Section 564(b)(1) of the Act, 21  U.S.C. section 360bbb-3(b)(1), unless the authorization is  terminated or revoked. Performed at [facility], [address] [place], [place] [address]     Radiology Reports CT HEAD WO CONTRAST  Result Date: 04/27/2020 CLINICAL DATA:  Neuro deficit, acute stroke suspected. EXAM: CT HEAD WITHOUT CONTRAST TECHNIQUE: Contiguous axial images were obtained from the base of the skull through the vertex without intravenous contrast. COMPARISON:  None. FINDINGS: Brain: No evidence of acute large vascular territory infarction, hemorrhage, hydrocephalus, extra-axial collection or mass lesion/mass effect. Scattered white matter hypodensities, likely related to chronic microvascular ischemic disease. Mild generalized cerebral volume loss with ex vacuo ventricular dilation. Vascular: Calcific atherosclerosis. Skull: No acute fracture. Sinuses/Orbits: No acute findings. Other: No mastoid effusions. IMPRESSION: No evidence of acute intracranial abnormality. Electronically Signed   By: Feliberto Harts MD   On: 04/27/2020 14:38   MR ANGIO HEAD WO CONTRAST  Result Date: 04/28/2020 CLINICAL DATA:  Right-sided weakness.  Dysarthria. EXAM: MRI HEAD WITHOUT AND WITH CONTRAST MRA HEAD WITHOUT CONTRAST MRA NECK WITHOUT AND WITH CONTRAST TECHNIQUE: Multiplanar, multiecho pulse sequences of the brain and surrounding structures were obtained without and with intravenous contrast. Angiographic images of the Circle  of Willis were obtained using MRA technique without intravenous contrast. Angiographic images of the neck were obtained using MRA technique without and with intravenous contrast. Carotid stenosis measurements (when applicable) are obtained utilizing NASCET criteria, using the distal internal carotid diameter as the denominator. CONTRAST:  8.104mL GADAVIST GADOBUTROL 1 MMOL/ML IV SOLN COMPARISON:  Head CT yesterday. FINDINGS: MRI HEAD FINDINGS Brain: Diffusion imaging shows acute infarction in the right lateral medulla. No other acute finding. Mild chronic small-vessel ischemic changes affect pons. Mild chronic small-vessel ischemic changes affect the cerebral hemispheric deep white matter. Generalized age related atrophy. No large vessel territory infarction. No mass lesion, hemorrhage, hydrocephalus or extra-axial collection. Vascular: Major vessels at the base of the brain show flow. Skull and upper cervical spine: Negative Sinuses/Orbits: Clear/normal Other: None MRA HEAD FINDINGS Both internal carotid arteries are widely patent through the skull base and siphon regions. The anterior and middle cerebral vessels  are normal without proximal stenosis, aneurysm or vascular malformation. Large left vertebral artery patent to the basilar. No antegrade flow seen in a right vertebral artery. No basilar stenosis. Posterior circulation branch vessels are patent. MRA NECK FINDINGS Brachiocephalic vessel origins from the arch are not included. Both common carotid arteries widely patent to the bifurcation. Both carotid bifurcations appear normal. Right cervical ICA is normal. Irregularity of the medial wall of the left cervical ICA, likely due to fibromuscular disease. 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 Dominant left vertebral artery shows 30-50% stenosis at its origin but wide patency beyond that to the basilar. Right vertebral artery is a severely diseased in thready vessel which does not show flow beyond the upper cervical  region. IMPRESSION: 1. 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 2. Mild chronic small-vessel ischemic change of the pons and cerebral hemispheric white matter otherwise. 3. Neck MR angiography does not show any significant carotid bifurcation disease. Irregularity of the medial wall of the left cervical ICA likely due to fibromuscular disease. 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 4. Dominant left vertebral artery shows 30-50% stenosis at its origin but wide patency beyond that to the basilar. Severely diseased right vertebral artery which does not show flow beyond the upper cervical region. Electronically Signed   By: Paulina FusiMark  Shogry M.D.   On: 04/28/2020 13:04   MR ANGIO NECK W WO CONTRAST  Result Date: 04/28/2020 CLINICAL DATA:  Right-sided weakness.  Dysarthria. EXAM: MRI HEAD WITHOUT AND WITH CONTRAST MRA HEAD WITHOUT CONTRAST MRA NECK WITHOUT AND WITH CONTRAST TECHNIQUE: Multiplanar, multiecho pulse sequences of the brain and surrounding structures were obtained without and with intravenous contrast. Angiographic images of the Circle of Willis were obtained using MRA technique without intravenous contrast. Angiographic images of the neck were obtained using MRA technique without and with intravenous contrast. Carotid stenosis measurements (when applicable) are obtained utilizing NASCET criteria, using the distal internal carotid diameter as the denominator. CONTRAST:  8.523mL GADAVIST GADOBUTROL 1 MMOL/ML IV SOLN COMPARISON:  Head CT yesterday. FINDINGS: MRI HEAD FINDINGS Brain: Diffusion imaging shows acute infarction in the right lateral medulla. No other acute finding. Mild chronic small-vessel ischemic changes affect pons. Mild chronic small-vessel ischemic changes affect the cerebral hemispheric deep white matter. Generalized age related atrophy. No large vessel territory infarction. No mass lesion, hemorrhage, hydrocephalus or extra-axial collection. Vascular: Major vessels at  the base of the brain show flow. Skull and upper cervical spine: Negative Sinuses/Orbits: Clear/normal Other: None MRA HEAD FINDINGS Both internal carotid arteries are widely patent through the skull base and siphon regions. The anterior and middle cerebral vessels are normal without proximal stenosis, aneurysm or vascular malformation. Large left vertebral artery patent to the basilar. No antegrade flow seen in a right vertebral artery. No basilar stenosis. Posterior circulation branch vessels are patent. MRA NECK FINDINGS Brachiocephalic vessel origins from the arch are not included. Both common carotid arteries widely patent to the bifurcation. Both carotid bifurcations appear normal. Right cervical ICA is normal. Irregularity of the medial wall of the left cervical ICA, likely due to fibromuscular disease. 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 Dominant left vertebral artery shows 30-50% stenosis at its origin but wide patency beyond that to the basilar. Right vertebral artery is a severely diseased in thready vessel which does not show flow beyond the upper cervical region. IMPRESSION: 1. 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 2. Mild chronic small-vessel ischemic change of the pons and cerebral hemispheric white matter otherwise. 3. Neck MR  angiography does not show any significant carotid bifurcation disease. Irregularity of the medial wall of the left cervical ICA likely due to fibromuscular disease. 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 4. Dominant left vertebral artery shows 30-50% stenosis at its origin but wide patency beyond that to the basilar. Severely diseased right vertebral artery which does not show flow beyond the upper cervical region. Electronically Signed   By: Paulina Fusi M.D.   On: 04/28/2020 13:04   MR BRAIN WO CONTRAST  Result Date: 04/28/2020 CLINICAL DATA:  Right-sided weakness.  Dysarthria. EXAM: MRI HEAD WITHOUT AND WITH CONTRAST MRA HEAD WITHOUT CONTRAST MRA  NECK WITHOUT AND WITH CONTRAST TECHNIQUE: Multiplanar, multiecho pulse sequences of the brain and surrounding structures were obtained without and with intravenous contrast. Angiographic images of the Circle of Willis were obtained using MRA technique without intravenous contrast. Angiographic images of the neck were obtained using MRA technique without and with intravenous contrast. Carotid stenosis measurements (when applicable) are obtained utilizing NASCET criteria, using the distal internal carotid diameter as the denominator. CONTRAST:  8.61mL GADAVIST GADOBUTROL 1 MMOL/ML IV SOLN COMPARISON:  Head CT yesterday. FINDINGS: MRI HEAD FINDINGS Brain: Diffusion imaging shows acute infarction in the right lateral medulla. No other acute finding. Mild chronic small-vessel ischemic changes affect pons. Mild chronic small-vessel ischemic changes affect the cerebral hemispheric deep white matter. Generalized age related atrophy. No large vessel territory infarction. No mass lesion, hemorrhage, hydrocephalus or extra-axial collection. Vascular: Major vessels at the base of the brain show flow. Skull and upper cervical spine: Negative Sinuses/Orbits: Clear/normal Other: None MRA HEAD FINDINGS Both internal carotid arteries are widely patent through the skull base and siphon regions. The anterior and middle cerebral vessels are normal without proximal stenosis, aneurysm or vascular malformation. Large left vertebral artery patent to the basilar. No antegrade flow seen in a right vertebral artery. No basilar stenosis. Posterior circulation branch vessels are patent. MRA NECK FINDINGS Brachiocephalic vessel origins from the arch are not included. Both common carotid arteries widely patent to the bifurcation. Both carotid bifurcations appear normal. Right cervical ICA is normal. Irregularity of the medial wall of the left cervical ICA, likely due to fibromuscular disease. 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 Dominant left vertebral  artery shows 30-50% stenosis at its origin but wide patency beyond that to the basilar. Right vertebral artery is a severely diseased in thready vessel which does not show flow beyond the upper cervical region. IMPRESSION: 1. 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 2. Mild chronic small-vessel ischemic change of the pons and cerebral hemispheric white matter otherwise. 3. Neck MR angiography does not show any significant carotid bifurcation disease. Irregularity of the medial wall of the left cervical ICA likely due to fibromuscular disease. 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 4. Dominant left vertebral artery shows 30-50% stenosis at its origin but wide patency beyond that to the basilar. Severely diseased right vertebral artery which does not show flow beyond the upper cervical region. Electronically Signed   By: Paulina Fusi M.D.   On: 04/28/2020 13:04   DG Swallowing Func-Speech Pathology  Result Date: 04/29/2020 Completed by Royetta Crochet, SLP Student Supervised and reviewed by Harlon Ditty MA CCC-SLP Objective Swallowing Evaluation: Type of Study: MBS-Modified Barium Swallow Study  Patient Details Name: Tyler Glass MRN: [id number] Date of Birth: 1933/12/14 Today's Date: 04/29/2020 Time: SLP Start Time (ACUTE ONLY): 0930 -SLP Stop Time (ACUTE ONLY): 0951 SLP Time Calculation (min) (ACUTE ONLY): 21 min Past Medical History: Past Medical History: Diagnosis Date .  Hypertension  Past Surgical History: No past surgical history on file. HPI: Jaquese Irving is a 86 y.o. male with PMH significant for HTN who presents with right sided weakness, unsteady gait and difficulty with speech and trouble swallowing and persistent hiccups. 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 Mild chronic small-vessel ischemic change of the pons and cerebral hemispheric white matter otherwise.  Subjective: alert, with spouse at bedside Assessment / Plan / Recommendation  CHL IP CLINICAL IMPRESSIONS 04/29/2020 Clinical Impression Pt was seen for MBS and demonstrates a moderate oropharyngeal dysphagia characterized by premature spillage, instances of penetration/aspiration, pharyngeal residue, and impaired UES opening secondary to reduced movement and duration of hyolaryngeal excursion. Premature spillage was observed with thin liquid as the material fell to the level of the pyriforms. Prior to initiating the swallow, a trace amount of thin liquid was penetrated into the laryngeal vestibule above the level of the vocal folds and was subsequently ejected. During the swallow, additional thin liquid fell below the level of the vocal folds and the pt demonstrated an immediate cough in attempt to eject the aspirate. The pt also demonstrated silent aspiration of thin liquid while attempting a chin tuck. No instances of penetration/aspiration occurred with any other POs. The pt demonstrates significant pharyngeal residue across POs in the valleculae, lateral channels, and pyriforms. In order to address pharyngeal residue, multiple swallows (3) were utilized. Additional compensatory strategies were attempted to address residue but were unsuccessful. Recommend dys 3 diet with nectar thick liquids and use of multiple swallows. SLP will continue to f/u acutely for diet toleration and advancement. SLP Visit Diagnosis Dysphagia, oropharyngeal phase (R13.12) Attention and concentration deficit following -- Frontal lobe and executive function deficit following -- Impact on safety and function Moderate aspiration risk   CHL IP TREATMENT RECOMMENDATION 04/29/2020 Treatment Recommendations Therapy as outlined in treatment plan below   Prognosis 04/29/2020 Prognosis for Safe Diet Advancement Good Barriers to Reach Goals Time Glass onset Barriers/Prognosis Comment -- CHL IP DIET RECOMMENDATION 04/29/2020 SLP Diet Recommendations Dysphagia 3 (Mech soft) solids;Nectar thick liquid Liquid Administration via  Cup Medication Administration Crushed with puree Compensations Multiple dry swallows after each bite/sip Postural Changes Seated upright at 90 degrees   CHL IP OTHER RECOMMENDATIONS 04/29/2020 Recommended Consults -- Oral Care Recommendations Oral care BID Other Recommendations --   No flowsheet data found.  CHL IP FREQUENCY AND DURATION 04/29/2020 Speech Therapy Frequency (ACUTE ONLY) min 2x/week Treatment Duration 2 weeks      CHL IP ORAL PHASE 04/29/2020 Oral Phase Impaired Oral - Pudding Teaspoon -- Oral - Pudding Cup -- Oral - Honey Teaspoon -- Oral - Honey Cup -- Oral - Nectar Teaspoon -- Oral - Nectar Cup -- Oral - Nectar Straw -- Oral - Thin Teaspoon -- Oral - Thin Cup Premature spillage;Delayed oral transit Oral - Thin Straw Premature spillage;Delayed oral transit Oral - Puree WFL Oral - Mech Soft -- Oral - Regular WFL Oral - Multi-Consistency -- Oral - Pill -- Oral Phase - Comment --  [facility] IP PHARYNGEAL PHASE 04/29/2020 Pharyngeal Phase Impaired Pharyngeal- Pudding Teaspoon -- Pharyngeal -- Pharyngeal- Pudding Cup -- Pharyngeal -- Pharyngeal- Honey Teaspoon -- Pharyngeal -- Pharyngeal- Honey Cup -- Pharyngeal -- Pharyngeal- Nectar Teaspoon -- Pharyngeal -- Pharyngeal- Nectar Cup -- Pharyngeal -- Pharyngeal- Nectar Straw Pharyngeal residue - valleculae;Pharyngeal residue - pyriform;Reduced anterior laryngeal mobility;Reduced laryngeal elevation;Lateral channel residue;Compensatory strategies attempted (with notebox) Pharyngeal -- Pharyngeal- Thin Teaspoon -- Pharyngeal -- Pharyngeal- Thin Cup Reduced anterior laryngeal mobility;Reduced laryngeal  elevation;Pharyngeal residue - valleculae;Lateral channel residue;Pharyngeal residue - pyriform;Penetration/Aspiration before swallow;Trace aspiration Pharyngeal Material enters airway, remains ABOVE vocal cords then ejected out Pharyngeal- Thin Straw Lateral channel residue;Pharyngeal residue - pyriform;Pharyngeal residue - valleculae;Reduced anterior laryngeal  mobility;Reduced laryngeal elevation;Penetration/Aspiration during swallow;Trace aspiration Pharyngeal Material enters airway, remains ABOVE vocal cords then ejected out Pharyngeal- Puree Lateral channel residue;Pharyngeal residue - pyriform;Pharyngeal residue - valleculae;Reduced laryngeal elevation;Reduced anterior laryngeal mobility Pharyngeal -- Pharyngeal- Mechanical Soft -- Pharyngeal -- Pharyngeal- Regular Reduced anterior laryngeal mobility;Reduced laryngeal elevation;Pharyngeal residue - valleculae;Pharyngeal residue - pyriform;Lateral channel residue Pharyngeal -- Pharyngeal- Multi-consistency -- Pharyngeal -- Pharyngeal- Pill -- Pharyngeal -- Pharyngeal Comment --  [facility] IP CERVICAL ESOPHAGEAL PHASE 04/29/2020 Cervical Esophageal Phase Impaired Pudding Teaspoon -- Pudding Cup -- Honey Teaspoon -- Honey Cup -- Nectar Teaspoon -- Nectar Cup -- Nectar Straw -- Thin Teaspoon -- Thin Cup -- Thin Straw -- Puree -- Mechanical Soft -- Regular -- Multi-consistency -- Pill -- Cervical Esophageal Comment -- Claudine Mouton 04/29/2020, 10:44 AM              ECHOCARDIOGRAM COMPLETE  Result Date: 04/28/2020    ECHOCARDIOGRAM REPORT   Patient Name:   Tyler Glass Date of Exam: 04/28/2020 Medical Rec #:  [id number]    Height:       73.0 in Accession #:    [id number]   Weight:       185.0 lb Date of Birth:  [birth year]    BSA:          2.082 m Patient Age:    86 years     BP:           125/80 mmHg Patient Gender: M            HR:           80 bpm. Exam Location:  Inpatient Procedure: 2D Echo Indications:    stroke 434.91  History:        Patient has no prior history of Echocardiogram examinations.                 Risk Factors:Hypertension.  Sonographer:    Celene Skeen RDCS (AE) Referring Phys: [phone] DAVID GIRGUIS IMPRESSIONS  1. Left ventricular ejection fraction, by estimation, is 60 to 65%. The left ventricle has normal function. The left ventricle has no regional wall motion abnormalities. Left ventricular  diastolic parameters are consistent with Grade I diastolic dysfunction (impaired relaxation).  2. Right ventricular systolic function is normal. The right ventricular size is normal.  3. The mitral valve is normal in structure. No evidence of mitral valve regurgitation. No evidence of mitral stenosis.  4. The aortic valve is normal in structure. Aortic valve regurgitation is not visualized. No aortic stenosis is present.  5. The inferior vena cava is normal in size with greater than 50% respiratory variability, suggesting right atrial pressure of 3 mmHg. FINDINGS  Left Ventricle: Left ventricular ejection fraction, by estimation, is 60 to 65%. The left ventricle has normal function. The left ventricle has no regional wall motion abnormalities. The left ventricular internal cavity size was normal in size. There is  no left ventricular hypertrophy. Left ventricular diastolic parameters are consistent with Grade I diastolic dysfunction (impaired relaxation). Normal left ventricular filling pressure. Right Ventricle: The right ventricular size is normal. No increase in right ventricular wall thickness. Right ventricular systolic function is normal. Left Atrium: Left atrial size was normal in size. Right Atrium: Right atrial size was normal in size. Pericardium: There is  no evidence of pericardial effusion. Mitral Valve: The mitral valve is normal in structure. No evidence of mitral valve regurgitation. No evidence of mitral valve stenosis. Tricuspid Valve: The tricuspid valve is normal in structure. Tricuspid valve regurgitation is not demonstrated. No evidence of tricuspid stenosis. Aortic Valve: The aortic valve is normal in structure. Aortic valve regurgitation is not visualized. No aortic stenosis is present. Pulmonic Valve: The pulmonic valve was normal in structure. Pulmonic valve regurgitation is not visualized. No evidence of pulmonic stenosis. Aorta: The aortic root is normal in size and structure. Venous: The  inferior vena cava is normal in size with greater than 50% respiratory variability, suggesting right atrial pressure of 3 mmHg. IAS/Shunts: No atrial level shunt detected by color flow Doppler.  LEFT VENTRICLE PLAX 2D LVIDd:         3.60 cm  Diastology LVIDs:         2.30 cm  LV e' medial:    3.05 cm/s LV PW:         1.10 cm  LV E/e' medial:  15.3 LV IVS:        0.90 cm  LV e' lateral:   7.29 cm/s LVOT diam:     2.10 cm  LV E/e' lateral: 6.4 LV SV:         47 LV SV Index:   23 LVOT Area:     3.46 cm  RIGHT VENTRICLE RV S prime:     12.50 cm/s TAPSE (M-mode): 1.6 cm LEFT ATRIUM           Index LA diam:      2.90 cm 1.39 cm/m LA Vol (A2C): 25.6 ml 12.30 ml/m  AORTIC VALVE LVOT Vmax:   64.60 cm/s LVOT Vmean:  51.900 cm/s LVOT VTI:    0.136 m  AORTA Ao Root diam: 3.20 cm MITRAL VALVE MV Area (PHT): 2.73 cm    SHUNTS MV Decel Time: 278 msec    Systemic VTI:  0.14 m MV E velocity: 46.70 cm/s  Systemic Diam: 2.10 cm MV A velocity: 61.30 cm/s MV E/A ratio:  0.76 Mihai Croitoru MD Electronically signed by Thurmon Fair MD Signature Date/Time: 04/28/2020/2:42:34 PM    Final

## 2020-05-01 NOTE — Progress Notes (Signed)
Inpatient Rehab Admissions Coordinator:   I have insurance authorization and a bed available for pt to admit to CIR today.  Dr. Thedore Mins in agreement.  Will let pt/family and TOC team know.   Estill Dooms, PT, DPT Admissions Coordinator [phone] 05/01/20  11:11 AM

## 2020-05-01 NOTE — Progress Notes (Signed)
  Speech Language Pathology Treatment: Dysphagia  Patient Details Name: Tyler Glass MRN: [id number] DOB: 12-Jul-1933 Today's Date: 05/01/2020 Time: 0321-2248 SLP Time Calculation (min) (ACUTE ONLY): 16 min  Assessment / Plan / Recommendation Clinical Impression  Pt was seen for dysphagia treatment. He was alert and cooperative during the session. He recalled the swallowing precautions which were recommended with specificity including the need to use 3 swallows per bite. However, he admitted that he does not always remember to use the strategies. He was seen with breakfast and consumed a meal of eggs, potatoes, grits, and nectar thick liquids. He typically demonstrated two swallows per bolus and followed 2-3 boluses with a liquid wash. Throat clearing was occasionally noted, but occurred most frequently when pt conversed during the meal. Increased throat clearing was demonstrated at the completion of the meal during the speech/language/cognition evaluation. It is recommended that the current diet be continued and SLP will follow for dysphagia treatment.    HPI HPI: Tyler Glass is a 86 y.o. male with PMH significant for HTN who presents with right sided weakness, unsteady gait and difficulty with speech and trouble swallowing and persistent hiccups. 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 Mild chronic small-vessel ischemic change of the pons and cerebral hemispheric white matter otherwise.      SLP Plan  Continue with current plan of care       Recommendations  Diet recommendations: Dysphagia 3 (mechanical soft);Nectar-thick liquid Liquids provided via: Cup Medication Administration: Crushed with puree Supervision: Patient able to self feed;Intermittent supervision to cue for compensatory strategies Compensations: Multiple dry swallows after each bite/sip Postural Changes and/or Swallow Maneuvers: Seated upright 90 degrees;Upright 30-60 min after meal                 Oral Care Recommendations: Oral care BID Follow up Recommendations: Inpatient Rehab SLP Visit Diagnosis: Dysphagia, unspecified (R13.10) Plan: Continue with current plan of care       [name] I. Vear Clock, MS, CCC-SLP Acute Rehabilitation Services Office number [phone] Pager [phone]               Scheryl Marten 05/01/2020, 10:00 AM

## 2020-05-02 ENCOUNTER — Inpatient Hospital Stay (HOSPITAL_COMMUNITY): Payer: Medicare HMO | Admitting: Physical Therapy

## 2020-05-02 ENCOUNTER — Inpatient Hospital Stay (HOSPITAL_COMMUNITY): Payer: Medicare HMO | Admitting: Occupational Therapy

## 2020-05-02 DIAGNOSIS — I6300 Cerebral infarction due to thrombosis of unspecified precerebral artery: Secondary | ICD-10-CM

## 2020-05-02 MED ORDER — BACLOFEN 10 MG PO TABS
10.0000 mg | ORAL_TABLET | Freq: Three times a day (TID) | ORAL | Status: DC | PRN
  Administered 2020-05-02: 10 mg via ORAL
  Filled 2020-05-02: qty 1

## 2020-05-02 MED ORDER — SODIUM CHLORIDE 0.9 % IV SOLN: INTRAVENOUS | Status: AC

## 2020-05-02 NOTE — Evaluation (Signed)
Physical Therapy Assessment and Plan  Patient Details  Name: Tyler Glass MRN: [id number] Date of Birth: 03/17/1934  PT Diagnosis: Abnormal posture, Abnormality of gait, Ataxia, Coordination disorder, Difficulty walking, Hemiparesis dominant, Impaired sensation and Muscle weakness Rehab Potential: Good ELOS: 2.5-3 weeks   Today's Date: 05/02/2020 PT Individual Time: 1105-1200 PT Individual Time Calculation (min): 55 min    Hospital Problem: Principal Problem:   CVA (cerebral vascular accident) [facility])   Past Medical History:  Past Medical History:  Diagnosis Date  . Hypertension    Past Surgical History: History reviewed. No pertinent surgical history.  Assessment & Plan Clinical Impression: Patient is a 86 y.o. year old  right-handed male with history of hypertension.  History taken from chart review and patient due to dysarthria and cognition.  Patient lives in [place], but plans to live with his daughter, who works, at discharge.  Two-level home half bath on main level.  Reportedly independent prior to admission.  Up until recently patient been living in [place] alone driving doing his own grocery shopping and yard work.  He has a daughter here in the area that works.  He presented on 04/28/2020 with right hemiparesis and dysarthria.  Head CT unremarkable for acute intracranial process.  Patient did not receive TPA.  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  MRA angiography does not show significant carotid bifurcation disease.  Admission chemistries glucose 172, creatinine 1.44, alcohol negative urine drug screen negative, hemoglobin A1c of 6.  Echocardiogram with ejection fraction of 60-65%, no wall motion abnormalities grade 1 diastolic dysfunction.  Currently maintained on aspirin and Plavix for CVA prophylaxis x3 weeks and aspirin alone.  Subcutaneous Lovenox for DVT prophylaxis.  Hospital course further  complicated by postop dysphagia, currently on a dysphagia #3 nectar thick liquid diet.  Therapy evaluations completed and patient was admitted for a comprehensive rehab program.  Patient transferred to CIR on 05/01/2020 .   Patient currently requires max assist with mobility secondary to muscle weakness and muscle joint tightness, decreased cardiorespiratoy endurance, impaired timing and sequencing, unbalanced muscle activation, ataxia, decreased coordination and decreased motor planning, decreased visual perceptual skills and decreased visual motor skills, decreased midline orientation, decreased attention, decreased awareness, decreased problem solving, decreased safety awareness and delayed processing and decreased sitting balance, decreased standing balance, decreased postural control and decreased balance strategies.  Prior to hospitalization, patient was independent  with mobility and lived with Daughter in a House home.  Home access is 3Stairs to enter.  Patient will benefit from skilled PT intervention to maximize safe functional mobility, minimize fall risk and decrease caregiver burden for planned discharge home with 24 hour assist.  Anticipate patient will benefit from follow up [facility] at discharge.  PT - End of Session Activity Tolerance: Tolerates 30+ min activity with multiple rests Endurance Deficit: Yes Endurance Deficit Description: requires intermittent seated rest breaks PT Assessment Rehab Potential (ACUTE/IP ONLY): Good PT Barriers to Discharge: Decreased caregiver support;Home environment access/layout;Nutrition means PT Barriers to Discharge Comments: only 1/2 bath on main level PT Patient demonstrates impairments in the following area(s): Balance;Motor;Safety;Behavior;Nutrition;Sensory;Pain;Skin Integrity;Endurance;Perception;Edema PT Transfers Functional Problem(s): Bed Mobility;Floor;Bed to Chair;Car;Furniture PT Locomotion Functional Problem(s): Ambulation;Stairs;Wheelchair  Mobility PT Plan PT Intensity: Minimum of 1-2 x/day ,45 to 90 minutes PT Frequency: 5 out of 7 days PT Duration Estimated Length of Stay: 2.5-3 weeks PT Treatment/Interventions: Ambulation/gait training;Balance/vestibular training;Cognitive remediation/compensation;Community reintegration;Discharge planning;Disease management/prevention;DME/adaptive equipment instruction;Functional electrical stimulation;Functional mobility training;Neuromuscular re-education;Pain management;Patient/family education;Psychosocial support;Splinting/orthotics;Skin care/wound  management;Stair training;Therapeutic Activities;Therapeutic Exercise;UE/LE Strength taining/ROM;UE/LE Coordination activities;Visual/perceptual remediation/compensation;Wheelchair propulsion/positioning PT Transfers Anticipated Outcome(s): CGA PT Locomotion Anticipated Outcome(s): CGA PT Recommendation Recommendations for Other Services: Therapeutic Recreation consult Therapeutic Recreation Interventions: Clinical cytogeneticist;Outing/community reintergration Follow Up Recommendations: Home health PT;24 hour supervision/assistance Patient destination: Home Equipment Recommended: To be determined   PT Evaluation Precautions/Restrictions Precautions Precautions: Fall;Other (comment) Precaution Comments: R hemiparesis with strong R lean Restrictions Weight Bearing Restrictions: No Pain Pain Assessment Pain Scale: 0-10 Pain Score: 0-No pain (states "only the hiccup") Home Living/Prior Functioning Home Living Available Help at Discharge: Family;Available PRN/intermittently (daughter works from home) Type of Home: House Home Access: Stairs to enter (according to pt daughter's home has 3 steps up onto a porch) Technical brewer of Steps: 3 Entrance Stairs-Rails: Right;Left Home Layout: Two level;1/2 bath on main level;Other (Comment) (pt repors rarely goes upstairs) Alternate Level Stairs-Number of Steps: flight Alternate  Level Stairs-Rails: Left;Right  Lives With: Daughter (planning to D/C to his daughter's home in [place], [place]) Prior Function Level of Independence: Independent with gait;Independent with transfers;Independent with homemaking with ambulation  Able to Take Stairs?: Yes Driving: Yes 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 Comments: lived alone in [place]; driving, grocery shopping, yard work all independently; daughter brought him to [facility] after discharged by [employer] Perception: Impaired Spatial Orientation: severely impaired midline orientation Praxis Praxis: Impaired Praxis Impairment Details: Motor planning  Cognition Overall Cognitive Status: No family/caregiver present to determine baseline cognitive functioning Arousal/Alertness: Awake/alert Orientation Level: Oriented to person;Oriented to place;Oriented to situation;Disoriented to time ("I don't name of the hospital but I know I'm in the hospital" states it is "2001") Attention: Focused;Sustained Focused Attention: Impaired Sustained Attention: Impaired Awareness: Impaired Problem Solving: Impaired Safety/Judgment: Impaired Sensation Sensation Light Touch: Appears Intact Hot/Cold: Not tested Proprioception: Impaired Detail Proprioception Impaired Details: Impaired RLE;Impaired LLE Stereognosis: Not tested Coordination Gross Motor Movements are Fluid and Coordinated: No Coordination and Movement Description: impaired coordination of B LEs during transfers Heel Shin Test: R LE decreased coordination Motor  Motor Motor: Abnormal postural alignment and control;Hemiplegia Motor - Skilled Clinical Observations: R hemiparesis with strong R lean due to impaired midline orientation   Trunk/Postural Assessment  Cervical Assessment Cervical Assessment: Exceptions to [facility] (forward head) Thoracic Assessment Thoracic Assessment: Exceptions to [facility] (mild kyphosis) Lumbar Assessment Lumbar Assessment: Exceptions  to [facility] (posterior pelvic tilt) Postural Control Postural Control: Deficits on evaluation Trunk Control: strong R anterolateral lean Righting Reactions: very poor motor planning for recovery with impaired midline orientation  Balance Balance Balance Assessed: Yes Static Sitting Balance Static Sitting - Balance Support: Bilateral upper extremity supported Static Sitting - Level of Assistance: 4: Min assist Dynamic Sitting Balance Dynamic Sitting - Level of Assistance: 3: Mod assist;4: Min Insurance risk surveyor Standing - Balance Support: During functional activity Static Standing - Level of Assistance: 3: Mod assist Dynamic Standing Balance Dynamic Standing - Balance Support: During functional activity Dynamic Standing - Level of Assistance: 3: Mod assist;2: Max assist;1: +1 Total assist Extremity Assessment      RLE Assessment RLE Assessment: Exceptions to [facility] Active Range of Motion (AROM) Comments: WFL RLE Strength Right Hip Flexion: 4-/5 Right Knee Flexion: 3+/5 Right Knee Extension: 4/5 Right Ankle Dorsiflexion: 4-/5 Right Ankle Plantar Flexion: 4-/5 RLE Tone RLE Tone: Within Functional Limits LLE Assessment LLE Assessment: Within Functional Limits Active Range of Motion (AROM) Comments: WFL General Strength Comments: in sitting demonstrates grossly 4+/5 during assessment but when ambulating noted to have ~5 instances of L  knee giving way  Care Tool Care Tool Bed Mobility Roll left and right activity   Roll left and right assist level: Supervision/Verbal cueing    Sit to lying activity   Sit to lying assist level: Supervision/Verbal cueing    Lying to sitting edge of bed activity   Lying to sitting edge of bed assist level: Supervision/Verbal cueing     Care Tool Transfers Sit to stand transfer   Sit to stand assist level: Moderate Assistance - Patient 50 - 74%    Chair/bed transfer   Chair/bed transfer assist level: Moderate Assistance -  Patient 50 - 74%     Physiological scientist transfer assist level: Maximal Assistance - Patient 25 - 49%      Care Tool Locomotion Ambulation   Assist level: Maximal Assistance - Patient 25 - 49% Assistive device: No Device Max distance: 70f  Walk 10 feet activity   Assist level: Maximal Assistance - Patient 25 - 49% Assistive device: No Device   Walk 50 feet with 2 turns activity Walk 50 feet with 2 turns activity did not occur: Safety/medical concerns      Walk 150 feet activity Walk 150 feet activity did not occur: Safety/medical concerns      Walk 10 feet on uneven surfaces activity Walk 10 feet on uneven surfaces activity did not occur: Safety/medical concerns      Stairs Stair activity did not occur: Safety/medical concerns        Walk up/down 1 step activity Walk up/down 1 step or curb (drop down) activity did not occur: Safety/medical concerns     Walk up/down 4 steps activity did not occuR: Safety/medical concerns  Walk up/down 4 steps activity      Walk up/down 12 steps activity Walk up/down 12 steps activity did not occur: Safety/medical concerns      Pick up small objects from floor Pick up small object from the floor (from standing position) activity did not occur: Safety/medical concerns      Wheelchair Will patient use wheelchair at discharge?:  (TBD)          Wheel 50 feet with 2 turns activity      Wheel 150 feet activity        Refer to Care Plan for Long Term Goals  SHORT TERM GOAL WEEK 1 PT Short Term Goal 1 (Week 1): Pt will consistently perform sit<>stand transfers with min assist PT Short Term Goal 2 (Week 1): Pt will consistently perform stand pivot transfers with min assist using LRAD PT Short Term Goal 3 (Week 1): Pt will ambulate at least 582fusing LRAD with mod assist PT Short Term Goal 4 (Week 1): Pt will initaite stair training  Recommendations for other services: Therapeutic Recreation  Kitchen group and  Outing/community reintegration  Skilled Therapeutic Intervention Evaluation completed (see details above) with patient education regarding purpose of PT evaluation, PT POC and goals, therapy schedule, weekly team meetings, and other CIR information including safety plan and fall risk safety. Pt received sitting in recliner and agreeable to therapy session. Pt noted to have mild R lateral trunk lean even while in supported sitting in recliner. Therapist provided pt w/c with a cushion and back support to increase upright, OOB activity tolerance. L stand pivot recliner>w/c with mod assist for balance while turning due to R anterior trunk lean and poor motor planning/coordination of LE stepping.  Transported to/from gym in w/c for  time management and energy conservation. Gait training ~77f, no AD, with heavy mod progressed to strong max assist for balance due to progressively worsening R lateral lean and worsening B LE coordination with pt stepping L LE out laterally and pushing towards R along with excessive R hip adduction causing narrow BOS off set towards the L resulting in worsening R lateral lean despite cuing for correction - also noted to have ~5 instances of L knee buckling/giving way but able to recover and continue ambulating. Therapist provided mirror feedback and education on poor midline orientation with pt demoing some improvement with the biofeedback but unable to sustain once initiated movement and mirror removed. Simulated car transfer small SUV height with mod/max assist for balance via squat pivot technique to/from w/c. Transported back to room and assessed bed mobility as described below. At end of session pt left seated in recliner with needs in reach, B LEs elevated, and chair alarm on.  Mobility Bed Mobility Bed Mobility: Supine to Sit;Sit to Supine Supine to Sit: Supervision/Verbal cueing Sit to Supine: Supervision/Verbal cueing Transfers Transfers: Sit to Stand;Stand to Sit;Stand  Pivot Transfers Sit to Stand: Moderate Assistance - Patient 50-74% Stand to Sit: Moderate Assistance - Patient 50-74% Stand Pivot Transfers: Moderate Assistance - Patient 50 - 74%;Maximal Assistance - Patient 25 - 49% Stand Pivot Transfer Details: Manual facilitation for placement;Manual facilitation for weight shifting;Verbal cues for technique;Verbal cues for sequencing;Verbal cues for gait pattern;Verbal cues for precautions/safety;Visual cues/gestures for precautions/safety;Tactile cues for posture;Tactile cues for sequencing;Tactile cues for weight shifting;Tactile cues for weight beaing;Tactile cues for initiation Stand Pivot Transfer Details (indicate cue type and reason): cuing for improved midline orientation due to strong R lean Transfer (Assistive device): None Locomotion  Gait Ambulation: Yes Gait Assistance: Maximal Assistance - Patient 25-49%;Moderate Assistance - Patient 50-74% Gait Distance (Feet): 38 Feet Assistive device: None Gait Assistance Details: Tactile cues for weight shifting;Manual facilitation for weight shifting;Manual facilitation for weight bearing;Manual facilitation for placement;Verbal cues for gait pattern;Verbal cues for sequencing;Verbal cues for technique;Visual cues/gestures for precautions/safety;Tactile cues for posture;Tactile cues for sequencing;Tactile cues for weight beaing Gait Gait: Yes Gait Pattern: Impaired Gait Pattern: Step-to pattern;Decreased step length - left;Decreased step length - right;Narrow base of support;Lateral trunk lean to right Stairs / Additional Locomotion Stairs: No Wheelchair Mobility Wheelchair Mobility: No   Discharge Criteria: Patient will be discharged from PT if patient refuses treatment 3 consecutive times without medical reason, if treatment goals not met, if there is a change in medical status, if patient makes no progress towards goals or if patient is discharged from hospital.  The above assessment, treatment  plan, treatment alternatives and goals were discussed and mutually agreed upon: by patient  CTawana Scale, PT, DPT, CSRS  05/02/2020, 7:56 AM

## 2020-05-02 NOTE — Plan of Care (Addendum)
  Problem: RH BOWEL ELIMINATION Goal: RH STG MANAGE BOWEL WITH ASSISTANCE Description: STG Manage Bowel with supervision Assistance. Outcome: Not Progressing; LBM 11/9; poor intake MD made aware; starting on IV fluids   Problem: RH BLADDER ELIMINATION Goal: RH STG MANAGE BLADDER WITH ASSISTANCE Description: STG Manage Bladder With supervision Assistance Outcome: Not Progressing; in and out cath q 8   Problem: RH KNOWLEDGE DEFICIT Goal: RH STG INCREASE KNOWLEDGE OF STROKE PROPHYLAXIS Description: Patient will be able to demonstrate knowledge on medication management, medications used for future stroke prevention, and follow up care with the MD post discharge with min assist from CIR staff. Outcome: Not Progressing; forgetful

## 2020-05-02 NOTE — Progress Notes (Signed)
Physical Therapy Session Note  Patient Details  Name: Tyler Glass MRN: [id number] Date of Birth: February 12, 1934  Today's Date: 05/02/2020 PT Individual Time: 4650-3546 PT Individual Time Calculation (min): 42 min   Short Term Goals: Week 1:  PT Short Term Goal 1 (Week 1): Pt will consistently perform sit<>stand transfers with min assist PT Short Term Goal 2 (Week 1): Pt will consistently perform stand pivot transfers with min assist using LRAD PT Short Term Goal 3 (Week 1): Pt will ambulate at least 67ft using LRAD with mod assist PT Short Term Goal 4 (Week 1): Pt will initaite stair training  Skilled Therapeutic Interventions/Progress Updates:    Pt received supine in bed and agreeable to therapy session. Supine>sitting R EOB, HOB partially elevated, with supervision. L squat pivot EOB>w/c with mod assist for trunk control/balance due to R lean.  Transported to/from gym in w/c for time management and energy conservation. L squat pivot w/c>EOM with same mod assist. Sitting EOM able to maintain balance with supervision for safety. Session focused on midline orientation during transition from sit<>stand and once in standing due to pt having strong R lean during the transition then strong R anterior trunk lean once initiating any movement of arms or readjustment of feet in standing requiring up to max assist to maintain upright. Mirror feedback provided. During sit>stand pt does best with mirror feedback and increased time to self-correct balance as opposed to therapist providing facilitation to decreased R lean. During standing>sit pt responded best to having external targets on mat to reach back to with L hand and sit L hip onto with cuing to turn head and look back at the targets - though still requires mod assist for balance does have improved midline orientation - also requires max multimodal cuing to initiate hip/knee flexion to sit down with pt having significant motor planning impairments. During  static standing with wide BOS pt able to maintain balance up to 20seconds with min assist with gradually worsening R lateral lean. Progressed to L lateral reaching task to promote L weight shift and lean but pt compensates via sticking R hip out and keeping R LE WBing requiring mod to max assist for balance. When pt attempts to readjust feet in standing for improved balance notice significantly impaired coordination with pt shifting both feet towards L causing even worsening R lateral trunk lean. R squat pivot EOM>w/c with mod assist for balance. Transported back to room and pt requesting to return to bed. R squat pivot as above. Sit>supine with supervision. Pt left supine in bed with needs in reach and bed alarm on.   Therapy Documentation Precautions:  Precautions Precautions: Fall, Other (comment) Precaution Comments: R hemiparesis with strong R lean Restrictions Weight Bearing Restrictions: No  Pain:   No reports of pain throughout session.  Therapy/Group: Individual Therapy  Ginny Forth , PT, DPT, CSRS  05/02/2020, 8:27 PM

## 2020-05-02 NOTE — Evaluation (Signed)
Occupational Therapy Assessment and Plan  Patient Details  Name: Tyler Glass MRN: [id number] Date of Birth: 07-28-1933  OT Diagnosis: abnormal posture, cognitive deficits, disturbance of vision and hemiplegia affecting dominant side Rehab Potential: Rehab Potential (ACUTE ONLY): Good ELOS: 18-21 days   Today's Date: 05/02/2020 OT Individual Time: 1000-1056 OT Individual Time Calculation (min): 56 min     Hospital Problem: Principal Problem:   CVA (cerebral vascular accident) [facility])   Past Medical History:  Past Medical History:  Diagnosis Date  . Hypertension    Past Surgical History: History reviewed. No pertinent surgical history.  Assessment & Plan Clinical Impression: Tyler Glass is an 86 year old right-handed male with history of hypertension.  History taken from chart review and patient due to dysarthria and cognition.  Patient lives in [place], but plans to live with his daughter, who works, at discharge.  Two-level home half bath on main level.  Reportedly independent prior to admission.  Up until recently patient been living in [place] alone driving doing his own grocery shopping and yard work.  He has a daughter here in the area that works.  He presented on 04/28/2020 with right hemiparesis and dysarthria.  Head CT unremarkable for acute intracranial process.  Patient did not receive TPA.  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  MRA angiography does not show significant carotid bifurcation disease.  Admission chemistries glucose 172, creatinine 1.44, alcohol negative urine drug screen negative, hemoglobin A1c of 6.  Echocardiogram with ejection fraction of 60-65%, no wall motion abnormalities grade 1 diastolic dysfunction.  Currently maintained on aspirin and Plavix for CVA prophylaxis x3 weeks and aspirin alone.  Subcutaneous Lovenox for DVT prophylaxis.  Hospital course further complicated by postop  dysphagia, currently on a dysphagia #3 nectar thick liquid diet. .  Patient transferred to CIR on 05/01/2020 .    Patient currently requires mod with basic self-care skills secondary to muscle weakness, decreased coordination and decreased motor planning, decreased visual motor skills, decreased midline orientation and decreased motor planning, decreased attention, decreased awareness, decreased problem solving and delayed processing and decreased sitting balance, decreased standing balance, decreased postural control, hemiplegia and decreased balance strategies.  Prior to hospitalization, patient could complete BADLs and IADLs with independent .  Patient will benefit from skilled intervention to increase independence with basic self-care skills prior to discharge home with care partner.  Anticipate patient will require 24 hour supervision and minimal physical assistance and follow up home health.  OT - End of Session Activity Tolerance: Tolerates 30+ min activity with multiple rests Endurance Deficit: Yes Endurance Deficit Description: requires intermittent seated rest breaks throughout basic self care OT Assessment Rehab Potential (ACUTE ONLY): Good OT Patient demonstrates impairments in the following area(s): Balance;Cognition;Endurance;Motor;Perception;Safety;Vision;Skin Integrity OT Basic ADL's Functional Problem(s): Bathing;Dressing;Toileting;Grooming OT Advanced ADL's Functional Problem(s): Simple Meal Preparation;Laundry;Light Housekeeping OT Transfers Functional Problem(s): Toilet;Tub/Shower OT Additional Impairment(s): Fuctional Use of Upper Extremity OT Plan OT Intensity: Minimum of 1-2 x/day, 45 to 90 minutes OT Frequency: 5 out of 7 days OT Duration/Estimated Length of Stay: 18-21 days OT Treatment/Interventions: Balance/vestibular training;DME/adaptive equipment instruction;Patient/family education;Therapeutic Activities;Wheelchair propulsion/positioning;Cognitive  remediation/compensation;Psychosocial support;Therapeutic Exercise;Functional mobility training;Self Care/advanced ADL retraining;UE/LE Strength taining/ROM;Discharge planning;Neuromuscular re-education;Skin care/wound managment;UE/LE Coordination activities;Disease mangement/prevention;Visual/perceptual remediation/compensation OT Basic Self-Care Anticipated Outcome(s): CGA-supervision OT Toileting Anticipated Outcome(s): CGA OT Bathroom Transfers Anticipated Outcome(s): CGA OT Recommendation Patient destination: Home Follow Up Recommendations: Other (comment) (HH versus OP) Equipment Recommended: To be determined   OT Evaluation Precautions/Restrictions  Precautions Precautions: Fall;Other (comment) Precaution Comments: R hemiparesis with strong R lean Restrictions Weight Bearing Restrictions: No General Chart Reviewed: Yes Pain Pain Assessment Pain Scale: 0-10 Pain Score: 0-No pain Home Living/Prior Functioning Home Living Family/patient expects to be discharged to:: Private residence Living Arrangements: Children Available Help at Discharge: Family, Available PRN/intermittently (dtr works from home) Type of Home: House Home Access: Stairs to enter Technical brewer of Steps: 3 Entrance Stairs-Rails: Right, Left Home Layout: Two level, 1/2 bath on main level Alternate Level Stairs-Number of Steps: flight Alternate Level Stairs-Rails: Left, Right Bathroom Shower/Tub: Other (comment)  Lives With: Daughter Prior Function Level of Independence: Independent with gait, Independent with transfers, Independent with homemaking with ambulation, Independent with basic ADLs  Able to Take Stairs?: Yes Driving: Yes 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 Comments: lived alone in [place]; driving, grocery shopping, yard work all independently; daughter brought him to [facility] after discharged by [facility] Vision/History: No visual deficits Patient Visual Report: No change  from baseline Vision Assessment?: Yes Ocular Range of Motion: Impaired-to be further tested in functional context Alignment/Gaze Preference: Within Defined Limits Tracking/Visual Pursuits: Impaired - to be further tested in functional context;Other (comment) (pt able to track during formal testing however pt not scanning during functional tasks) Saccades: Within functional limits Convergence: Within functional limits Perception  Perception: Impaired Spatial Orientation: severely impaired midline orientation Praxis Praxis: Impaired Praxis Impairment Details: Motor planning Cognition Overall Cognitive Status: No family/caregiver present to determine baseline cognitive functioning Arousal/Alertness: Awake/alert Orientation Level: Person;Place;Situation Person: Oriented Place: Oriented Situation: Oriented Year: 2021 Month: November Day of Week: Correct Memory: Impaired Memory Impairment: Decreased short term memory Decreased Short Term Memory: Verbal basic;Functional basic Immediate Memory Recall: Sock;Blue;Bed Memory Recall Sock: Without Cue Memory Recall Blue: With Cue Memory Recall Bed: With Cue Attention: Focused Focused Attention: Appears intact Sustained Attention: Impaired Awareness: Impaired Problem Solving: Impaired Safety/Judgment: Impaired Sensation Sensation Light Touch: Appears Intact Hot/Cold: Appears Intact Proprioception: Impaired Detail (significant right sided leaning in sitting and standing) Stereognosis: Not tested Coordination Gross Motor Movements are Fluid and Coordinated: No Fine Motor Movements are Fluid and Coordinated: Yes Coordination and Movement Description: Pt having difficulty coordinating BLE during stand pivot transfers Finger Nose Finger Test: WFL BUE Heel Shin Test: R LE decreased coordination Motor  Motor Motor: Abnormal postural alignment and control;Hemiplegia Motor - Skilled Clinical Observations: mild R hemiparesis with strong R  lean due to impaired midline orientation  Trunk/Postural Assessment  Cervical Assessment Cervical Assessment: Exceptions to [facility] (forward head) Thoracic Assessment Thoracic Assessment:  (mild kyphosis;flexible) Lumbar Assessment Lumbar Assessment:  (posterior pelvic tilt) Postural Control Postural Control: Deficits on evaluation Righting Reactions: strong right lateral lean  Balance Balance Balance Assessed: Yes Static Sitting Balance Static Sitting - Level of Assistance: 4: Min assist Dynamic Sitting Balance Dynamic Sitting - Level of Assistance: 4: Min assist Static Standing Balance Static Standing - Level of Assistance: 3: Mod assist Dynamic Standing Balance Dynamic Standing - Level of Assistance: 3: Mod assist Extremity/Trunk Assessment RUE Assessment RUE Assessment: Exceptions to [facility] Passive Range of Motion (PROM) Comments: WNL Active Range of Motion (AROM) Comments: WNL General Strength Comments: 4-/5 RUE LUE Assessment LUE Assessment: Within Functional Limits  Care Tool Care Tool Self Care Eating   Eating Assist Level: Set up assist    Oral Care    Oral Care Assist Level: Set up assist    Bathing   Body parts bathed by patient: Right arm;Left arm;Chest;Abdomen;Front perineal area;Right upper leg;Left upper leg;Right lower leg;Left  lower leg;Face Body parts bathed by helper: Buttocks   Assist Level: Moderate Assistance - Patient 50 - 74% (for standing balance)    Upper Body Dressing(including orthotics)   What is the patient wearing?: Pull over shirt   Assist Level: Minimal Assistance - Patient > 75%    Lower Body Dressing (excluding footwear)   What is the patient wearing?: Pants;Incontinence brief Assist for lower body dressing: Moderate Assistance - Patient 50 - 74%    Putting on/Taking off footwear   What is the patient wearing?: Non-skid slipper socks Assist for footwear: Minimal Assistance - Patient > 75%       Care Tool Toileting Toileting  activity   Assist for toileting: Moderate Assistance - Patient 50 - 74% (simulation only due to pt unable to avoid with attempt)     Care Tool Bed Mobility Roll left and right activity        Sit to lying activity        Lying to sitting edge of bed activity         Care Tool Transfers Sit to stand transfer        Chair/bed transfer         Toilet transfer   Assist Level: Moderate Assistance - Patient 50 - 74%     Care Tool Cognition Expression of Ideas and Wants Expression of Ideas and Wants: Some difficulty - exhibits some difficulty with expressing needs and ideas (e.g, some words or finishing thoughts) or speech is not clear   Understanding Verbal and Non-Verbal Content Understanding Verbal and Non-Verbal Content: Usually understands - understands most conversations, but misses some part/intent of message. Requires cues at times to understand   Memory/Recall Ability *first 3 days only Memory/Recall Ability *first 3 days only: Current season;That he or she is in a hospital/hospital unit    Refer to Care Plan for [place] 1 OT Short Term Goal 1 (Week 1): Pt will complete toilet transfer with min assist OT Short Term Goal 2 (Week 1): Pt will complete LB dressing with min assist OT Short Term Goal 3 (Week 1): Pt will complete LB bathing with min assist OT Short Term Goal 4 (Week 1): Pt will consistently maintain trunk to midline sitting unsupported with CGA and min VCs.  Recommendations for other services: None    Skilled Therapeutic Intervention ADL ADL Upper Body Bathing: Minimal assistance Where Assessed-Upper Body Bathing: Edge of bed Lower Body Bathing: Moderate assistance Where Assessed-Lower Body Bathing: Edge of bed Upper Body Dressing: Minimal assistance Where Assessed-Upper Body Dressing: Edge of bed Lower Body Dressing: Moderate assistance Where Assessed-Lower Body Dressing: Edge of bed Toileting: Moderate  assistance Where Assessed-Toileting: Bedside Commode Toilet Transfer: Moderate assistance Toilet Transfer Method: Stand pivot Toilet Transfer Equipment: Bedside commode Mobility  Bed Mobility Bed Mobility: Supine to Sit;Sit to Supine Supine to Sit: Supervision/Verbal cueing Sit to Supine: Supervision/Verbal cueing Transfers Sit to Stand: Moderate Assistance - Patient 50-74% Stand to Sit: Moderate Assistance - Patient 50-74%   Skilled Intervention: First session:  Pt supine in bed, agreeable to OT session.  Initial evaluation complete, collaborated with pt regarding POC, and educated pt regarding OT scope of practice.  Pt completed self care and functional mobility per above levels of assist.  Pt ended session seated in recliner, call bell in reach, seat alarm on.    Second session:  Pt sitting up in recliner, no c/o pain, agreeable to OT session.  Per nursing, pt  needing to return to bed in order to place IV.  Pt completed stand pivot recliner to EOB using RW with mod assist due to right lean and impaired motor coordination BLE needing step by step VCs for sequencing of transfer.  Pt completed sit to supine with supervision. Call bell in reach, bed alarm on.  Remainder of session deferred due to nursing placing IV.  30 minutes missed treatment.   Discharge Criteria: Patient will be discharged from OT if patient refuses treatment 3 consecutive times without medical reason, if treatment goals not met, if there is a change in medical status, if patient makes no progress towards goals or if patient is discharged from hospital.  The above assessment, treatment plan, treatment alternatives and goals were discussed and mutually agreed upon: by patient  Ezekiel Slocumb 05/02/2020, 1:34 PM

## 2020-05-02 NOTE — Progress Notes (Addendum)
Tyler Glass PHYSICAL MEDICINE & REHABILITATION PROGRESS NOTE   Subjective/Complaints:  Tyler Glass reports his main issue is hiccups- was up all night with them and didn't sleepy well- also hard to drink/eat due to hiccups.   Thinks needs to have BM- LBM 2 days ago-    ROS:  Tyler Glass denies SOB, abd pain, CP, N/V/C/D, and vision changes  Objective:   No results found. Recent Labs    05/01/20 1806  WBC 6.1  HGB 13.2  HCT 41.1  PLT 309   Recent Labs    05/01/20 1806  CREATININE 1.34*    Intake/Output Summary (Last 24 hours) at 05/02/2020 1028 Last data filed at 05/02/2020 0809 Gross per 24 hour  Intake 380 ml  Output 200 ml  Net 180 ml        Physical Exam: Vital Signs Blood pressure 134/68, pulse 88, temperature 98.6 F (37 C), resp. rate 18, height 6\' 1"  (1.854 m), SpO2 96 %.   Physical Exam Vitals reviewed.  Constitutional:  laying in bed- awake, but sleepy, not hiccuping currently, but c/o them, NAD HENT: conjugate gaze Cardiovascular: RRR Pulmonary: CTA B/L- no W/R/R- good air movement Abdominal: Soft, NT, ND, (+)BS -hypoactive BS Musculoskeletal:     Cervical back: Normal range of motion and neck supple.     Comments: No edema or tenderness in extremities  Skin:    General: Skin is warm and dry.  Neurological: alert, appropriate    Comments: Alert Makes eye contact with examiner.   Follows simple commands.   Moderate dysarthria Motor: 5/5 throughout Mild facial weakness  Psychiatric:    decreased STM memory- needs redirection occ.   Assessment/Plan: 1. Functional deficits which require 3+ hours per day of interdisciplinary therapy in a comprehensive inpatient rehab setting.  Physiatrist is providing close team supervision and 24 hour management of active medical problems listed below.  Physiatrist and rehab team continue to assess barriers to discharge/monitor patient progress toward functional and medical goals  Care Tool:  Bathing               Bathing assist       Upper Body Dressing/Undressing Upper body dressing        Upper body assist      Lower Body Dressing/Undressing Lower body dressing            Lower body assist       Toileting Toileting    Toileting assist       Transfers Chair/bed transfer  Transfers assist           Locomotion Ambulation   Ambulation assist              Walk 10 feet activity   Assist           Walk 50 feet activity   Assist           Walk 150 feet activity   Assist           Walk 10 feet on uneven surface  activity   Assist           Wheelchair     Assist               Wheelchair 50 feet with 2 turns activity    Assist            Wheelchair 150 feet activity     Assist          Blood pressure 134/68, pulse 88, temperature 98.6  F (37 C), resp. rate 18, height 6\' 1"  (1.854 m), SpO2 96 %.   Medical Problem List and Plan: 1.  Right side hemiparesis and dysarthria/dysphagia secondary to right lateral medullary infarct             -patient may shower             -ELOS/Goals: 12-16 days/Supervision             Admit to CIR 2.  Antithrombotics: -DVT/anticoagulation:  Lovenox             -antiplatelet therapy: Aspirin 81 mg daily, Plavix 75 mg daily x3 weeks and aspirin alone 3. Pain Management: Tylenol as needed 4. Mood: Provide emotional support             -antipsychotic agents: N/A 5. Neuropsych: This patient is not fully capable of making decisions on his own behalf. 6. Skin/Wound Care: Routine skin checks 7. Fluids/Electrolytes/Nutrition: Routine in and outs             CMP ordered.  11/13- has only had 150 cc out after 12 hours- required in/out cath- will write for IVFs NS 60cc per hour for next 2 days- since drinking so poorly- hates thickened liquids.  8.  Dysphagia.  Dysphagia #3 nectar liquids.  11/13- will recheck labs Monday to see if needs IVFs at night- not drinking thickened  liquids well per nursing staff.              Advance diet as tolerated 9.  Permissive hypertension.  Patient on Norvasc 2.5 mg daily and Zestoretic 10-12.5 mg daily prior to admission.  Resume as needed  11/13- BP well controlled currently- con't regimen for now             Monitor with increased mobility 10.  Hyperlipidemia: Lipitor 11. Hiccups-   11/13- wil try Baclofen 10 mg TID prn for hiccups- 12. Constipation  11/13- LBM 2 days ago- says can go, but has no scheduled laxatives- not on any constipating meds- will give 1 more day since not eating well- if no BM by tomorrow, will give Sorbitol/add laxatives.    LOS: 1 days A FACE TO FACE EVALUATION WAS PERFORMED  Tyler Glass 05/02/2020, 10:28 AM

## 2020-05-02 NOTE — Progress Notes (Addendum)
Poor oral fluid intake r/t refusing to drink nectar thick liquids. Tried several different flavors. Patient states he does not like any of them. Educated patient without improvement in behavior. Will continue to encourage fluid intake.   Addendum: Patient now states he can tolerate the flavor, but his hiccups is keeping him from being able to drink.

## 2020-05-03 ENCOUNTER — Inpatient Hospital Stay (HOSPITAL_COMMUNITY): Payer: Medicare HMO | Admitting: Speech Pathology

## 2020-05-03 MED ORDER — SENNA 8.6 MG PO TABS
2.0000 | ORAL_TABLET | Freq: Every day | ORAL | Status: DC
  Administered 2020-05-03 – 2020-05-25 (×23): 17.2 mg via ORAL
  Filled 2020-05-03 (×23): qty 2

## 2020-05-03 MED ORDER — SORBITOL 70 % SOLN
30.0000 mL | Freq: Every day | Status: DC | PRN
  Administered 2020-05-03: 30 mL via ORAL
  Filled 2020-05-03: qty 30

## 2020-05-03 MED ORDER — BACLOFEN 10 MG PO TABS: 10.0000 mg | ORAL_TABLET | Freq: Four times a day (QID) | ORAL | Status: DC

## 2020-05-03 MED ORDER — BACLOFEN 10 MG PO TABS
10.0000 mg | ORAL_TABLET | Freq: Three times a day (TID) | ORAL | Status: DC
  Administered 2020-05-03 – 2020-05-06 (×9): 10 mg via ORAL
  Filled 2020-05-03 (×9): qty 1

## 2020-05-03 NOTE — Progress Notes (Signed)
[place] PHYSICAL MEDICINE & REHABILITATION PROGRESS NOTE   Subjective/Complaints:  LBM 11/9 Has no Bowel meds- will order Senokot 2 tabs daily and sorbitol prn.   Still hiccuping, it appears nurses variable on giving baclofen- will schedule it.     ROS:  Pt denies SOB, abd pain, CP, N/V, and vision changes  Objective:   No results found. Recent Labs    05/01/20 1806  WBC 6.1  HGB 13.2  HCT 41.1  PLT 309   Recent Labs    05/01/20 1806  CREATININE 1.34*    Intake/Output Summary (Last 24 hours) at 05/03/2020 1241 Last data filed at 05/03/2020 0601 Gross per 24 hour  Intake 220 ml  Output 800 ml  Net -580 ml        Physical Exam: Vital Signs Blood pressure 129/83, pulse 84, temperature 98.2 F (36.8 C), resp. rate 18, height 6\' 1"  (1.854 m), SpO2 100 %.   Physical Exam Vitals reviewed.  Constitutional: laying in bed; supine, NAD- hiccuping constantly while I'm in the room. On IVFs HENT: conjugate gaze Cardiovascular: RRR Pulmonary: CTA B/L- no W/R/R- good air movement Has a juicy cough x1- but sounded clear Abdominal: Soft, NT, ND, (+)BS- hypoactive Musculoskeletal:     Cervical back: Normal range of motion and neck supple.     Comments: No edema or tenderness in extremities  Skin:    General: Skin is warm and dry.  Neurological: alert, appropriate    Comments: Alert Makes eye contact with examiner.   Follows simple commands.   Moderate dysarthria Motor: 5/5 throughout Mild facial weakness  Psychiatric:    decreased STM memory- needs redirection occ.   Assessment/Plan: 1. Functional deficits which require 3+ hours per day of interdisciplinary therapy in a comprehensive inpatient rehab setting.  Physiatrist is providing close team supervision and 24 hour management of active medical problems listed below.  Physiatrist and rehab team continue to assess barriers to discharge/monitor patient progress toward functional and medical goals  Care  Tool:  Bathing    Body parts bathed by patient: Right arm, Left arm, Chest, Abdomen, Front perineal area, Right upper leg, Left upper leg, Right lower leg, Left lower leg, Face   Body parts bathed by helper: Buttocks     Bathing assist Assist Level: Moderate Assistance - Patient 50 - 74% (for standing balance)     Upper Body Dressing/Undressing Upper body dressing   What is the patient wearing?: Hospital gown only, Pull over shirt    Upper body assist Assist Level: Moderate Assistance - Patient 50 - 74%    Lower Body Dressing/Undressing Lower body dressing      What is the patient wearing?: Underwear/pull up, Pants     Lower body assist Assist for lower body dressing: Moderate Assistance - Patient 50 - 74%     Toileting Toileting    Toileting assist Assist for toileting: Moderate Assistance - Patient 50 - 74% (simulation only due to pt unable to avoid with attempt)     Transfers Chair/bed transfer  Transfers assist     Chair/bed transfer assist level: Moderate Assistance - Patient 50 - 74%     Locomotion Ambulation   Ambulation assist      Assist level: Maximal Assistance - Patient 25 - 49% Assistive device: No Device Max distance: 89ft   Walk 10 feet activity   Assist  Walk 10 feet activity did not occur: N/A  Assist level: Maximal Assistance - Patient 25 - 49% Assistive device: No  Device   Walk 50 feet activity   Assist Walk 50 feet with 2 turns activity did not occur: Safety/medical concerns         Walk 150 feet activity   Assist Walk 150 feet activity did not occur: N/A         Walk 10 feet on uneven surface  activity   Assist Walk 10 feet on uneven surfaces activity did not occur: N/A         Wheelchair     Assist Will patient use wheelchair at discharge?:  (TBD)             Wheelchair 50 feet with 2 turns activity    Assist        Assist Level: Total Assistance - Patient < 25%   Wheelchair 150  feet activity     Assist          Blood pressure 129/83, pulse 84, temperature 98.2 F (36.8 C), resp. rate 18, height 6\' 1"  (1.854 m), SpO2 100 %.   Medical Problem List and Plan: 1.  Right side hemiparesis and dysarthria/dysphagia secondary to right lateral medullary infarct             -patient may shower             -ELOS/Goals: 12-16 days/Supervision             Admit to CIR 2.  Antithrombotics: -DVT/anticoagulation:  Lovenox             -antiplatelet therapy: Aspirin 81 mg daily, Plavix 75 mg daily x3 weeks and aspirin alone 3. Pain Management: Tylenol as needed 4. Mood: Provide emotional support             -antipsychotic agents: N/A 5. Neuropsych: This patient is not fully capable of making decisions on his own behalf. 6. Skin/Wound Care: Routine skin checks 7. Fluids/Electrolytes/Nutrition: Routine in and outs             CMP ordered.  11/13- has only had 150 cc out after 12 hours- required in/out cath- will write for IVFs NS 60cc per hour for next 2 days- since drinking so poorly- hates thickened liquids.  11/14- on IVFs- labs in AM- they are ordered until tomorrow AM- since NOT drinking well- hates nectart thick liquids AND hard to swallow with hiccups.   8.  Dysphagia.  Dysphagia #3 nectar liquids.  11/13- will recheck labs Monday to see if needs IVFs at night- not drinking thickened liquids well per nursing staff.  11/14- not drinking- started IVFs 11/13 in afternoon              Advance diet as tolerated 9.  Permissive hypertension.  Patient on Norvasc 2.5 mg daily and Zestoretic 10-12.5 mg daily prior to admission.  Resume as needed  11/13- BP well controlled currently- con't regimen for now             Monitor with increased mobility 10.  Hyperlipidemia: Lipitor 11. Hiccups-   11/13- wil try Baclofen 10 mg TID prn for hiccups-  11/14- changed to 10 mg TID scheduled 12. Constipation  11/13- LBM 2 days ago- says can go, but has no scheduled laxatives- not on  any constipating meds- will give 1 more day since not eating well- if no BM by tomorrow, will give Sorbitol/add laxatives.    11/14- gave sorbitol prn and added Senokot 2 tabs daily.   LOS: 2 days A FACE TO FACE EVALUATION WAS PERFORMED  Randy Whitener 05/03/2020, 12:41 PM

## 2020-05-03 NOTE — Evaluation (Signed)
Speech Language Pathology Assessment and Plan  Patient Details  Name: Tyler Glass MRN: [id number] Date of Birth: 1934/02/22  SLP Diagnosis: Cognitive Impairments;Dysphagia;Dysarthria  Rehab Potential: Good ELOS: 21 days    Today's Date: 05/03/2020 SLP Individual Time: 7616-0737 SLP Individual Time Calculation (min): 55 min   Hospital Problem: Principal Problem:   CVA (cerebral vascular accident) [facility])  Past Medical History:  Past Medical History:  Diagnosis Date  . Hypertension    Past Surgical History: History reviewed. No pertinent surgical history.  Assessment / Plan / Recommendation Clinical Impression   Tyler Glass is an 86 year old right-handed male with history of hypertension.  History taken from chart review and patient due to dysarthria and cognition.  Patient lives in [place], but plans to live with his daughter, who works, at discharge.  Two-level home half bath on main level.  Reportedly independent prior to admission.  Up until recently patient been living in [place] alone driving doing his own grocery shopping and yard work.  He has a daughter here in the area that works.  He presented on 04/28/2020 with right hemiparesis and dysarthria.  Head CT unremarkable for acute intracranial process.  Patient did not receive TPA.  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  MRA angiography does not show significant carotid bifurcation disease.  Admission chemistries glucose 172, creatinine 1.44, alcohol negative urine drug screen negative, hemoglobin A1c of 6.  Echocardiogram with ejection fraction of 60-65%, no wall motion abnormalities grade 1 diastolic dysfunction.  Currently maintained on aspirin and Plavix for CVA prophylaxis x3 weeks and aspirin alone.  Subcutaneous Lovenox for DVT prophylaxis.  Hospital course further complicated by postop dysphagia, currently on a dysphagia #3 nectar thick liquid diet.   Therapy evaluations completed and patient was admitted for a comprehensive rehab program.  Please see preadmission assessment from earlier today as well.  SLP evaluation was completed on 05/03/20 with results as follows:   Bedside swallow evaluation  Pt presents with decreased management of his secretions as evidenced by wet vocal quality and frequent coughing on saliva throughout evaluation.  Pt presented with grimacing and complaints of globus sensation when consuming sips of nectar thick liquids although he did utilize swallowing precautions with distant supervision cues and no overt s/s of aspiration.  Pt reports "some meals are easier than others" and that he ate 100% of his lunch today without difficulty.   Pt had immediate coughing with dry, solid textures.  Pt states that he has been selecting softer foods from dys 3 choices as he has been concerned by the frequency with which foods get "stuck."  For now, I recommend that pt remain on his currently prescribed diet with intermittent supervision for use of swallowing precautions.  Pt will need repeat instrumental assessment of swallowing function prior to advancement of textures in light of his neuro deficits s/p medullary infarct.    Cognitive-linguistic evaluation Pt presents with moderate cognitive deficits as verified by results on various subtests of the Cognistat standardized cognitive assessment.  Results are as follows: attention- 3/8, constructional ability- 0/6, memory- 8/12, calculations- 2/4.  Pt is hard of hearing and does not have hearing aids present which did impact his performance on similarities and judgment subtests and were abandoned as a result.  Pt also presents with a mild dysarthria resulting from low vocal intensity and imprecise articulation of consonants which impacts his intelligibility at the conversational level.  Pt also has prolonged hiccups and decreased  management of his secretions which also impacts his breath support  for speech.    Given the abovementioned deficits, pt would benefit from skilled ST while inpatient in order to maximize functional independence and reduce burden of care prior to discharge.  Anticipate that pt will need 24/7 supervision at discharge in addition to [place] follow up at next level of care.     Skilled Therapeutic Interventions          Cognitive-linguistic and bedside swallow evaluation completed with results and recommendations reviewed with patient.  ]  SLP Assessment  Patient will need skilled Speech Lanaguage Pathology Services during CIR admission    Recommendations  SLP Diet Recommendations: Dysphagia 3 (Mech soft);Nectar Liquid Administration via: Cup Medication Administration: Crushed with puree Supervision: Patient able to self feed;Intermittent supervision to cue for compensatory strategies Compensations: Multiple dry swallows after each bite/sip Postural Changes and/or Swallow Maneuvers: Seated upright 90 degrees;Upright 30-60 min after meal Oral Care Recommendations: Oral care BID Patient destination: Home Follow up Recommendations: Home Health SLP;24 hour supervision/assistance Equipment Recommended: To be determined    SLP Frequency 3 to 5 out of 7 days   SLP Duration  SLP Intensity  SLP Treatment/Interventions 84 days  Minumum of 1-2 x/day, 30 to 90 minutes  Cognitive remediation/compensation;Cueing hierarchy;Environmental controls;Dysphagia/aspiration precaution training;Internal/external aids;Speech/Language facilitation;Functional tasks;Patient/family education    Pain Pain Assessment Pain Scale: 0-10 Pain Score: 0-No pain  Prior Functioning Cognitive/Linguistic Baseline: Baseline deficits Baseline deficit details: mild memory deficits at baseline Type of Home: House  Lives With: Alone Available Help at Discharge: Family;Available PRN/intermittently Education: 9th grade per pt; 6th per daughter Vocation: Retired  SLP  Evaluation Cognition Overall Cognitive Status: Impaired/Different from baseline Arousal/Alertness: Awake/alert Orientation Level: Oriented X4 Attention: Sustained Sustained Attention: Appears intact Memory: Impaired Memory Impairment: Decreased recall of new information;Storage deficit;Retrieval deficit Awareness: Impaired Awareness Impairment: Emergent impairment Problem Solving: Impaired Problem Solving Impairment: Functional complex Safety/Judgment: Impaired  Comprehension Auditory Comprehension Overall Auditory Comprehension: Appears within functional limits for tasks assessed Expression Expression Primary Mode of Expression: Verbal Verbal Expression Overall Verbal Expression: Appears within functional limits for tasks assessed Oral Motor Oral Motor/Sensory Function Overall Oral Motor/Sensory Function: Mild impairment Facial ROM: Reduced right Facial Strength: Reduced right Facial Sensation: Reduced right Velum: Impaired right Motor Speech Overall Motor Speech: Impaired Respiration: Impaired Level of Impairment: Sentence Phonation: Low vocal intensity Resonance: Within functional limits Articulation: Impaired Level of Impairment: Conversation Intelligibility: Intelligibility reduced Conversation: 75-100% accurate Motor Planning: Witnin functional limits  Care Tool Care Tool Cognition Expression of Ideas and Wants Expression of Ideas and Wants: Some difficulty - exhibits some difficulty with expressing needs and ideas (e.g, some words or finishing thoughts) or speech is not clear   Understanding Verbal and Non-Verbal Content Understanding Verbal and Non-Verbal Content: Usually understands - understands most conversations, but misses some part/intent of message. Requires cues at times to understand   Memory/Recall Ability *first 3 days only Memory/Recall Ability *first 3 days only: Current season;That he or she is in a hospital/hospital unit     Bedside Swallowing  Assessment General Date of Onset: 04/25/20 Previous Swallow Assessment: MBS 04/29/20 Diet Prior to this Study: Dysphagia 3 (soft);Nectar-thick liquids Temperature Spikes Noted: No Respiratory Status: Room air History of Recent Intubation: No Behavior/Cognition: Alert;Cooperative;Pleasant mood Oral Cavity - Dentition: Dentures, top;Adequate natural dentition Vision: Functional for self-feeding Patient Positioning: Upright in bed Baseline Vocal Quality: Low vocal intensity Volitional Cough: Weak Volitional Swallow: Able to elicit  Oral Care Assessment   Ice Chips  Thin Liquid   Nectar Thick Nectar Thick Liquid: Impaired Pharyngeal Phase Impairments: Decreased hyoid-laryngeal movement;Cough - Delayed Honey Thick   Puree   Solid Solid: Impaired Pharyngeal Phase Impairments: Cough - Immediate;Decreased hyoid-laryngeal movement;Multiple swallows BSE Assessment Suspected Esophageal Findings Suspected Esophageal Findings: Globus sensation Risk for Aspiration Impact on safety and function: Moderate aspiration risk Other Related Risk Factors: Decreased management of secretions  Short Term Goals: Week 1: SLP Short Term Goal 1 (Week 1): Pt will consume dys 3 textures and nectar thick liquids with mod I use of swallowing precautions and minimal overt s/s of aspiration. SLP Short Term Goal 2 (Week 1): Pt will consume therapeutic trials of thin liquids with mod I use of swallowing precautions and no s/s of respiratory decompensation over 3 consecutive sessions. SLP Short Term Goal 3 (Week 1): Pt will utilize external aids to facilitate recall of daily information with min assist multimodal cues. SLP Short Term Goal 4 (Week 1): Pt will complete mildly complex functional tasks with min assist multimodal cues for functional problem solving. SLP Short Term Goal 5 (Week 1): Pt will utilize increased vocal intensity and overarticulation with supervision to achieve intelligibility at the  conversational level  Refer to Care Plan for Long Term Goals  Recommendations for other services: None   Discharge Criteria: Patient will be discharged from SLP if patient refuses treatment 3 consecutive times without medical reason, if treatment goals not met, if there is a change in medical status, if patient makes no progress towards goals or if patient is discharged from hospital.  The above assessment, treatment plan, treatment alternatives and goals were discussed and mutually agreed upon: by patient  Emilio Math 05/03/2020, 4:27 PM

## 2020-05-03 NOTE — Plan of Care (Signed)
LBM 11/9, PRN med given

## 2020-05-04 ENCOUNTER — Inpatient Hospital Stay (HOSPITAL_COMMUNITY): Payer: Medicare HMO | Admitting: Occupational Therapy

## 2020-05-04 ENCOUNTER — Inpatient Hospital Stay (HOSPITAL_COMMUNITY): Payer: Medicare HMO

## 2020-05-04 DIAGNOSIS — E876 Hypokalemia: Secondary | ICD-10-CM

## 2020-05-04 DIAGNOSIS — R7401 Elevation of levels of liver transaminase levels: Secondary | ICD-10-CM

## 2020-05-04 DIAGNOSIS — E8809 Other disorders of plasma-protein metabolism, not elsewhere classified: Secondary | ICD-10-CM

## 2020-05-04 DIAGNOSIS — I10 Essential (primary) hypertension: Secondary | ICD-10-CM

## 2020-05-04 DIAGNOSIS — E46 Unspecified protein-calorie malnutrition: Secondary | ICD-10-CM

## 2020-05-04 DIAGNOSIS — D62 Acute posthemorrhagic anemia: Secondary | ICD-10-CM

## 2020-05-04 DIAGNOSIS — I69391 Dysphagia following cerebral infarction: Secondary | ICD-10-CM

## 2020-05-04 LAB — CBC WITH DIFFERENTIAL/PLATELET
Abs Immature Granulocytes: 0.02 10*3/uL (ref 0.00–0.07)
Basophils Absolute: 0 10*3/uL (ref 0.0–0.1)
Basophils Relative: 1 %
Eosinophils Absolute: 0.4 10*3/uL (ref 0.0–0.5)
Eosinophils Relative: 5 %
HCT: 38.1 % — ABNORMAL LOW (ref 39.0–52.0)
Hemoglobin: 12.3 g/dL — ABNORMAL LOW (ref 13.0–17.0)
Immature Granulocytes: 0 %
Lymphocytes Relative: 26 %
Lymphs Abs: 1.8 10*3/uL (ref 0.7–4.0)
MCH: 29.4 pg (ref 26.0–34.0)
MCHC: 32.3 g/dL (ref 30.0–36.0)
MCV: 91.1 fL (ref 80.0–100.0)
Monocytes Absolute: 0.7 10*3/uL (ref 0.1–1.0)
Monocytes Relative: 10 %
Neutro Abs: 3.9 10*3/uL (ref 1.7–7.7)
Neutrophils Relative %: 58 %
Platelets: 299 10*3/uL (ref 150–400)
RBC: 4.18 MIL/uL — ABNORMAL LOW (ref 4.22–5.81)
RDW: 13.3 % (ref 11.5–15.5)
WBC: 6.7 10*3/uL (ref 4.0–10.5)
nRBC: 0 % (ref 0.0–0.2)

## 2020-05-04 LAB — COMPREHENSIVE METABOLIC PANEL
ALT: 82 U/L — ABNORMAL HIGH (ref 0–44)
AST: 78 U/L — ABNORMAL HIGH (ref 15–41)
Albumin: 3.2 g/dL — ABNORMAL LOW (ref 3.5–5.0)
Alkaline Phosphatase: 75 U/L (ref 38–126)
Anion gap: 8 (ref 5–15)
BUN: 9 mg/dL (ref 8–23)
CO2: 29 mmol/L (ref 22–32)
Calcium: 9.2 mg/dL (ref 8.9–10.3)
Chloride: 105 mmol/L (ref 98–111)
Creatinine, Ser: 1.22 mg/dL (ref 0.61–1.24)
GFR, Estimated: 58 mL/min — ABNORMAL LOW (ref >60)
Glucose, Bld: 114 mg/dL — ABNORMAL HIGH (ref 70–99)
Potassium: 3.4 mmol/L — ABNORMAL LOW (ref 3.5–5.1)
Sodium: 142 mmol/L (ref 135–145)
Total Bilirubin: 0.5 mg/dL (ref 0.3–1.2)
Total Protein: 6.2 g/dL — ABNORMAL LOW (ref 6.5–8.1)

## 2020-05-04 MED ORDER — BLOOD PRESSURE CONTROL BOOK
Freq: Once | Status: AC
  Filled 2020-05-04: qty 1

## 2020-05-04 MED ORDER — POTASSIUM CHLORIDE CRYS ER 20 MEQ PO TBCR
30.0000 meq | EXTENDED_RELEASE_TABLET | Freq: Two times a day (BID) | ORAL | Status: AC
  Administered 2020-05-04 – 2020-05-05 (×2): 30 meq via ORAL
  Filled 2020-05-04 (×2): qty 1

## 2020-05-04 MED ORDER — PROSOURCE PLUS PO LIQD
30.0000 mL | Freq: Two times a day (BID) | ORAL | Status: DC
  Administered 2020-05-04 – 2020-05-24 (×37): 30 mL via ORAL
  Filled 2020-05-04 (×35): qty 30

## 2020-05-04 NOTE — Progress Notes (Signed)
Patient ID: Tyler Glass, male   DOB: 07-08-1933, 86 y.o.   MRN: [id number] Met with the patient to review role of the nurse CM and collaboration with the SW to facilitate prep for discharge. Reviewed secondary risk factors including HTN, HLD , and DM along with prescription for DAPT for 3 weeks then just ASA solo per MD. Patient noted an understanding of the information reviewed and that his daughter needs a review of this information. Patient given handouts on risks; DASH diet and including dyslipidemia and carbohydrate counting. No other questions or concerns noted at present. Continue to follow along to discharge for educational needs. Margarito Liner

## 2020-05-04 NOTE — Progress Notes (Signed)
Patient Details  Name: Tyler Glass MRN: [id number] Date of Birth: Jan 27, 1934  Today's Date: 05/04/2020  Hospital Problems: Principal Problem:   CVA (cerebral vascular accident) [facility])  Past Medical History:  Past Medical History:  Diagnosis Date  . Hypertension    Past Surgical History: History reviewed. No pertinent surgical history. Social History:  reports that he has never smoked. He has never used smokeless tobacco. He reports current alcohol use. No history on file for drug use.  Family / Support Systems Marital Status: Married How Long?: Not living with wife, she lives close to daughter-15 mintues Patient Roles: Parent Children: Aliya-daughter [phone] Other Supports: Wife will help some also Anticipated Caregiver: Aliya daughter Ability/Limitations of Caregiver: Daughter is self employed and can be flexible Caregiver Availability: Other (Comment) (Will do what is needed by Dad) Family Dynamics: Pt is close with his daughter and her family, he will stay up here upon discharge and not go back to [facility]. He was lonely and needed someone to look out for him and will now. His wife will assist he gets along with her.  Social History Preferred language: English Religion:  Cultural Background: No issues Education: HS Read: Yes Write: Yes Employment Status: Retired Marine scientist Issues: No issues Guardian/Conservator: None-according to MD pt is not fully capable of making his own decisions while here, will look toward wife since he is not divorced and daughter if any decisions need to be made while here   Abuse/Neglect Abuse/Neglect Assessment Can Be Completed: Yes Physical Abuse: Denies Verbal Abuse: Denies Sexual Abuse: Denies Exploitation of patient/patient's resources: Denies Self-Neglect: Denies  Emotional Status Pt's affect, behavior and adjustment status: Pt is motivated to do well, he has always been independent and taken care of himself until  this happen. He is glad to be here and plans to stay with daughter when discharged. Recent Psychosocial Issues: other health issues-[facility] and they discharged after having a stroke. Daughter brought him up here to be seen and evaluated. Psychiatric History: No issues-deferred depression screen due to seems to be coping appropriately and surrounded by family now. Will ask team if needs to be seen while here Substance Abuse History: No issues  Patient / Family Perceptions, Expectations & Goals Pt/Family understanding of illness & functional limitations: Pt and daughter are able to explain his stroke and deficits from that. Both do talk with the MD and feel they have a good understanding of treatment plan going forward. Pt is making progress and this encourages him Premorbid pt/family roles/activities: Father, grandfather, veteran, husband, friend, etc Anticipated changes in roles/activities/participation: resume Pt/family expectations/goals: Pt states: " I hope to be able to do on my own before I leave here."  Daughter states: " We are glad he is here and doing well."  [employer]: Other (Comment) ([facility]) Premorbid Home Care/DME Agencies: None Transportation available at discharge: Self and daughter and her husband  Discharge Planning Living Arrangements: Children Support Systems: Spouse/significant other, Children, Other relatives Type of Residence: Private residence Insurance Resources: Media planner (specify) ([employer] Medicare) Financial Resources: Social Security Financial Screen Referred: No Living Expenses: Own Money Management: Patient Does the patient have any problems obtaining your medications?: No Home Management: Self Patient/Family Preliminary Plans: Plans to stay with daughter and her family at discharge and plans to stay with them. he voiced he was lonely all by himself in [facility] and needs to be close to fmaily now he is 86 years  old.  Daughter has a lexible schedule and his wife will help some also. Care Coordinator Anticipated Follow Up Needs: HH/OP  Clinical Impression Pleasant gentleman who is glad to be here with her daughter and her family he was lonely down in [facility] on his own. His wife which he has never divorced is local also and will help their daughter. Pt is ok with this. Will work on safe discharge plan. Daughter is willing to do what is needed for Dad.  Lucy Chris 05/04/2020, 10:12 AM

## 2020-05-04 NOTE — Progress Notes (Signed)
Inpatient Rehabilitation  Patient information reviewed and entered into eRehab system by Chondra Boyde M. Fatina Sprankle, M.A., CCC/SLP, PPS Coordinator.  Information including medical coding, functional ability and quality indicators will be reviewed and updated through discharge.    

## 2020-05-04 NOTE — Progress Notes (Signed)
Occupational Therapy Session Note  Patient Details  Name: Tyler Glass MRN: [id number] Date of Birth: 03-18-1934  Today's Date: 05/04/2020 OT Individual Time: 1345-1445 OT Individual Time Calculation (min): 60 min    Short Term Goals: Week 1:  OT Short Term Goal 1 (Week 1): Pt will complete toilet transfer with min assist OT Short Term Goal 2 (Week 1): Pt will complete LB dressing with min assist OT Short Term Goal 3 (Week 1): Pt will complete LB bathing with min assist OT Short Term Goal 4 (Week 1): Pt will consistently maintain trunk to midline sitting unsupported with CGA and min VCs.  Skilled Therapeutic Interventions/Progress Updates:    Patient in bed, alert and ready for therapy session.  He is able to recall swallow strategies, denies pain.  Supine to sitting edge of bed with CS and cues for midline orientation (right lean tendency)  Sit to stand at edge of bed mod A.  Sit pivot transfer to w/c with min A.  Sit pivot transfer to/from mat table and w/c with CGA.   Completed seated balance, midline orientation, reaching activities, core strengthening, coordination activities with CS and good results - he is able to use visual and tactile strategies for improved posture and balance.  Completed pipe tree design copy task - 13 pieces with min cues using both right and left hands.  [facility] without difficulty.  Sit to stand from mat and standing balance/stepping/weight shift/reaching with CGA/min A.  Returned to bed at close of session with min A for sit pivot and CS for sit to supine.  Bed alarm set and call bell in hand.    Therapy Documentation Precautions:  Precautions Precautions: Fall, Other (comment) Precaution Comments: R hemiparesis with strong R lean Restrictions Weight Bearing Restrictions: No   Therapy/Group: Individual Therapy  Barrie Lyme 05/04/2020, 9:48 AM

## 2020-05-04 NOTE — Progress Notes (Signed)
Speech Language Pathology Daily Session Note  Patient Details  Name: Tyler Glass MRN: [id number] Date of Birth: Apr 21, 1934  Today's Date: 05/04/2020 SLP Individual Time: 1103-1200 SLP Individual Time Calculation (min): 57 min  Short Term Goals: Week 1: SLP Short Term Goal 1 (Week 1): Pt will consume dys 3 textures and nectar thick liquids with mod I use of swallowing precautions and minimal overt s/s of aspiration. SLP Short Term Goal 2 (Week 1): Pt will consume therapeutic trials of thin liquids with mod I use of swallowing precautions and no s/s of respiratory decompensation over 3 consecutive sessions. SLP Short Term Goal 3 (Week 1): Pt will utilize external aids to facilitate recall of daily information with min assist multimodal cues. SLP Short Term Goal 4 (Week 1): Pt will complete mildly complex functional tasks with min assist multimodal cues for functional problem solving. SLP Short Term Goal 5 (Week 1): Pt will utilize increased vocal intensity and overarticulation with supervision to achieve intelligibility at the conversational level  Skilled Therapeutic Interventions:Skilled ST services focused on swallow skills. Pt was able to recall multiple swallow strategy with min A verbal cues and utilized strategy when consuming thin liquid trials with supervision A verbal cues. Pt completed oral care with set up assist. Pt consumed thin liquids via small cup sips. Pt demonstrated mild delay in swallow initiation and demonstrated delayed throat clearing following trials and throughout the session without PO intake. Pt utilized suction occasionally to remove secretions. Pt appeared to have little understanding pertaining to current swallow deficits and noted burning and globus sensation. SLP provided education reviewing most recent MBS. SLP explained swallow function and current deficits supported with images of anatomical structures utilizing in swallowing. SLP provided instruction pertaining to  dysphagia plan to increase hypolaryngeal excursion with CTAR and reduced premature spillage with Masako exercises and trials of thin liquids piror to repeat MBS. Pt was able to complete x3 CTAR with mod A multimodal cues. SLP also provided education for possible GERD, separating solids and liquids and remaining up right after meals. Pt asked appropriate questions, however continued education is needs. Pt was left in room with call bell within reach and chair alarm set. SLP recommends to continue skilled services.     Pain Pain Assessment Pain Scale: 0-10 Pain Score: 0-No pain  Therapy/Group: Individual Therapy  Eternity Dexter  [facility] 05/04/2020, 12:38 PM

## 2020-05-04 NOTE — Progress Notes (Signed)
[facility] PHYSICAL MEDICINE & REHABILITATION PROGRESS NOTE   Subjective/Complaints: Patient seen sitting up in bed this morning, work with therapies.  He states he slept well overnight.  He states a good weekend.  He denies complaints.  He notes resolution of hiccups.  ROS: Denies CP, SOB, N/V/D  Objective:   No results found. Recent Labs    05/01/20 1806 05/04/20 0841  WBC 6.1 6.7  HGB 13.2 12.3*  HCT 41.1 38.1*  PLT 309 299   Recent Labs    05/01/20 1806 05/04/20 0841  NA  --  142  K  --  3.4*  CL  --  105  CO2  --  29  GLUCOSE  --  114*  BUN  --  9  CREATININE 1.34* 1.22  CALCIUM  --  9.2    Intake/Output Summary (Last 24 hours) at 05/04/2020 1327 Last data filed at 05/04/2020 0743 Gross per 24 hour  Intake 1985.3 ml  Output 500 ml  Net 1485.3 ml        Physical Exam: Vital Signs Blood pressure (!) 136/97, pulse 80, temperature 97.6 F (36.4 C), temperature source Oral, resp. rate 17, height 6\' 1"  (1.854 m), SpO2 98 %. Constitutional: No distress . Vital signs reviewed. HENT: Normocephalic.  Atraumatic. Eyes: EOMI. No discharge. Cardiovascular: No JVD.  RRR. Respiratory: Normal effort.  No stridor.  Bilateral clear to auscultation. GI: Non-distended.  BS +. Skin: Warm and dry.  Intact. Psych: Normal mood.  Normal behavior. Musc: No edema in extremities.  No tenderness in extremities. Neuro: Alert and oriented x2 Moderate dysarthria, unchanged Motor: 5/5 throughout, unchanged Mild facial weakness   Assessment/Plan: 1. Functional deficits which require 3+ hours per day of interdisciplinary therapy in a comprehensive inpatient rehab setting.  Physiatrist is providing close team supervision and 24 hour management of active medical problems listed below.  Physiatrist and rehab team continue to assess barriers to discharge/monitor patient progress toward functional and medical goals  Care Tool:  Bathing    Body parts bathed by patient: Right  arm, Left arm, Chest, Abdomen, Front perineal area, Right upper leg, Left upper leg, Right lower leg, Left lower leg, Face   Body parts bathed by helper: Buttocks     Bathing assist Assist Level: Moderate Assistance - Patient 50 - 74% (for standing balance)     Upper Body Dressing/Undressing Upper body dressing   What is the patient wearing?: Hospital gown only, Pull over shirt    Upper body assist Assist Level: Moderate Assistance - Patient 50 - 74%    Lower Body Dressing/Undressing Lower body dressing      What is the patient wearing?: Incontinence brief     Lower body assist Assist for lower body dressing: Moderate Assistance - Patient 50 - 74%     Toileting Toileting    Toileting assist Assist for toileting: 2 Helpers     Transfers Chair/bed transfer  Transfers assist     Chair/bed transfer assist level: Moderate Assistance - Patient 50 - 74%     Locomotion Ambulation   Ambulation assist      Assist level: Maximal Assistance - Patient 25 - 49% Assistive device: No Device Max distance: 22ft   Walk 10 feet activity   Assist  Walk 10 feet activity did not occur: N/A  Assist level: Maximal Assistance - Patient 25 - 49% Assistive device: No Device   Walk 50 feet activity   Assist Walk 50 feet with 2 turns activity did not  occur: Safety/medical concerns         Walk 150 feet activity   Assist Walk 150 feet activity did not occur: N/A         Walk 10 feet on uneven surface  activity   Assist Walk 10 feet on uneven surfaces activity did not occur: N/A         Wheelchair     Assist Will patient use wheelchair at discharge?:  (TBD)             Wheelchair 50 feet with 2 turns activity    Assist        Assist Level: Total Assistance - Patient < 25%   Wheelchair 150 feet activity     Assist          Blood pressure (!) 136/97, pulse 80, temperature 97.6 F (36.4 C), temperature source Oral, resp. rate  17, height 6\' 1"  (1.854 m), SpO2 98 %.   Medical Problem List and Plan: 1.  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  Antithrombotics: -DVT/anticoagulation:  Lovenox             -antiplatelet therapy: Aspirin 81 mg daily, Plavix 75 mg daily x3 weeks and aspirin alone 3. Pain Management: Tylenol as needed 4. Mood: Provide emotional support             -antipsychotic agents: N/A 5. Neuropsych: This patient is not fully capable of making decisions on his own behalf. 6. Skin/Wound Care: Routine skin checks 7. Fluids/Electrolytes/Nutrition: Routine in and outs 8.    Post stroke dysphagia.  Dysphagia #3 nectar liquids.             Advance diet as tolerated 9.    Essential hypertension.  Patient on Norvasc 2.5 mg daily and Zestoretic 10-12.5 mg daily prior to admission.  Resume as needed  Controlled on 11/15   Monitor with increased mobility 10.  Hyperlipidemia: Lipitor 11. Hiccups-   Continue baclofen 10 mg TID, will consider weaning 12. Constipation  Improving 13.  Hypokalemia  Potassium 3.4 on 11/15  Supplemented x1 day 14.  Transaminitis  LFTs elevated on 11/15, continue to monitor 15.  Hypoalbuminemia  Supplement initiated on 11/15 16.  AKI versus CKD  GFR 58 on 11/15  Continue to monitor 17.  Acute blood loss anemia  Hemoglobin 12.3 on 11/15  Continue to monitor  LOS: 3 days A FACE TO FACE EVALUATION WAS PERFORMED  Tyler Glass 12/15 05/04/2020, 1:27 PM

## 2020-05-04 NOTE — Progress Notes (Signed)
Inpatient Rehabilitation Center Individual Statement of Services  Patient Name:  Tyler Glass  Date:  05/04/2020  Welcome to the Inpatient Rehabilitation Center.  Our goal is to provide you with an individualized program based on your diagnosis and situation, designed to meet your specific needs.  With this comprehensive rehabilitation program, you will be expected to participate in at least 3 hours of rehabilitation therapies Monday-Friday, with modified therapy programming on the weekends.  Your rehabilitation program will include the following services:  Physical Therapy (PT), Occupational Therapy (OT), Speech Therapy (ST), 24 hour per day rehabilitation nursing, Neuropsychology, Care Coordinator, Rehabilitation Medicine, Nutrition Services and Pharmacy Services  Weekly team conferences will be held on Wednesday to discuss your progress.  Your Inpatient Rehabilitation Care Coordinator will talk with you frequently to get your input and to update you on team discussions.  Team conferences with you and your family in attendance may also be held.  Expected length of stay: 18-21 days  Overall anticipated outcome: CGA level  Depending on your progress and recovery, your program may change. Your Inpatient Rehabilitation Care Coordinator will coordinate services and will keep you informed of any changes. Your Inpatient Rehabilitation Care Coordinator's name and contact numbers are listed  below.  The following services may also be recommended but are not provided by the Inpatient Rehabilitation Center:   Driving Evaluations  Home Health Rehabiltiation Services  Outpatient Rehabilitation Services    Arrangements will be made to provide these services after discharge if needed.  Arrangements include referral to agencies that provide these services.  Your insurance has been verified to be:  [employer] Your primary doctor is:  [place] in [facility] will need to transfer to here  Pertinent information  will be shared with your doctor and your insurance company.  Inpatient Rehabilitation Care Coordinator:  Dossie Der, [place] [phone] or [phone]  Information discussed with and copy given to patient by: Lucy Chris, 05/04/2020, 10:00 AM

## 2020-05-04 NOTE — Progress Notes (Signed)
Physical Therapy Session Note  Patient Details  Name: Tyler Glass MRN: [id number] Date of Birth: May 06, 1934  Today's Date: 05/04/2020 PT Individual Time: 8841-6606 PT Individual Time Calculation (min): 75 min   Short Term Goals: Week 1:  PT Short Term Goal 1 (Week 1): Pt will consistently perform sit<>stand transfers with min assist PT Short Term Goal 2 (Week 1): Pt will consistently perform stand pivot transfers with min assist using LRAD PT Short Term Goal 3 (Week 1): Pt will ambulate at least 24ft using LRAD with mod assist PT Short Term Goal 4 (Week 1): Pt will initaite stair training  Skilled Therapeutic Interventions/Progress Updates:    Pt received supine in bed, awake and agreeable to PT session without reports of pain. Does report h/o "hiccups" but unable to clarify. Pt alert and oriented to all but location and exact date, unaware he was in [place] or the name of the Hospital. Donned pants with minA while supine in bed, assist for threading BLE's but able to bridge hips to pull pants over bottom. Supine<>sit with minA with HOB flat, using bed rails. Noted moderate R lateral lean in initial sitting, able to correct with mod cues. Squat<>pivot transfer with min/modA from EOB to w/c, towards his L side. W/c transport for time management to main therapy gym. Squat<>pivot with minA from w/c to mat table, cues for head/hip and general sequencing. Performed NMR for sitting balance with mirror in front for visual feedback, including: -2x10 L lateral elbow leans  -2x10 thoracic rotations with 6lb med ball -2x10 chop/lift patterns with 3lb med ball -2x10 seated toe taps on 3inch platform, focusing on coordination and sitting balance  He also performed NMR for standing balance with mirror for visual feedback including: -3x10 sit<>stands with min/modA  -unsupported static standing with verbal/visual/tactile cueing for L lateral weight shift, min/modA -Supported static standing with midline  reaching with RUE to target to facilitate L lateral weight shift, min/modA -unsupported standing with upward reaching to horseshoes over mirror to facilitate erect posture, min/modA -unsupported standing with upward reach with 3lb med ball, modA -Attempted standing toe taps on 3inch platform with RW support however limited ability to complete 2/2 heavy R lateral lean  Pt demonstrated brief episodes of ability to correct his STRONG R lateral lean/pushing during session but limited ability to carryover into functional tasks. Pt able to verbally express his decreased midline orientation but struggles with motor planning in order to correct. Squat<>pivot transfer back to his w/c and returned to his room where he remained seated in chair with chair alarm on, needs in reach.  Therapy Documentation Precautions:  Precautions Precautions: Fall, Other (comment) Precaution Comments: R hemiparesis with strong R lean Restrictions Weight Bearing Restrictions: No  Therapy/Group: Individual Therapy  Tifanny Dollens P Maire Govan PT 05/04/2020, 10:06 AM

## 2020-05-05 ENCOUNTER — Inpatient Hospital Stay (HOSPITAL_COMMUNITY): Payer: Medicare HMO | Admitting: Physical Therapy

## 2020-05-05 ENCOUNTER — Inpatient Hospital Stay (HOSPITAL_COMMUNITY): Payer: Medicare HMO

## 2020-05-05 ENCOUNTER — Inpatient Hospital Stay (HOSPITAL_COMMUNITY): Payer: Medicare HMO | Admitting: Occupational Therapy

## 2020-05-05 DIAGNOSIS — R066 Hiccough: Secondary | ICD-10-CM

## 2020-05-05 NOTE — Progress Notes (Signed)
Physical Therapy Session Note  Patient Details  Name: Tyler Glass MRN: [id number] Date of Birth: 1933/08/05  Today's Date: 05/05/2020 PT Individual Time: 8850-2774 PT Individual Time Calculation (min): 53 min   Short Term Goals: Week 1:  PT Short Term Goal 1 (Week 1): Pt will consistently perform sit<>stand transfers with min assist PT Short Term Goal 2 (Week 1): Pt will consistently perform stand pivot transfers with min assist using LRAD PT Short Term Goal 3 (Week 1): Pt will ambulate at least 19ft using LRAD with mod assist PT Short Term Goal 4 (Week 1): Pt will initaite stair training  Skilled Therapeutic Interventions/Progress Updates:    Pt received sitting in w/c and agreeable to therapy session.  Transported to/from gym in w/c for time management and energy conservation. L squat pivot w/c>EOM with min assist for balance due to R lateral trunk lean. Sitting EOM able to maintain static balance with supervision for safety. Sit>stands with cuing for pt to push up with B UEs to facilitate improved midline orientation with mirror feedback - pt demos ability to come to standing and achieve midline and maintain that position statically but once initiates movement in standing has strong R anterior lean with L trunk/pelvis rotation and R knee flexion requiring heavy mod assist to maintain upright. Stand>sit with targets on mat for pt to reach back with L hand to promote L weight shift and target for L hip to land on - demos good ability to keep midline and return to sitting with heavy min assist using these targets. In standing, provided mirror feedback with a line drawn down middle of mirror with cues for pt to keep line in middle of his body - pt demos improved motor planning to initiate L weight shift but continues to have that L trunk/pelvis rotation with R knee flexed therefore had pt perform a reaching task with best result found when pt reaching L UE across body to a high target on the R  resulting in R trunk/pelvis rotation, R knee extension, and decreased R lateral lean. R squat pivot EOM>w/c with min/mod assist for lifting/pivoting hips and balance due to R lean. Sitting in w/c>tall kneeling on mat table with +2 min assist for balance using B UE support on bench then up to mod assist for trunk balance in tall kneeling - pt started with knees in wide BOS where he had improved balance but had pt transition to hip width stance. In tall kneeling worked on midline orientation without R anterior lean using external targets and visual feedback via line on mirror - provided target at L anterior hip to promote pelvic rotation to midline and external target at L shoulder for improved midline trunk posture - pt able to achieve midline and maintain ~20seconds with CGA until progressive worsening of R lateral lean. Tall kneeling>prone on mat with min assist for balance during prone stretch. Prone>sitting on mat with min assist for trunk upright. R squat pivot min assist to w/c and transported back to room. Pt left seated in w/c with needs in reach and chair alarm on.   Therapy Documentation Precautions:  Precautions Precautions: Fall, Other (comment) Precaution Comments: R hemiparesis with strong R lean Restrictions Weight Bearing Restrictions: No  Pain: No reports of pain throughout session.  Therapy/Group: Individual Therapy  Ginny Forth , PT, DPT, CSRS  05/05/2020, 6:09 PM

## 2020-05-05 NOTE — Progress Notes (Signed)
Physical Therapy Session Note  Patient Details  Name: Tyler Glass MRN: [id number] Date of Birth: [birth year]  Today's Date: 05/05/2020 PT Individual Time: 0915-1015 PT Individual Time Calculation (min): 60 min   Short Term Goals: Week 1:  PT Short Term Goal 1 (Week 1): Pt will consistently perform sit<>stand transfers with min assist PT Short Term Goal 2 (Week 1): Pt will consistently perform stand pivot transfers with min assist using LRAD PT Short Term Goal 3 (Week 1): Pt will ambulate at least 73ft using LRAD with mod assist PT Short Term Goal 4 (Week 1): Pt will initaite stair training  Skilled Therapeutic Interventions/Progress Updates:    Pt received supine in bed, agreeable to PT session, no reports of pain. Donned TED"s with totalA for time management. Supine<>Sit with CGA with HOB Flat, use of bed rail. Able to sit EOB with supervision and performed squat<>pivot transfer with CGA from EOB to w/c, cues for safety and sequencing. Pt requesting to brush his teeth, therefore, wheeled sinkside where he performed oral care with setupA while seated in chair. Transported to dayroom gym with totalA for time management. Squat<>pivot with CGA from w/c to mat table. Placed mirror in front of him for visual feedback 2/2 mild R lateral lean in sitting. Able to correct with min cues. Performed sit<>stand with min/modA from mat table and initially required mod/maxA for standing balance 2/2 STRONG R lateral lean, difficult correcting. Performed several bouts of static standing with HHA in front of mirror to focus on midline orientation; he was able to maintain standing in midline for ~2 minutes prior to onset of fatigue on resultant heavy R lateral lean. Then performed pre-gait training with targeted stepping with HHA and modA, focusing on lateral weight shifts and motor planning. Squat<>pivot with minA back to w/c and wheeled to main hallway to focus remainder of session on gait training. He ambulated  2x25ft with modA and L HR support with close chair follow for safety, again using mirror for visual feedback. Gait deficits include R lateral lean, step-to gait pattern, decreased B step length, narrow BOS. Multi-modal cues throughout for normalizing gait pattern and improving midline orientation. Returned to his room where he remained seated in chair with chair alarm on, needs in reach.   Therapy Documentation Precautions:  Precautions Precautions: Fall, Other (comment) Precaution Comments: R hemiparesis with strong R lean Restrictions Weight Bearing Restrictions: No  Therapy/Group: Individual Therapy  Joanthan Hlavacek P [name] PT 05/05/2020, 10:14 AM

## 2020-05-05 NOTE — Progress Notes (Signed)
Speech Language Pathology Daily Session Note  Patient Details  Name: Tyler Glass MRN: [id number] Date of Birth: 1934/03/07  Today's Date: 05/05/2020 SLP Individual Time: 1330-1415 SLP Individual Time Calculation (min): 45 min  Short Term Goals: Week 1: SLP Short Term Goal 1 (Week 1): Pt will consume dys 3 textures and nectar thick liquids with mod I use of swallowing precautions and minimal overt s/s of aspiration. SLP Short Term Goal 2 (Week 1): Pt will consume therapeutic trials of thin liquids with mod I use of swallowing precautions and no s/s of respiratory decompensation over 3 consecutive sessions. SLP Short Term Goal 3 (Week 1): Pt will utilize external aids to facilitate recall of daily information with min assist multimodal cues. SLP Short Term Goal 4 (Week 1): Pt will complete mildly complex functional tasks with min assist multimodal cues for functional problem solving. SLP Short Term Goal 5 (Week 1): Pt will utilize increased vocal intensity and overarticulation with supervision to achieve intelligibility at the conversational level  Skilled Therapeutic Interventions:Skilled ST services focused on swallow skills. SLP planned to administer thin liquid trials, however nursing was providing medication. Pt required min A verbal cues to recall pharyngeal exercise, CTAR. Pt admitted he did not complete requested repetitions yestereday due to lack of understanding, if exercises were to be done in or outside of therapy. SLP focused on carryover of CTAR and instructed pt in novel exercise masako. Pt completed x15 CTAR with mod A fading to min A verbal cues and x1 masako. SLP created written instructions to remain pt how to complete exercises outside of treatment time. Pt stated understanding. Pt also demonstrated mod I use of swallow strategy when consuming nectar thick liquids and congestive vocal quality/intermittment throat clearing was only occasionally noted in this session verse more  prominently in yesterday's session. Pt was left in room with call bell within reach and chair alarm set. SLP recommends to continue skilled services.     Pain Pain Assessment Pain Scale: 0-10 Pain Score: 0-No pain  Therapy/Group: Individual Therapy  Tyler Glass  [facility] 05/05/2020, 11:37 AM

## 2020-05-05 NOTE — Progress Notes (Signed)
Occupational Therapy Session Note  Patient Details  Name: Tyler Glass MRN: [id number] Date of Birth: [birth year]  Today's Date: 05/05/2020 OT Individual Time: 3235-5732      Short Term Goals: Week 1:  OT Short Term Goal 1 (Week 1): Pt will complete toilet transfer with min assist OT Short Term Goal 2 (Week 1): Pt will complete LB dressing with min assist OT Short Term Goal 3 (Week 1): Pt will complete LB bathing with min assist OT Short Term Goal 4 (Week 1): Pt will consistently maintain trunk to midline sitting unsupported with CGA and min VCs.  Skilled Therapeutic Interventions/Progress Updates:    Pt sitting up in w/c no c/o pain.  Pt self propelled to sinkside to complete self care.  Pt bathed and dressed UB with setup sitting in w/c.  Pt completed LB dressing and bathing with min assist for standing balance due to right sided leaning while pulling pants and brief up/down over hips and while washing buttocks and peri region .  Pt able to correct during static standing with verbal cues and using mirror for visual feeback, however unable to maintain without min physical assist during dynamic standing.  Pt completed blocked practice sit<>stands at sink to w/c with multimodal cues needed to sit midline due to right leaning tendency. Pt improved from mod A to min A needed by the last sit<>stand.  Pt with call bell in reach, seat alarm on.  Therapy Documentation Precautions:  Precautions Precautions: Fall, Other (comment) Precaution Comments: R hemiparesis with strong R lean Restrictions Weight Bearing Restrictions: No   Therapy/Group: Individual Therapy  Amie Critchley 05/05/2020, 11:02 AM

## 2020-05-05 NOTE — Progress Notes (Signed)
[place] PHYSICAL MEDICINE & REHABILITATION PROGRESS NOTE   Subjective/Complaints: Patient seen sitting up in bed this morning.  He states he slept well overnight.  He states his left lower extremity soreness.  ROS: + Left lower extremity sore.  Denies CP, SOB, N/V/D  Objective:   No results found. Recent Labs    05/04/20 0841  WBC 6.7  HGB 12.3*  HCT 38.1*  PLT 299   Recent Labs    05/04/20 0841  NA 142  K 3.4*  CL 105  CO2 29  GLUCOSE 114*  BUN 9  CREATININE 1.22  CALCIUM 9.2    Intake/Output Summary (Last 24 hours) at 05/05/2020 0853 Last data filed at 05/05/2020 0804 Gross per 24 hour  Intake 660 ml  Output 1875 ml  Net -1215 ml        Physical Exam: Vital Signs Blood pressure 128/82, pulse 89, temperature 97.9 F (36.6 C), temperature source Oral, resp. rate 16, height 6\' 1"  (1.854 m), weight 76 kg, SpO2 98 %. Constitutional: No distress . Vital signs reviewed. HENT: Normocephalic.  Atraumatic. Eyes: EOMI. No discharge. Cardiovascular: No JVD.  RRR. Respiratory: Normal effort.  No stridor.  Bilateral clear to auscultation. GI: Non-distended.  BS +. Skin: Warm and dry.  Intact. Psych: Normal mood.  Normal behavior. Musc: No edema in extremities.  No tenderness in extremities. Neuro: Alert Moderate dysarthria, stable  Motor: 5/5 throughout, stable mild facial weakness   Assessment/Plan: 1. Functional deficits which require 3+ hours per day of interdisciplinary therapy in a comprehensive inpatient rehab setting.  Physiatrist is providing close team supervision and 24 hour management of active medical problems listed below.  Physiatrist and rehab team continue to assess barriers to discharge/monitor patient progress toward functional and medical goals  Care Tool:  Bathing    Body parts bathed by patient: Right arm, Left arm, Chest, Abdomen, Front perineal area, Right upper leg, Left upper leg, Right lower leg, Left lower leg, Face   Body  parts bathed by helper: Buttocks     Bathing assist Assist Level: Moderate Assistance - Patient 50 - 74% (for standing balance)     Upper Body Dressing/Undressing Upper body dressing   What is the patient wearing?: Pull over shirt    Upper body assist Assist Level: Minimal Assistance - Patient > 75%    Lower Body Dressing/Undressing Lower body dressing      What is the patient wearing?: Incontinence brief, Pants     Lower body assist Assist for lower body dressing: Moderate Assistance - Patient 50 - 74%     Toileting Toileting    Toileting assist Assist for toileting: Set up assist Assistive Device Comment: urinal   Transfers Chair/bed transfer  Transfers assist     Chair/bed transfer assist level: Moderate Assistance - Patient 50 - 74%     Locomotion Ambulation   Ambulation assist      Assist level: Maximal Assistance - Patient 25 - 49% Assistive device: No Device Max distance: 32ft   Walk 10 feet activity   Assist  Walk 10 feet activity did not occur: N/A  Assist level: Maximal Assistance - Patient 25 - 49% Assistive device: No Device   Walk 50 feet activity   Assist Walk 50 feet with 2 turns activity did not occur: Safety/medical concerns         Walk 150 feet activity   Assist Walk 150 feet activity did not occur: N/A         Walk  10 feet on uneven surface  activity   Assist Walk 10 feet on uneven surfaces activity did not occur: N/A         Wheelchair     Assist Will patient use wheelchair at discharge?:  (TBD)             Wheelchair 50 feet with 2 turns activity    Assist        Assist Level: Total Assistance - Patient < 25%   Wheelchair 150 feet activity     Assist          Blood pressure 128/82, pulse 89, temperature 97.9 F (36.6 C), temperature source Oral, resp. rate 16, height 6\' 1"  (1.854 m), weight 76 kg, SpO2 98 %.   Medical Problem List and Plan: 1.  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  Antithrombotics: -DVT/anticoagulation:  Lovenox             -antiplatelet therapy: Aspirin 81 mg daily, Plavix 75 mg daily x3 weeks and aspirin alone 3. Pain Management: Tylenol as needed 4. Mood: Provide emotional support             -antipsychotic agents: N/A 5. Neuropsych: This patient is not fully capable of making decisions on his own behalf. 6. Skin/Wound Care: Routine skin checks 7. Fluids/Electrolytes/Nutrition: Routine in and outs 8.    Post stroke dysphagia.  Dysphagia #3 nectar liquids.             Advance diet as tolerated 9. Essential hypertension.  Patient on Norvasc 2.5 mg daily and Zestoretic 10-12.5 mg daily prior to admission.  Resume as needed  Controlled on 11/16  Monitor with increased mobility 10.  Hyperlipidemia: Lipitor 11. Hiccups-   Continue baclofen 10 mg TID, plan to wean tomorrow 12. Constipation  Improving 13.  Hypokalemia  Potassium 3.4 on 11/15, plan to repeat labs later this week  Supplemented x1 day 14.  Transaminitis  LFTs elevated on 11/15, will repeat labs later this week 15.  Hypoalbuminemia  Supplement initiated on 11/15 16.  AKI versus CKD  GFR 58 on 11/15, will repeat labs later this week  Continue to monitor 17.  Acute blood loss anemia  Hemoglobin 12.3 on 11/15  Continue to monitor  LOS: 4 days A FACE TO FACE EVALUATION WAS PERFORMED  Tyler Glass 12/15 05/05/2020, 8:53 AM

## 2020-05-06 ENCOUNTER — Inpatient Hospital Stay (HOSPITAL_COMMUNITY): Payer: Medicare HMO

## 2020-05-06 ENCOUNTER — Inpatient Hospital Stay (HOSPITAL_COMMUNITY): Payer: Medicare HMO | Admitting: Occupational Therapy

## 2020-05-06 ENCOUNTER — Encounter (HOSPITAL_COMMUNITY): Payer: Medicare HMO | Admitting: Psychology

## 2020-05-06 MED ORDER — BACLOFEN 5 MG HALF TABLET
5.0000 mg | ORAL_TABLET | Freq: Three times a day (TID) | ORAL | Status: DC
  Administered 2020-05-06 – 2020-05-07 (×3): 5 mg via ORAL
  Filled 2020-05-06 (×3): qty 1

## 2020-05-06 MED ORDER — MUSCLE RUB 10-15 % EX CREA
TOPICAL_CREAM | Freq: Three times a day (TID) | CUTANEOUS | Status: DC
  Filled 2020-05-06: qty 85

## 2020-05-06 NOTE — Progress Notes (Signed)
Patient ID: Tyler Glass, male   DOB: 02/17/1934, 86 y.o.   MRN: [id number]  Met wit pt and spoke with daughter-Aliya via telephone to inform team conference goals CGA level and target discharge date 12/3. 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 She will look into making arrangements for this. Encouraged her to come in and see pt in therapies to see how he is now and what progress he can make during his stay. Both in agreement with the plan.

## 2020-05-06 NOTE — Patient Care Conference (Signed)
Inpatient RehabilitationTeam Conference and Plan of Care Update Date: 05/06/2020   Time: 11:05 AM    Patient Name: Tyler Glass      Medical Record Number: [id number]  Date of Birth: 1934/01/04 Sex: Male         Room/Bed: 4M07C/4M07C-01 Payor Info: Payor: [employer] MEDICARE / Plan: [employer] MEDICARE HMO / Product Type: *No Product type* /    Admit Date/Time:  05/01/2020  5:09 PM  Primary Diagnosis:  CVA (cerebral vascular accident) [facility])  Hospital Problems: Principal Problem:   CVA (cerebral vascular accident) (HCC) Active Problems:   Acute blood loss anemia   Hypoalbuminemia due to protein-calorie malnutrition (HCC)   Hypokalemia   Essential hypertension   Transaminitis   Hiccups    Expected Discharge Date: Expected Discharge Date: 05/22/20  Team Members Present: Physician leading conference: Dr. Maryla Morrow Care Coodinator Present: Chana Bode, RN, BSN, CRRN;Becky Dupree, LCSW Nurse Present: Despina Hidden, RN PT Present: Wynelle Link, PT OT Present: Dolphus Jenny, OT SLP Present: Colin Benton, SLP PPS Coordinator present : Fae Pippin, L[place]le [place], PT     Current Status/Progress Goal Weekly Team Focus  Bowel/Bladder   Continent B/B LBM 05/05/2020  maintain continence  assess and toilet q2h   Swallow/Nutrition/ Hydration   nectar thick liquids and dys 3 textures Supervision-Mod I  Mod I  trial of thin liquids, pharyngeal exercises, repeat MBS planned for end of the week   ADL's   min/modA functional transfers, supervision UB ADLs, mod/maxA LB ADLs  CGA  standing balance, functional transfer training, self care training.   Mobility   minA bed mobility, minA squat<>pivot transfers, gait maxA 87ft with hand rail. STRONG R lean/push  CGA  midline orientation, pre-gait training, functional transfers, standing balance   Communication   Min-Supervision A conversation  Mod I  carryover of spech intelligibility startegies, increase vocal intensity    Safety/Cognition/ Behavioral Observations  Mod A  Supervision A  mildly complex problem solving and recall   Pain   denies pain         Skin   skin intact           Discharge Planning:  Home with daughter and her family-will need to confirm 24 hr care at discharge. Daughter does have her own business   Team Discussion: Labs elevated, MD monitoring LFTs, etc. MD decreased baclofen for hiccups however patient noted increased back pain; new order for muscle rub to back. Patient is motivated and making improvements however progress impaired by right lean and pushing. Patient is unable to correct lean during dynamic standing exercises and has trouble with midline awareness.  Patient on target to meet rehab goals: yes  *See Care Plan and progress notes for long and short-term goals.   Revisions to Treatment Plan:  Staff working on pharyngeal exercises, complex problem solving and recall Teaching Needs: Transfers, toileting, medications, etc.  Current Barriers to Discharge: Decreased caregiver support  Possible Resolutions to Barriers: Home with daughter; SW to confirm 24/7 care availability     Medical Summary Current Status: Dysarthria/dysphagia secondary to right lateral medullary infarct  Barriers to Discharge: Medical stability;Other (comments)  Barriers to Discharge Comments: LLE pain, post stroke dysphagia Possible Resolutions to Barriers/Weekly Focus: Therapies, follow labs - Cr, LFTs, K+, follow BPs, optimize pain meds, plan for [facility] Friday   Continued Need for Acute Rehabilitation Level of Care: The patient requires daily medical management by a physician with specialized training in physical medicine and rehabilitation for the following reasons: Direction  of a multidisciplinary physical rehabilitation program to maximize functional independence : Yes Medical management of patient stability for increased activity during participation in an intensive rehabilitation regime.:  Yes Analysis of laboratory values and/or radiology reports with any subsequent need for medication adjustment and/or medical intervention. : Yes   I attest that I was present, lead the team conference, and concur with the assessment and plan of the team.   Chana Bode B 05/06/2020, 1:50 PM

## 2020-05-06 NOTE — Progress Notes (Signed)
[place] PHYSICAL MEDICINE & REHABILITATION PROGRESS NOTE   Subjective/Complaints: Patient seen sitting up in bed this morning.  He states he slept well overnight.  He states his left thigh is sore.  ROS: + Left thigh soreness.  Denies CP, SOB, N/V/D  Objective:   No results found. Recent Labs    05/04/20 0841  WBC 6.7  HGB 12.3*  HCT 38.1*  PLT 299   Recent Labs    05/04/20 0841  NA 142  K 3.4*  CL 105  CO2 29  GLUCOSE 114*  BUN 9  CREATININE 1.22  CALCIUM 9.2    Intake/Output Summary (Last 24 hours) at 05/06/2020 6195 Last data filed at 05/06/2020 0648 Gross per 24 hour  Intake 440 ml  Output 1325 ml  Net -885 ml        Physical Exam: Vital Signs Blood pressure 129/87, pulse 90, temperature 98.5 F (36.9 C), temperature source Oral, resp. rate 16, height 6\' 1"  (1.854 m), weight 76 kg, SpO2 99 %. Constitutional: No distress . Vital signs reviewed. HENT: Normocephalic.  Atraumatic. Eyes: EOMI. No discharge. Cardiovascular: No JVD.  RRR. Respiratory: Normal effort.  No stridor.  Bilateral clear to auscultation. GI: Non-distended.  BS +. Skin: Warm and dry.  Intact. Psych: Normal mood.  Normal behavior. Musc: No edema in extremities.  Left thigh mild tenderness to palpation. Neuro: Alert Moderate dysarthria, stable  Motor: 5/5 throughout, left proximal lower extremity somewhat limited by pain Mild facial weakness   Assessment/Plan: 1. Functional deficits which require 3+ hours per day of interdisciplinary therapy in a comprehensive inpatient rehab setting.  Physiatrist is providing close team supervision and 24 hour management of active medical problems listed below.  Physiatrist and rehab team continue to assess barriers to discharge/monitor patient progress toward functional and medical goals  Care Tool:  Bathing    Body parts bathed by patient: Right arm, Left arm, Chest, Abdomen, Front perineal area, Right upper leg, Left upper leg, Face,  Buttocks   Body parts bathed by helper: Buttocks Body parts n/a: Left lower leg, Right lower leg   Bathing assist Assist Level: Minimal Assistance - Patient > 75%     Upper Body Dressing/Undressing Upper body dressing   What is the patient wearing?: Pull over shirt    Upper body assist Assist Level: Set up assist    Lower Body Dressing/Undressing Lower body dressing      What is the patient wearing?: Incontinence brief, Pants     Lower body assist Assist for lower body dressing: Minimal Assistance - Patient > 75%     Toileting Toileting    Toileting assist Assist for toileting: Set up assist Assistive Device Comment: urinal   Transfers Chair/bed transfer  Transfers assist     Chair/bed transfer assist level: Moderate Assistance - Patient 50 - 74%     Locomotion Ambulation   Ambulation assist      Assist level: Maximal Assistance - Patient 25 - 49% Assistive device: No Device Max distance: 83ft   Walk 10 feet activity   Assist  Walk 10 feet activity did not occur: N/A  Assist level: Maximal Assistance - Patient 25 - 49% Assistive device: No Device   Walk 50 feet activity   Assist Walk 50 feet with 2 turns activity did not occur: Safety/medical concerns         Walk 150 feet activity   Assist Walk 150 feet activity did not occur: N/A  Walk 10 feet on uneven surface  activity   Assist Walk 10 feet on uneven surfaces activity did not occur: N/A         Wheelchair     Assist Will patient use wheelchair at discharge?:  (TBD)             Wheelchair 50 feet with 2 turns activity    Assist        Assist Level: Total Assistance - Patient < 25%   Wheelchair 150 feet activity     Assist          Blood pressure 129/87, pulse 90, temperature 98.5 F (36.9 C), temperature source Oral, resp. rate 16, height 6\' 1"  (1.854 m), weight 76 kg, SpO2 99 %.   Medical Problem List and Plan: 1.   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 Please see conference note from today as well.  2.  Antithrombotics: -DVT/anticoagulation:  Lovenox             -antiplatelet therapy: Aspirin 81 mg daily, Plavix 75 mg daily x3 weeks and aspirin alone 3. Pain Management: Tylenol as needed  Muscle rub for left lateral ordered on 11/17 4. Mood: Provide emotional support             -antipsychotic agents: N/A 5. Neuropsych: This patient is not fully capable of making decisions on his own behalf. 6. Skin/Wound Care: Routine skin checks 7. Fluids/Electrolytes/Nutrition: Routine in and outs 8.    Post stroke dysphagia.  Dysphagia #3 nectar liquids.             Advance diet as tolerated 9. Essential hypertension.  Patient on Norvasc 2.5 mg daily and Zestoretic 10-12.5 mg daily prior to admission.  Resume as needed  Controlled on 11/17  Monitor with increased mobility 10.  Hyperlipidemia: Lipitor 11. Hiccups-   Continue baclofen 10 mg TID, decreased to 5 3 times daily on 11/17 12. Constipation  Improving 13.  Hypokalemia  Potassium 3.4 on 11/15, plan to repeat labs at the end of the week  Supplemented x1 day 14.  Transaminitis  LFTs elevated on 11/15, will repeat labs at the end of the week 15.  Hypoalbuminemia  Supplement initiated on 11/15 16.  AKI versus CKD  GFR 58 on 11/15, will repeat labs in the end of the week  Continue to monitor 17.  Acute blood loss anemia  Hemoglobin 12.3 on 11/15  Continue to monitor  LOS: 5 days A FACE TO FACE EVALUATION WAS PERFORMED  Morgane Joerger 12/15 05/06/2020, 9:22 AM

## 2020-05-06 NOTE — Consult Note (Signed)
Neuropsychological Consultation   Patient:   Tyler Glass   DOB:   [birth year]  MR Number:  [id number]  Location:  [facility] [address] CENTER [address] [id number] [place] [address] Dept: [phone] Loc: [phone]           Date of Service:   05/06/2020  Start Time:   3 PM End Time:   4 PM  Provider/Observer:  Arley Phenix, Psy.D.       Clinical Neuropsychologist       Billing Code/Service: 90240  Chief Complaint:    Tyler Glass is an 86 year old male with history of hypertension.  Patient has been living independently and driving and doing his own shopping and yard work etc. prior to presentation.  The patient presented on 04/28/2020 to outside hospital with right hemiparesis and dysarthria.  Head CT was unremarkable for acute intracranial process.  Patient did not receive TPA.  MRI of brain showed right lateral medullary infarction along with mild chronic small vessel ischemic changes in the pons and cerebral hemisphere deep white matter.  General age-related atrophy.  Patient is continued with motor deficits and difficulty with body lean.    Reason for Service:  Patient was referred for neuropsychological consultation due to coping and adjustment issues following CVA.  Below is the HPI for the current admission.  HPI: Tyler Glass is an 86 year old right-handed male with history of hypertension.  History taken from chart review and patient due to dysarthria and cognition.  Patient lives in [place], but plans to live with his daughter, who works, at discharge.  Two-level home half bath on main level.  Reportedly independent prior to admission.  Up until recently patient been living in [place] alone driving doing his own grocery shopping and yard work.  He has a daughter here in the area that works.  He presented on 04/28/2020 with right hemiparesis and dysarthria.  Head CT unremarkable for  acute intracranial process.  Patient did not receive TPA.  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  MRA angiography does not show significant carotid bifurcation disease.  Admission chemistries glucose 172, creatinine 1.44, alcohol negative urine drug screen negative, hemoglobin A1c of 6.  Echocardiogram with ejection fraction of 60-65%, no wall motion abnormalities grade 1 diastolic dysfunction.  Currently maintained on aspirin and Plavix for CVA prophylaxis x3 weeks and aspirin alone.  Subcutaneous Lovenox for DVT prophylaxis.  Hospital course further complicated by postop dysphagia, currently on a dysphagia #3 nectar thick liquid diet.  Therapy evaluations completed and patient was admitted for a comprehensive rehab program.  Please see preadmission assessment from earlier today as well.  Current Status:  Upon entering the room the patient was laying in bed sound asleep.  Initially he was difficult to awaken.  The patient was also somewhat disoriented upon initial awakening but oriented rather quickly.  Patient's mental status/cognition appear to be within normal limits and he was aware of recent medical events and had good recall for for both recent and remote information.  The patient was also aware about plan going forward where he will initially go to his daughter's house after discharge in hopes to eventually return home to [place].  The patient has not been living with his wife for some time but the wife has agreed to move back to their house to help care for the patient.  The patient reports that his  mood is stable and denies any significant depressive or anxiety based symptomatology.  The patient is frustrated with his inability to ambulate like he had been and is somewhat concerned about falling but otherwise is adjusting okay to motor functioning deficits.  Initially, the patient had recurrent and frequent hiccups following a stroke  that lasted for some time but reports that they have now stopped.  He reports that this was quite a relief for him and these hiccups were very stressful when they were occurring.  Behavioral Observation: Tyler Glass  presents as a 86 y.o.-year-old Right African American Male who appeared his stated age. his dress was Appropriate and he was Well Groomed and his manners were Appropriate to the situation.  his participation was indicative of Appropriate and Redirectable behaviors.  There were any physical disabilities noted.  he displayed an appropriate level of cooperation and motivation.     Interactions:    Active Appropriate and Redirectable  Attention:   abnormal and attention span appeared shorter than expected for age  Memory:   within normal limits; recent and remote memory intact  Visuo-spatial:  not examined  Speech (Volume):  normal  Speech:   normal; normal  Thought Process:  Coherent and Relevant  Though Content:  WNL; not suicidal and not homicidal  Orientation:   person, place, time/date and situation  Judgment:   Fair  Planning:   Fair  Affect:    Appropriate  Mood:    Euthymic  Insight:   Good  Intelligence:   normal  Medical History:   Past Medical History:  Diagnosis Date  . Hypertension     Psychiatric History:  No prior psychiatric history.  Family Med/Psych History: History reviewed. No pertinent family history.  Impression/DX:  Tyler Glass is an 86 year old male with history of hypertension.  Patient has been living independently and driving and doing his own shopping and yard work etc. prior to presentation.  The patient presented on 04/28/2020 to outside hospital with right hemiparesis and dysarthria.  Head CT was unremarkable for acute intracranial process.  Patient did not receive TPA.  MRI of brain showed right lateral medullary infarction along with mild chronic small vessel ischemic changes in the pons and cerebral hemisphere deep white matter.   General age-related atrophy.  Patient is continued with motor deficits and difficulty with body lean.   Upon entering the room the patient was laying in bed sound asleep.  Initially he was difficult to awaken.  The patient was also somewhat disoriented upon initial awakening but oriented rather quickly.  Patient's mental status/cognition appear to be within normal limits and he was aware of recent medical events and had good recall for for both recent and remote information.  The patient was also aware about plan going forward where he will initially go to his daughter's house after discharge in hopes to eventually return home to [place].  The patient has not been living with his wife for some time but the wife has agreed to move back to their house to help care for the patient.  The patient reports that his mood is stable and denies any significant depressive or anxiety based symptomatology.  The patient is frustrated with his inability to ambulate like he had been and is somewhat concerned about falling but otherwise is adjusting okay to motor functioning deficits.  Initially, the patient had recurrent and frequent hiccups following a stroke that lasted for some time but reports that they have now stopped.  He reports that this was quite a relief for him and these hiccups were very stressful when they were occurring.   Disposition/Plan:  The patient appears to be coping much better with residual effects of his cerebrovascular accident affecting the medulla region.  He has residual motor deficits and initially had excessive hiccups that have ceased recently.  Pain should does have mild small vessel disease both subcortically and in cortical deep white matter regions.  These are not severe.  The patient is having to cope with significant change in his motor functioning in an individual that is 86 years old and had been living alone and managing his ADLs well and maintaining his  house.  Diagnosis:    Cerebrovascular accident (CVA) due to thrombosis of precerebral artery [facility]) - Plan: Ambulatory referral to Neurology         Electronically Signed   _______________________ Arley Phenix, Psy.D.

## 2020-05-06 NOTE — Progress Notes (Signed)
Occupational Therapy Session Note  Patient Details  Name: Tyler Glass MRN: [id number] Date of Birth: 1933/06/23  Today's Date: 05/06/2020 OT Individual Time: 1403-1500 OT Individual Time Calculation (min): 57 min    Short Term Goals: Week 1:  OT Short Term Goal 1 (Week 1): Pt will complete toilet transfer with min assist OT Short Term Goal 2 (Week 1): Pt will complete LB dressing with min assist OT Short Term Goal 3 (Week 1): Pt will complete LB bathing with min assist OT Short Term Goal 4 (Week 1): Pt will consistently maintain trunk to midline sitting unsupported with CGA and min VCs.  Skilled Therapeutic Interventions/Progress Updates:    Pt sitting up in w/c, no c/o pain.  Pt self propelled to sinkside with supervision and completed self care.  Pt required setup for UB bathing and dressing in seated position.  Min assist to complete sit<>stand and standing balance due to right sided pushing while pulling pants and brief over hips and bathing peri area and buttocks.  Pt able to thread brief and pants to doff/donn with supervision.  Pt doffed TED hose with min assist and donned with setup.  Pt doffed/donned socks with setup.  Pt then participated in blocked practice stand pivot and squat pivot transfers w/c<>BSC and BSC<>EOB.  Pt required CGA for squat pivot and minA to modA using RW for stand pivots due to right sided pushing.  Sit to supine completed with supervision.  Call bell in reach, bed alarm on.    Therapy Documentation Precautions:  Precautions Precautions: Fall, Other (comment) Precaution Comments: R hemiparesis with strong R lean Restrictions Weight Bearing Restrictions: No   Therapy/Group: Individual Therapy  Amie Critchley 05/06/2020, 4:33 PM

## 2020-05-06 NOTE — Progress Notes (Signed)
Physical Therapy Session Note  Patient Details  Name: Tyler Glass MRN: [id number] Date of Birth: 30-Apr-1934  Today's Date: 05/06/2020 PT Individual Time: 1035-1100 PT Individual Time Calculation (min): 25 min   Short Term Goals: Week 1:  PT Short Term Goal 1 (Week 1): Pt will consistently perform sit<>stand transfers with min assist PT Short Term Goal 2 (Week 1): Pt will consistently perform stand pivot transfers with min assist using LRAD PT Short Term Goal 3 (Week 1): Pt will ambulate at least 61f using LRAD with mod assist PT Short Term Goal 4 (Week 1): Pt will initaite stair training  Skilled Therapeutic Interventions/Progress Updates: Pt presented in w/c agreeable to therapy. Pt denies pain during session. Session focused on standing balance and working on midline. Pt transported to rehab gym and performed squat pivot transfer with modA. Pt then performed standing without AD with heavy R anterior lean noted. Due to significant lean pt returned to sitting and performed stand with R hand on L knee with improved orientation. In standing pt used mirror feedback and PTA performing facilitation at hips to perform wt shifting to L. Pt then performed high reaching to L to grasp clothespins with pt demonstrating fair elongation of trunk and fair demonstrating of midline. Once task completed performed squat pivot to w/c minA and transported back to room. Pt left in w/c at end of session with seat alarm on, call bell within reach and needs met.      Therapy Documentation Precautions:  Precautions Precautions: Fall, Other (comment) Precaution Comments: R hemiparesis with strong R lean Restrictions Weight Bearing Restrictions: No General:   Vital Signs: Therapy Vitals Temp: 97.8 F (36.6 C) Temp Source: Oral Pulse Rate: 97 Resp: 14 BP: 112/65 Patient Position (if appropriate): Sitting Oxygen Therapy SpO2: 99 % O2 Device: Room Air    Therapy/Group: Individual Therapy       , PTA  05/06/2020, 3:53 PM

## 2020-05-06 NOTE — Progress Notes (Signed)
Physical Therapy Session Note  Patient Details  Name: Tyler Glass MRN: [id number] Date of Birth: Jun 07, 1934  Today's Date: 05/06/2020 PT Individual Time: 0900-0955 PT Individual Time Calculation (min): 55 min   Short Term Goals: Week 1:  PT Short Term Goal 1 (Week 1): Pt will consistently perform sit<>stand transfers with min assist PT Short Term Goal 2 (Week 1): Pt will consistently perform stand pivot transfers with min assist using LRAD PT Short Term Goal 3 (Week 1): Pt will ambulate at least 21ft using LRAD with mod assist PT Short Term Goal 4 (Week 1): Pt will initaite stair training  Skilled Therapeutic Interventions/Progress Updates:    Pt received supine in bed, awake and agreeable to PT session. He reports L anterior thigh pain, describes as muscular. Skin c/d/i and non-tender to palpation. He reports he told MD during rounds and that his RN is aware as well. Pt in no apparent distress during session. Pt with saturated brief on arrival. Encouraged him to call for nursing staff when needing assistance for toileting, he reports he doesn't want to bother them but explained to him the importance of pericare and hygiene for skin integrity and sanitary concerns; he voiced understanding. Removed brief with totalA and provided posterior pericare with totalA for time management. Able to roll towards his L side with supervision with use of bed rail and able to maintain sidelying position. He was able to perform frontal pericare with setupA. TotalA for donning new brief for time management. Required modA for donning clean pants for threading BLE's while he lay supine in bed. Supine<>sit with supervision with HOB flat. Doffed dirty shirt and donned clean shirt with supervision while seated EOB, posterior and R lateral lean noted with cues for correcting posture. Squat<>pivot with minA from EOB to w/c. Wheeled to main therapy gym for energy conservation. Squat<>pivot with minA from w/c to mat table and  placed mirror in front of him for visual feedback for midline orientation. Performed sit<>stand with minA and no AD and performed lateral weight shifts in standing and practiced static standing balance with focus on midline awareness. He continues to demo strong R lateral lean with inability to correct in standing. He was able to perform gentle upward reaching with unilateral raises with minA guard, focusing on L weight shift and functional reach. Performed gait training with modA +2 via HHA on level surfaces, ambulating 77ft + 6ft (seated rest breaks). Provided visual cues for straddling line on floor as he demo's significant R lateral lean and B feet towards his L side rather than shldr-width apart, again having no correction with R lean although improvement in feet placement. Performed x6 minutes of Biodex including limits of stability and lateral weight shifts; he was able to complete with minA guard but continued to show R pushing with difficult shifting weight over LLE, benefited more so from visual cueing than tactile cueing while completing this. Returned back to his room in w/c where he remained seated in w/c with chair alarm on, needs in reach.    Therapy Documentation Precautions:  Precautions Precautions: Fall, Other (comment) Precaution Comments: R hemiparesis with strong R lean Restrictions Weight Bearing Restrictions: No  Therapy/Group: Individual Therapy  Tyler Glass PT 05/06/2020, 7:37 AM

## 2020-05-06 NOTE — Progress Notes (Signed)
Speech Language Pathology Daily Session Note  Patient Details  Name: Tyler Glass MRN: [id number] Date of Birth: [birth year]  Today's Date: 05/06/2020 SLP Individual Time: 1300-1359 SLP Individual Time Calculation (min): 59 min  Short Term Goals: Week 1: SLP Short Term Goal 1 (Week 1): Pt will consume dys 3 textures and nectar thick liquids with mod I use of swallowing precautions and minimal overt s/s of aspiration. SLP Short Term Goal 2 (Week 1): Pt will consume therapeutic trials of thin liquids with mod I use of swallowing precautions and no s/s of respiratory decompensation over 3 consecutive sessions. SLP Short Term Goal 3 (Week 1): Pt will utilize external aids to facilitate recall of daily information with min assist multimodal cues. SLP Short Term Goal 4 (Week 1): Pt will complete mildly complex functional tasks with min assist multimodal cues for functional problem solving. SLP Short Term Goal 5 (Week 1): Pt will utilize increased vocal intensity and overarticulation with supervision to achieve intelligibility at the conversational level  Skilled Therapeutic Interventions:Skilled ST services focused on swallow and cognitive skills. SLP facilitated mildly complex problem solving skills in TID pill organizer task. Pt disclosed reading deficits when attempting to read medication list, therefore SLP adjusted task to verbal problem solving and physically filling out organizer. Pt required extensive education pertaining to medication consumed multiple times a day and admitting to taking medication "just when I remembered." SLP provided education pertaining to consistent medication management and wrote down when pt should consume each pill based on prescription. SLP demonstrated filling out medication x1 per day, only then was pt able to return demonstration. SLP stopped task to focus on swallowing goals. Pt expressed forgetting to complete pharyngeal strengthening exercises and SLP provided  education. Pt completed x8 CTAR, but had no recall of how to complete masako exercise even after demonstration. Pt completed oral care with set up assist and consumed 4oz of thin liquids via cup sips with only x1 noted delayed throat clearing. It should be noted that pt did demonstrate increase throat clearing throughout session compared to yesterdays's session as same time following lunch meal. SLP questions carryover of swallow strategies due to memory deficits. Pt was left in room with call bell within reach and chair alarm set. SLP recommends to continue skilled services.     Pain Pain Assessment Pain Score: 0-No pain  Therapy/Group: Individual Therapy  Katerine Morua  [facility] 05/06/2020, 5:28 PM

## 2020-05-07 ENCOUNTER — Inpatient Hospital Stay (HOSPITAL_COMMUNITY): Payer: Medicare HMO

## 2020-05-07 ENCOUNTER — Inpatient Hospital Stay (HOSPITAL_COMMUNITY): Payer: Medicare HMO | Admitting: Speech Pathology

## 2020-05-07 ENCOUNTER — Inpatient Hospital Stay (HOSPITAL_COMMUNITY): Payer: Medicare HMO | Admitting: Occupational Therapy

## 2020-05-07 DIAGNOSIS — N179 Acute kidney failure, unspecified: Secondary | ICD-10-CM

## 2020-05-07 DIAGNOSIS — I639 Cerebral infarction, unspecified: Secondary | ICD-10-CM

## 2020-05-07 NOTE — Progress Notes (Signed)
Speech Language Pathology Daily Session Note  Patient Details  Name: Tyler Glass MRN: [id number] Date of Birth: 09-15-1933  Today's Date: 05/07/2020 SLP Individual Time: 8882-8003 SLP Individual Time Calculation (min): 45 min  Short Term Goals: Week 1: SLP Short Term Goal 1 (Week 1): Pt will consume dys 3 textures and nectar thick liquids with mod I use of swallowing precautions and minimal overt s/s of aspiration. SLP Short Term Goal 2 (Week 1): Pt will consume therapeutic trials of thin liquids with mod I use of swallowing precautions and no s/s of respiratory decompensation over 3 consecutive sessions. SLP Short Term Goal 3 (Week 1): Pt will utilize external aids to facilitate recall of daily information with min assist multimodal cues. SLP Short Term Goal 4 (Week 1): Pt will complete mildly complex functional tasks with min assist multimodal cues for functional problem solving. SLP Short Term Goal 5 (Week 1): Pt will utilize increased vocal intensity and overarticulation with supervision to achieve intelligibility at the conversational level  Skilled Therapeutic Interventions:   Patient seen for skilled ST session focusing on swallow and cognitive goals. Patient able to recall and accurately perform swallow exercise learned in previous ST session, but required moderate cues and demonstration to perform the second exercise. He was able to recall and tell SLP of recent therapies completed in PT/OT/SLP. He performed chin tuck against resistance exercise 15 times and Masako maneuver 5 times. Patient did exhibit mild congestion heard in voice and exhibited intermittent throat clearing in absence of any oral intake during session.  Cognitive treatment focused on recall of recent events, problem solving. Patient called daughter during session and she is already aware of and planning improved consistency with medications at home, as patient has h/o forgetting if he took medications. SLP discussed with  patient and daughter that it seems that medication taking hasn't worked its way into his daily routine yet. Patient oriented to time, place and situation. He continues to benefit from skilled SLP intervention to maximize cognitive function prior to discharge.  Pain Pain Assessment Pain Scale: 0-10 Pain Score: 0-No pain  Therapy/Group: Individual Therapy   Angela Nevin, MA, CCC-SLP Speech Therapy

## 2020-05-07 NOTE — Progress Notes (Signed)
Physical Therapy Session Note  Patient Details  Name: Tyler Glass MRN: [id number] Date of Birth: 1933-11-20  Today's Date: 05/07/2020 PT Individual Time: 1000-1058 - 8309-4076 PT Individual Time Calculation (min): 58 min  + 42 min  Short Term Goals: Week 1:  PT Short Term Goal 1 (Week 1): Pt will consistently perform sit<>stand transfers with min assist PT Short Term Goal 2 (Week 1): Pt will consistently perform stand pivot transfers with min assist using LRAD PT Short Term Goal 3 (Week 1): Pt will ambulate at least 23ft using LRAD with mod assist PT Short Term Goal 4 (Week 1): Pt will initaite stair training  Skilled Therapeutic Interventions/Progress Updates:     1st session: Pt greeted supine in bed, awake and agreeable to PT session without c/o pain. Donned socks with setupA while supine in bed. Supine<>sit with CGA with HOB flat. Performed sit<>Stand with minA from lowered EOB height. In initial standing, noted he to have saturated brief. Returned to supine position with CGA and removed brief with minA. Provided soaped washcloths where he performed pericare with setupA while supine in bed. Provided clean pants which he donned with minA for threading BLE's. Supine<>sit with CGA and squat<>pivot transfer with minA from EOB to w/c. Wheeled to main therapy gym for time management. Performed squat<>pivot to mat table with minA. Placed mirror in front of him for visual feedback to reduce R pushing. Performed several sit<>stands with minA and no AD, requiring modA initially for standing balance 2/2 R pushing. This improved to standing with CGA/minA with cues for L lateral weight shift. While in standing with cones positioned on L side, performed both ipsilateral reaching with LUE and midline reaching with RUE, emphasizing L weight shift to reduce R pushing. He also performed upward reaching with minA guard, focusing on elongation of the trunk and erect posture with combination of L lateral weight  shift/lean. Focused remainder of session on skilled gait training. With +2 HHA assist and min/modA, he ambulated 2x18ft on level surfaces. Gait deficits include narrow BOS, R pushing, scissoring with RLE, decreased R step length. Multi-modal cueing throughout for midline orientation, sequencing, normalizing gait, and widening BOS. Squat<>pivot with CGA from mat table to w/c and returned to his room where he remained seated in chair with chair alarm on, needs in reach.  2nd session: Pt received sitting in w/c, agreeable to PT session, finishing his lunch. W/c transport for time management from his room to main hallway. Performed gait training 2x72ft with modA and HR support with chair follow for safety. Continues to demo strong R pushing during any standing functional tasks with difficulty self correcting, responding best to visual cues as tactile feedback sometimes causes further pushing. He also performed NMR with BITS system, requiring modA for standing balance while performing L lateral reaching to targets. Again difficulty with correcting and maintaining midline. Spent time explaining to him reasoning for R pushing and why therapy is focusing on balance > strength. He appears to understand but later during session asks why we aren't incorporating resistance training into therapy, reminded him of reasoning. Returned to his room where he remained seated in w/c at end of session with chair alarm on.   Therapy Documentation Precautions:  Precautions Precautions: Fall, Other (comment) Precaution Comments: R hemiparesis with strong R lean Restrictions Weight Bearing Restrictions: No  Therapy/Group: Individual Therapy  Sajad Glander P Haddy Mullinax PT 05/07/2020, 7:39 AM

## 2020-05-07 NOTE — Progress Notes (Signed)
[place] PHYSICAL MEDICINE & REHABILITATION PROGRESS NOTE   Subjective/Complaints: Patient seen laying in bed this Am.  He states he did not sleep well overnight due to left leg pain.  He states it hurts, however, when asked to activate and range, he denies pain.    ROS: Denies CP, SOB, N/V/D  Objective:   No results found. No results for input(s): WBC, HGB, HCT, PLT in the last 72 hours. No results for input(s): NA, K, CL, CO2, GLUCOSE, BUN, CREATININE, CALCIUM in the last 72 hours.  Intake/Output Summary (Last 24 hours) at 05/07/2020 1145 Last data filed at 05/07/2020 0515 Gross per 24 hour  Intake 480 ml  Output 850 ml  Net -370 ml        Physical Exam: Vital Signs Blood pressure 102/67, pulse 97, temperature 98.7 F (37.1 C), temperature source Oral, resp. rate 18, height 6\' 1"  (1.854 m), weight 76 kg, SpO2 97 %.  Constitutional: No distress . Vital signs reviewed. HENT: Normocephalic.  Atraumatic. Eyes: EOMI. No discharge. Cardiovascular: No JVD.  RRR. Respiratory: Normal effort.  No stridor.  Bilateral clear to auscultation. GI: Non-distended.  BS +. Skin: Warm and dry.  Intact. Psych: Normal mood.  Normal behavior. Musc: No edema in extremities.  No tenderness in extremities. Neuro: Alert Moderate dysarthria, improved Motor: 5/5 throughout Mild facial weakness   Assessment/Plan: 1. Functional deficits which require 3+ hours per day of interdisciplinary therapy in a comprehensive inpatient rehab setting.  Physiatrist is providing close team supervision and 24 hour management of active medical problems listed below.  Physiatrist and rehab team continue to assess barriers to discharge/monitor patient progress toward functional and medical goals  Care Tool:  Bathing    Body parts bathed by patient: Right arm, Left arm, Chest, Abdomen, Front perineal area, Right upper leg, Left upper leg, Face, Buttocks   Body parts bathed by helper: Buttocks Body parts  n/a: Left lower leg, Right lower leg   Bathing assist Assist Level: Minimal Assistance - Patient > 75%     Upper Body Dressing/Undressing Upper body dressing   What is the patient wearing?: Pull over shirt    Upper body assist Assist Level: Set up assist    Lower Body Dressing/Undressing Lower body dressing      What is the patient wearing?: Incontinence brief, Pants     Lower body assist Assist for lower body dressing: Minimal Assistance - Patient > 75%     Toileting Toileting    Toileting assist Assist for toileting: Set up assist Assistive Device Comment: urinal   Transfers Chair/bed transfer  Transfers assist     Chair/bed transfer assist level: Moderate Assistance - Patient 50 - 74%     Locomotion Ambulation   Ambulation assist      Assist level: Maximal Assistance - Patient 25 - 49% Assistive device: No Device Max distance: 50ft   Walk 10 feet activity   Assist  Walk 10 feet activity did not occur: N/A  Assist level: Maximal Assistance - Patient 25 - 49% Assistive device: No Device   Walk 50 feet activity   Assist Walk 50 feet with 2 turns activity did not occur: Safety/medical concerns         Walk 150 feet activity   Assist Walk 150 feet activity did not occur: N/A         Walk 10 feet on uneven surface  activity   Assist Walk 10 feet on uneven surfaces activity did not occur: N/A  Wheelchair     Assist Will patient use wheelchair at discharge?:  (TBD)             Wheelchair 50 feet with 2 turns activity    Assist        Assist Level: Total Assistance - Patient < 25%   Wheelchair 150 feet activity     Assist          Medical Problem List and Plan: 1.  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  Antithrombotics: -DVT/anticoagulation:  Lovenox  Doppler ordered for LLE             -antiplatelet therapy: Aspirin 81 mg daily, Plavix 75 mg  daily x3 weeks and aspirin alone 3. Pain Management: Tylenol as needed  Muscle rub for left lateral ordered on 11/17  Improved on 11/18 4. Mood: Provide emotional support             -antipsychotic agents: N/A 5. Neuropsych: This patient is not fully capable of making decisions on his own behalf. 6. Skin/Wound Care: Routine skin checks 7. Fluids/Electrolytes/Nutrition: Routine in and outs 8.    Post stroke dysphagia.  Dysphagia #3 nectar liquids.             Advance diet as tolerated 9. Essential hypertension.  Patient on Norvasc 2.5 mg daily and Zestoretic 10-12.5 mg daily prior to admission.  Resume as needed  Controlled on 11/18  Monitor with increased mobility 10.  Hyperlipidemia: Lipitor 11. Hiccups-   Continue baclofen 10 mg TID, decreased to 5 3 times daily on 11/17, d/ced on 11/18 12. Constipation  Improving 13.  Hypokalemia  Potassium 3.4 on 11/15, labs ordered for tomorrow  Supplemented x1 day 14.  Transaminitis  LFTs elevated on 11/15, labs ordered for tomorrow 15.  Hypoalbuminemia  Supplement initiated on 11/15 16.  AKI versus CKD  GFR 58 on 11/15, labs ordered for tomorrow  Continue to monitor 17.  Acute blood loss anemia  Hemoglobin 12.3 on 11/15  Continue to monitor  LOS: 6 days A FACE TO FACE EVALUATION WAS PERFORMED  Tyler Glass [name] 05/07/2020, 11:45 AM

## 2020-05-07 NOTE — Progress Notes (Addendum)
Left lower extremity venous duplex completed. Refer to "CV Proc" under chart review to view preliminary results.  Unable to find pager number for Dr. Allena Katz; preliminary results discussed with Delle Reining, PA-C.  05/07/2020 4:10 PM Eula Fried., MHA, RVT, RDCS, RDMS

## 2020-05-07 NOTE — Progress Notes (Addendum)
Discussed patient's doppler results with Dr. Roda Shutters. Patient with posterior tib/peroneal and gastrocnemius DVTs--LLE pain last night.  Could monitor but as patient symptomatic and still with limited mobility--recommends treating. ASA or Plavix not needed with Eliquis on board. After 3-6 months can transition to ASA for stroke prevention.   Patient and daughter updated on results

## 2020-05-07 NOTE — Progress Notes (Signed)
Occupational Therapy Session Note  Patient Details  Name: Tyler Glass Reason MRN: [id number] Date of Birth: 04-14-1934  Today's Date: 05/07/2020 OT Individual Time: 1430-1530 OT Individual Time Calculation (min): 60 min    Short Term Goals: Week 1:  OT Short Term Goal 1 (Week 1): Pt will complete toilet transfer with min assist OT Short Term Goal 2 (Week 1): Pt will complete LB dressing with min assist OT Short Term Goal 3 (Week 1): Pt will complete LB bathing with min assist OT Short Term Goal 4 (Week 1): Pt will consistently maintain trunk to midline sitting unsupported with CGA and min VCs.  Skilled Therapeutic Interventions/Progress Updates:    Pt sitting up in w/c, reports he already washed up with PT, requesting to defer self care today.  Pt transported to large gym for time mgt.  OT session focused on functional transfer training, sitting and standing balance, righting reactions, and neuro re-ed to facilitate increased independence and safety during basic ADL completion.  Pt completed blocked practice stand pivot w/c<> EOM and sit<>stand at [facility] transfers using RW needing min assist to maintain balance and correct right pushing.  Mirror placed in front of pt during sit to stand transfers to promote visual feedback and improve motor learning.  Pt needed re-education on safe hand placement during functional transfers initially, with improved carryover as session continued needing only occasional cues to initiate.  Pt also completed functional reach task in sitting and standing to facilitate left sided leaning and reduce pushing to the right.  Pt having difficulty leaning to left needing max TCs and VCs to complete.  Pt completed unilateral standing at RW with visual feedback from mirror however pt significantly pushing to right and needing mod assist to maintain balance.  Pt returned to w/c and transported back to room, pt requesting back to bed.  Squat pivot with CGA to EOB and sit to supine with  supervision. Pt seems to have emergent awareness at times regarding right sided pushing with improved ability to correct in static standing however awareness impaired during dynamic tasks.  Call bell in reach, bed alarm on.    Therapy Documentation Precautions:  Precautions Precautions: Fall, Other (comment) Precaution Comments: R hemiparesis with strong R lean Restrictions Weight Bearing Restrictions: No   Therapy/Group: Individual Therapy  Amie Critchley 05/07/2020, 4:12 PM

## 2020-05-08 ENCOUNTER — Inpatient Hospital Stay (HOSPITAL_COMMUNITY): Payer: Medicare HMO | Admitting: Speech Pathology

## 2020-05-08 ENCOUNTER — Inpatient Hospital Stay (HOSPITAL_COMMUNITY): Payer: Medicare HMO

## 2020-05-08 ENCOUNTER — Inpatient Hospital Stay (HOSPITAL_COMMUNITY): Payer: Medicare HMO | Admitting: Occupational Therapy

## 2020-05-08 DIAGNOSIS — N183 Chronic kidney disease, stage 3 unspecified: Secondary | ICD-10-CM

## 2020-05-08 DIAGNOSIS — I82442 Acute embolism and thrombosis of left tibial vein: Secondary | ICD-10-CM

## 2020-05-08 LAB — COMPREHENSIVE METABOLIC PANEL
ALT: 62 U/L — ABNORMAL HIGH (ref 0–44)
AST: 40 U/L (ref 15–41)
Albumin: 3 g/dL — ABNORMAL LOW (ref 3.5–5.0)
Alkaline Phosphatase: 65 U/L (ref 38–126)
Anion gap: 8 (ref 5–15)
BUN: 18 mg/dL (ref 8–23)
CO2: 29 mmol/L (ref 22–32)
Calcium: 9 mg/dL (ref 8.9–10.3)
Chloride: 104 mmol/L (ref 98–111)
Creatinine, Ser: 1.33 mg/dL — ABNORMAL HIGH (ref 0.61–1.24)
GFR, Estimated: 53 mL/min — ABNORMAL LOW (ref >60)
Glucose, Bld: 117 mg/dL — ABNORMAL HIGH (ref 70–99)
Potassium: 4 mmol/L (ref 3.5–5.1)
Sodium: 141 mmol/L (ref 135–145)
Total Bilirubin: 0.6 mg/dL (ref 0.3–1.2)
Total Protein: 5.5 g/dL — ABNORMAL LOW (ref 6.5–8.1)

## 2020-05-08 MED ORDER — APIXABAN 5 MG PO TABS
10.0000 mg | ORAL_TABLET | Freq: Two times a day (BID) | ORAL | Status: AC
  Administered 2020-05-08 – 2020-05-14 (×14): 10 mg via ORAL
  Filled 2020-05-08 (×13): qty 2

## 2020-05-08 MED ORDER — APIXABAN 5 MG PO TABS
5.0000 mg | ORAL_TABLET | Freq: Two times a day (BID) | ORAL | Status: DC
  Administered 2020-05-15 – 2020-05-25 (×21): 5 mg via ORAL
  Filled 2020-05-08 (×21): qty 1

## 2020-05-08 NOTE — Discharge Instructions (Addendum)
Inpatient Rehab Discharge Instructions  Tyler Glass Discharge date and time: No discharge date for patient encounter.   Activities/Precautions/ Functional Status: Activity: activity as tolerated Diet:  Wound Care: Routine skin checks Functional status:  ___ No restrictions     ___ Walk up steps independently ___ 24/7 supervision/assistance   ___ Walk up steps with assistance ___ Intermittent supervision/assistance  ___ Bathe/dress independently ___ Walk with walker     _x__ Bathe/dress with assistance ___ Walk Independently    ___ Shower independently ___ Walk with assistance    ___ Shower with assistance ___ No alcohol     ___ Return to work/school ________  Special Instructions: No driving smoking or alcohol    COMMUNITY REFERRALS UPON DISCHARGE:    Home Health:   PT, OT, SP                 Agency:[facility]   Phone: [phone]   Medical Equipment/Items Ordered:ROLLING WALKER & 3 IN 1                                                 Agency/Supplier:[facility]  [phone]   STROKE/TIA DISCHARGE INSTRUCTIONS SMOKING Cigarette smoking nearly doubles your risk of having a stroke & is the single most alterable risk factor  If you smoke or have smoked in the last 12 months, you are advised to quit smoking for your health.  Most of the excess cardiovascular risk related to smoking disappears within a year of stopping.  Ask you doctor about anti-smoking medications   Quit Line: 1-800-QUIT NOW  Free Smoking Cessation Classes [phone]  CHOLESTEROL Know your levels; limit fat & cholesterol in your diet  Lipid Panel     Component Value Date/Time   CHOL 196 04/28/2020 1317   TRIG 101 04/28/2020 1317   HDL 58 04/28/2020 1317   CHOLHDL 3.4 04/28/2020 1317   VLDL 20 04/28/2020 1317   LDLCALC 118 (H) 04/28/2020 1317      Many patients benefit from treatment even if their cholesterol is at goal.  Goal: Total Cholesterol (CHOL) less than 160  Goal:   Triglycerides (TRIG) less than 150  Goal:  HDL greater than 40  Goal:  LDL (LDLCALC) less than 100   BLOOD PRESSURE American Stroke Association blood pressure target is less that 120/80 mm/Hg  Your discharge blood pressure is:  BP: 112/72  Monitor your blood pressure  Limit your salt and alcohol intake  Many individuals will require more than one medication for high blood pressure  DIABETES (A1c is a blood sugar average for last 3 months) Goal HGBA1c is under 7% (HBGA1c is blood sugar average for last 3 months)  Diabetes: No known diagnosis of diabetes    Lab Results  Component Value Date   HGBA1C 6.0 (H) 04/28/2020     Your HGBA1c can be lowered with medications, healthy diet, and exercise.  Check your blood sugar as directed by your physician  Call your physician if you experience unexplained or low blood sugars.  PHYSICAL ACTIVITY/REHABILITATION Goal is 30 minutes at least 4 days per week  Activity: Increase activity slowly, Therapies: Physical Therapy: Home Health Return to work:   Activity decreases your risk of heart attack and stroke and makes your heart stronger.  It helps control your weight and blood pressure; helps you relax and can improve  your mood.  Participate in a regular exercise program.  Talk with your doctor about the best form of exercise for you (dancing, walking, swimming, cycling).  DIET/WEIGHT Goal is to maintain a healthy weight  Your discharge diet is:  Diet Order            DIET DYS 3 Room service appropriate? No; Fluid consistency: Nectar Thick  Diet effective now                 liquids Your height is:  Height: 6\' 1"  (185.4 cm) Your current weight is:   Your Body Mass Index (BMI) is:     Following the type of diet specifically designed for you will help prevent another stroke.  Your goal weight range is:    Your goal Body Mass Index (BMI) is 19-24.  Healthy food habits can help reduce 3 risk factors for stroke:  High cholesterol,  hypertension, and excess weight.  RESOURCES Stroke/Support Group:  Call [phone]   STROKE EDUCATION PROVIDED/REVIEWED AND GIVEN TO PATIENT Stroke warning signs and symptoms How to activate emergency medical system (call 911). Medications prescribed at discharge. Need for follow-up after discharge. Personal risk factors for stroke. Pneumonia vaccine given:  Flu vaccine given:  My questions have been answered, the writing is legible, and I understand these instructions.  I will adhere to these goals & educational materials that have been provided to me after my discharge from the hospital.      My questions have been answered and I understand these instructions. I will adhere to these goals and the provided educational materials after my discharge from the hospital.  Patient/Caregiver Signature _______________________________ Date __________  Clinician Signature _______________________________________ Date __________  Please bring this form and your medication list with you to all your follow-up doctor's appointments.   =========================================================================  Information on my medicine - ELIQUIS (apixaban)  This medication education was reviewed with me or my healthcare representative as part of my discharge preparation.Marland Kitchen.  Why was Eliquis prescribed for you? Eliquis was prescribed to treat blood clots that may have been found in the veins of your legs (deep vein thrombosis) or in your lungs (pulmonary embolism) and to reduce the risk of them occurring again.  What do You need to know about Eliquis ? The starting dose is 10 mg (two 5 mg tablets) taken TWICE daily for the FIRST SEVEN (7) DAYS, then on (enter date)  11/26  the dose is reduced to ONE 5 mg tablet taken TWICE daily.  Eliquis may be taken with or without food.   Try to take the dose about the same time in the morning and in the evening. If you have difficulty swallowing the tablet  whole please discuss with your pharmacist how to take the medication safely.  Take Eliquis exactly as prescribed and DO NOT stop taking Eliquis without talking to the doctor who prescribed the medication.  Stopping may increase your risk of developing a new blood clot.  Refill your prescription before you run out.  After discharge, you should have regular check-up appointments with your healthcare provider that is prescribing your Eliquis.    What do you do if you miss a dose? If a dose of ELIQUIS is not taken at the scheduled time, take it as soon as possible on the same day and twice-daily administration should be resumed. The dose should not be doubled to make up for a missed dose.  Important Safety Information A possible side effect of Eliquis is  bleeding. You should call your healthcare provider right away if you experience any of the following: ? Bleeding from an injury or your nose that does not stop. ? Unusual colored urine (red or dark brown) or unusual colored stools (red or black). ? Unusual bruising for unknown reasons. ? A serious fall or if you hit your head (even if there is no bleeding).  Some medicines may interact with Eliquis and might increase your risk of bleeding or clotting while on Eliquis. To help avoid this, consult your healthcare provider or pharmacist prior to using any new prescription or non-prescription medications, including herbals, vitamins, non-steroidal anti-inflammatory drugs (NSAIDs) and supplements.  This website has more information on Eliquis (apixaban): http://www.eliquis.com/eliquis/home  =============================================================  Deep Vein Thrombosis    Deep vein thrombosis (DVT) is a condition in which a blood clot forms in a deep vein, such as a lower leg, thigh, or arm vein. A clot is blood that has thickened into a gel or solid. This condition is dangerous. It can lead to serious and even life-threatening  complications if the clot travels to the lungs and causes a blockage (pulmonary embolism). It can also damage veins in the leg. This can result in leg pain, swelling, discoloration, and sores (post-thrombotic syndrome).  What are the causes? This condition may be caused by:  A slowdown of blood flow.  Damage to a vein.  A condition that causes blood to clot more easily, such as an inherited clotting disorder.   What increases the risk? The following factors may make you more likely to develop this condition: 1. Being overweight. 2. Being older, especially over age 15. 3. Sitting or lying down for more than four hours. 4. Being in the hospital. 5. Lack of physical activity (sedentary lifestyle). 6. Pregnancy, being in childbirth, or having recently given birth. 7. Taking medicines that contain estrogen, such as medicines to prevent pregnancy. 8. Smoking. 9. A history of any of the following: ? Blood clots or a blood clotting disease. ? Peripheral vascular disease. ? Inflammatory bowel disease. ? Cancer. ? Heart disease. ? Genetic conditions that affect how your blood clots, such as Factor V Leiden mutation. ? Neurological diseases that affect your legs (leg paresis). ? A recent injury, such as a car accident. ? Major or lengthy surgery. ? A central line placed inside a large vein.  What are the signs or symptoms? Symptoms of this condition include:  Swelling, pain, or tenderness in an arm or leg.  Warmth, redness, or discoloration in an arm or leg. If the clot is in your leg, symptoms may be more noticeable or worse when you stand or walk. Some people may not develop any symptoms.  How is this diagnosed? This condition is diagnosed with: 1. A medical history and physical exam. 2. Tests, such as: ? Blood tests. These are done to check how well your blood clots. ? Ultrasound. This is done to check for clots. ? Venogram. For this test, contrast dye is injected into a vein  and X-rays are taken to check for any clots  How is this treated? Treatment for this condition depends on:  The cause of your DVT.  Your risk for bleeding or developing more clots.  Any other medical conditions that you have. Treatment may include: 1. Taking a blood thinner (anticoagulant). This type of medicine prevents clots from forming. It may be taken by mouth, injected under the skin, or injected through an IV (catheter). 2. Injecting clot-dissolving medicines into the  affected vein (catheter-directed thrombolysis). 3. Having surgery. Surgery may be done to: ? Remove the clot. ? Place a filter in a large vein to catch blood clots before they reach the lungs. Some treatments may be continued for up to six months.  Follow these instructions at home: If you are taking blood thinners: 1. Take the medicine exactly as told by your health care provider. Some blood thinners need to be taken at the same time every day. Do not skip a dose. 2. Talk with your health care provider before you take any medicines that contain aspirin or NSAIDs. These medicines increase your risk for dangerous bleeding. 3. Ask your health care provider about foods and drugs that could change the way the medicine works (may interact). Avoid those things if your health care provider tells you to do so. 4. Blood thinners can cause easy bruising and may make it difficult to stop bleeding. Because of this: ? Be very careful when using knives, scissors, or other sharp objects. ? Use an electric razor instead of a blade. ? Avoid activities that could cause injury or bruising, and follow instructions about how to prevent falls. 5. Wear a medical alert bracelet or carry a card that lists what medicines you take.  General instructions  Take over-the-counter and prescription medicines only as told by your health care provider.  Return to your normal activities as told by your health care provider. Ask your health care  provider what activities are safe for you.  Wear compression stockings if recommended by your health care provider.  Keep all follow-up visits as told by your health care provider. This is important.  How is this prevented? To lower your risk of developing this condition again: 1. For 30 or more minutes every day, do an activity that: ? Involves moving your arms and legs. ? Increases your heart rate. 2. When traveling for longer than four hours: ? Exercise your arms and legs every hour. ? Drink plenty of water. ? Avoid drinking alcohol. 3. Avoid sitting or lying for a long time without moving your legs. 4. If you have surgery or you are hospitalized, ask about ways to prevent blood clots. These may include taking frequent walks or using anticoagulants. 5. Stay at a healthy weight. 6. If you are a woman who is older than age 74, avoid unnecessary use of medicines that contain estrogen, such as some birth control pills. 7. Do not use any products that contain nicotine or tobacco, such as cigarettes and e-cigarettes. This is especially important if you take estrogen medicines. If you need help quitting, ask your health care provider.  Contact a health care provider if:  You miss a dose of your blood thinner.  Your menstrual period is heavier than usual.  You have unusual bruising.  Get help right away if: 1. You have: ? New or increased pain, swelling, or redness in an arm or leg. ? Numbness or tingling in an arm or leg. ? Shortness of breath. ? Chest pain. ? A rapid or irregular heartbeat. ? A severe headache or confusion. ? A cut that will not stop bleeding. 2. There is blood in your vomit, stool, or urine. 3. You have a serious fall or accident, or you hit your head. 4. You feel light-headed or dizzy. 5. You cough up blood.  These symptoms may represent a serious problem that is an emergency. Do not wait to see if the symptoms will go away. Get medical help right away.  Call  your local emergency services (911 in the [place] Do not drive yourself to the hospital. Summary  Deep vein thrombosis (DVT) is a condition in which a blood clot forms in a deep vein, such as a lower leg, thigh, or arm vein.  Symptoms can include swelling, warmth, pain, and redness in your leg or arm.  This condition may be treated with a blood thinner (anticoagulant medicine), medicine that is injected to dissolve blood clots,compression stockings, or surgery.  If you are prescribed blood thinners, take them exactly as told. This information is not intended to replace advice given to you by your health care provider. Make sure you discuss any questions you have with your health care provider. Document Revised: 05/19/2017 Document Reviewed: 11/04/2016 Elsevier Patient Education  2020 [employer].

## 2020-05-08 NOTE — Progress Notes (Signed)
ANTICOAGULATION CONSULT NOTE - Initial Consult  Pharmacy Consult for Apixaban Indication: LLE DVT 11/18  No Known Allergies  Patient Measurements: Height: 6\' 1"  (185.4 cm) Weight: 76 kg (167 lb 8.8 oz) IBW/kg (Calculated) : 79.9  Vital Signs: Temp: 98.6 F (37 C) (11/19 0322) Temp Source: Oral (11/19 0322) BP: 114/72 (11/19 0322) Pulse Rate: 95 (11/19 0322)  Labs: Recent Labs    05/08/20 0445  CREATININE 1.33*    Estimated Creatinine Clearance: 44.4 mL/min (A) (by C-G formula based on SCr of 1.33 mg/dL (H)).   Medical History: Past Medical History:  Diagnosis Date  . Hypertension      Assessment: 86 yo M with CVA now in CIR found to have a LLE DVT on 05/07/20. Pharmacy consulted for apixaban.   ClCr ~40 ml/min. No dose adjustment recommended for acute VTE.   Goal of Therapy:  Monitor platelets by anticoagulation protocol: Yes   Plan:  Start apixaban 10mg  BID x 7 days the decrease to 5mg  BID  Monitor for signs/symptoms of bleeding    05/09/20, PharmD, BCPS, BCCP Clinical Pharmacist  Please check AMION for all [facility] phone numbers After 10:00 PM, call Main Pharmacy [phone]

## 2020-05-08 NOTE — Progress Notes (Signed)
Occupational Therapy Weekly Progress Note  Patient Details  Name: Tyler Glass MRN: [id number] Date of Birth: [birth year]  Beginning of progress report period: May 02, 2020 End of progress report period: May 08, 2020  Today's Date: 05/08/2020 OT Individual Time: 1300-1410 OT Individual Time Calculation (min): 70 min    Patient has met 4 of 4 short term goals.  Pt progressing steadily and very motivated consistently during sessions.  Pt has increased independence during bathing, dressing, and functional transfers.  Pt is now able to complete squat pivot toilet transfer with CGA.  Pt still does have difficulty righting to midline due to right sided pushing in standing that is worse during dynamic tasks.  Pt is also still completing all tasks primarily at w/c level due to this impaired standing balance and functional ambulation.  Pt exhibits impaired short term memory which does limit carryover of skilled training at times requiring repetition for reinforcement.    Patient continues to demonstrate the following deficits: muscle weakness, motor apraxia, decreased coordination and decreased motor planning, decreased visual motor skills, decreased midline orientation and decreased motor planning, decreased awareness, decreased problem solving, decreased safety awareness and decreased memory and decreased sitting balance, decreased standing balance, decreased postural control and decreased balance strategies and therefore will continue to benefit from skilled OT intervention to enhance overall performance with BADL.  Patient progressing toward long term goals..  Continue plan of care.  OT Short Term Goals Week 1:  OT Short Term Goal 1 (Week 1): Pt will complete toilet transfer with min assist OT Short Term Goal 1 - Progress (Week 1): Met OT Short Term Goal 2 (Week 1): Pt will complete LB dressing with min assist OT Short Term Goal 2 - Progress (Week 1): Met OT Short Term Goal 3 (Week 1): Pt  will complete LB bathing with min assist OT Short Term Goal 3 - Progress (Week 1): Met OT Short Term Goal 4 (Week 1): Pt will consistently maintain trunk to midline sitting unsupported with CGA and min VCs. OT Short Term Goal 4 - Progress (Week 1): Met Week 2:  OT Short Term Goal 1 (Week 2): Pt will complete LB dressing with CGA OT Short Term Goal 2 (Week 2): Pt will complete stand pivot transfer with CGA in preperation for higher level toilet transfer. OT Short Term Goal 3 (Week 2): Pt will maintain dynamic standing balance during ADLs with CGA  Skilled Therapeutic Interventions/Progress Updates:    Pt sitting up in w/c, no c/o pain, agreeable to OT session.  Pt self propelled to bathroom with supervision and completed squat pivot to shower bench with CGA.  Pt doffed all clothing with CGA-supervision.  Pt bathed UB with supervision and LB with CGA during standing portion to wash periarea and buttocks. After drying off, pt completed squat pivot to w/c with CGA.  Pt transported to sink where he dressed UB with setup and LB with CGA, noting improved self righting to center with visual feedback from mirror.  Pt intermittently pushed to right however when OT provided VC to look in mirror, pt self corrected to center.  Pt called daughter for collaboration regarding home setup.  Per daughter, there is a half bathroom with toilet and sink on first floor, but shower is on second floor with 14 steps up bilateral hand rails.  Pts daughter also reports they do not own any DME at this time.  Pt sitting up in w/c at end of session, call bell in reach, seat alarm  on.  Therapy Documentation Precautions:  Precautions Precautions: Fall, Other (comment) Precaution Comments: R hemiparesis with strong R lean Restrictions Weight Bearing Restrictions: No   Therapy/Group: Individual Therapy  Ezekiel Slocumb 05/08/2020, 9:56 AM

## 2020-05-08 NOTE — Progress Notes (Signed)
Physical Therapy Session Note  Patient Details  Name: Tyler Glass MRN: [id number] Date of Birth: 1934-06-07  Today's Date: 05/08/2020 PT Individual Time: 1000-1100 PT Individual Time Calculation (min): 60 min   Short Term Goals: Week 1:  PT Short Term Goal 1 (Week 1): Pt will consistently perform sit<>stand transfers with min assist PT Short Term Goal 2 (Week 1): Pt will consistently perform stand pivot transfers with min assist using LRAD PT Short Term Goal 3 (Week 1): Pt will ambulate at least 44ft using LRAD with mod assist PT Short Term Goal 4 (Week 1): Pt will initaite stair training  Skilled Therapeutic Interventions/Progress Updates:    Pt greeted supine in bed, agreeable to PT session without c/o pain. Pt reports need to void on arrival. Supine<>sit with CGA with bed features. Able to sit EOB with CGA while he managed urinal, requiring modA for brief and pant management. Pt was continent of bladder while seated EOB, charted. Squat<>pivot with minA from EOB to w/c and wheeled sinkside for hand hygiene. W/c transport for time management from his room to main therapy gym for time management. Performed squat<>pivot transfer with minA from w/c to EOB. Mirror placed in front of him for visual feedback to due R pushing. Performed sit<>stand with minA from mat table with no AD and focused on static standing balance and postural awareness. Integrated forward stepping with minA guard and LLE; shows worsening R pushing with any functional mobilitly tasks. Different trials of techniques to encourage L weight shift and midline awareness. Placed high/low portable table, raised to height of mid-torso to place L elbow while completing static standing and single leg stepping. Slight improvement noted but continued to push R. Also trialed ambulated with Carley Hammed walker to see if this would reduce R pushing. He ambulated ~58ft + 40ft + 71ft with min/modA and Eva walker, continues to show R pushing with both of his  feet under his L shldr. Used tape on floor for visual cueing to promote wide BOS to keep his feet apart; limited carryover. Returned to his room in w/c where he remained seated with chair alarm on, needs in reach, NT present cleaning room.   Therapy Documentation Precautions:  Precautions Precautions: Fall, Other (comment) Precaution Comments: R hemiparesis with strong R lean Restrictions Weight Bearing Restrictions: No  Therapy/Group: Individual Therapy  Jenney Brester P Laria Grimmett PT 05/08/2020, 7:39 AM

## 2020-05-08 NOTE — Progress Notes (Signed)
PHYSICAL MEDICINE & REHABILITATION PROGRESS NOTE   Subjective/Complaints: Patient seen sitting up in bed this AM.  He states he slept well overnight. He denies leg pain today. He was found to have a DVT yesterday.  ROS: Denies CP, SOB, N/V/D  Objective:   VAS [place] LOWER EXTREMITY VENOUS (DVT)  Result Date: 05/07/2020  Lower Venous DVT Study Indications: Left lower extremity intermittent pain. Other Indications: Recent CVA, right lower extremity weakness. Comparison Study: No prior study Performing Technologist: Gertie Fey MHA, RDMS, RVT, RDCS  Examination Guidelines: A complete evaluation includes B-mode imaging, spectral Doppler, color Doppler, and power Doppler as needed of all accessible portions of each vessel. Bilateral testing is considered an integral part of a complete examination. Limited examinations for reoccurring indications may be performed as noted. The reflux portion of the exam is performed with the patient in reverse Trendelenburg.  +-----+---------------+---------+-----------+----------+--------------+ RIGHTCompressibilityPhasicitySpontaneityPropertiesThrombus Aging +-----+---------------+---------+-----------+----------+--------------+ CFV  Full           Yes      Yes                                 +-----+---------------+---------+-----------+----------+--------------+   +---------+---------------+---------+-----------+----------+--------------+ LEFT     CompressibilityPhasicitySpontaneityPropertiesThrombus Aging +---------+---------------+---------+-----------+----------+--------------+ CFV      Full           Yes      Yes                                 +---------+---------------+---------+-----------+----------+--------------+ SFJ      Full                                                        +---------+---------------+---------+-----------+----------+--------------+ FV Prox  Full                                                         +---------+---------------+---------+-----------+----------+--------------+ FV Mid   Full                                                        +---------+---------------+---------+-----------+----------+--------------+ FV DistalFull                                                        +---------+---------------+---------+-----------+----------+--------------+ PFV      Full                                                        +---------+---------------+---------+-----------+----------+--------------+ POP      Full  Yes      Yes                                 +---------+---------------+---------+-----------+----------+--------------+ PTV      None                    No                   Acute          +---------+---------------+---------+-----------+----------+--------------+ PERO     None                    No                   Acute          +---------+---------------+---------+-----------+----------+--------------+ Gastroc  None                    No                   Acute          +---------+---------------+---------+-----------+----------+--------------+     Summary: RIGHT: - No evidence of common femoral vein obstruction.  LEFT: - Findings consistent with acute deep vein thrombosis involving the left posterior tibial veins, left peroneal veins, and left gastrocnemius veins. - No cystic structure found in the popliteal fossa.  *See table(s) above for measurements and observations.    Preliminary    No results for input(s): WBC, HGB, HCT, PLT in the last 72 hours. Recent Labs    05/08/20 0445  NA 141  K 4.0  CL 104  CO2 29  GLUCOSE 117*  BUN 18  CREATININE 1.33*  CALCIUM 9.0    Intake/Output Summary (Last 24 hours) at 05/08/2020 1030 Last data filed at 05/08/2020 1000 Gross per 24 hour  Intake 496 ml  Output 1150 ml  Net -654 ml        Physical Exam: Vital Signs Blood pressure 114/72, pulse 95,  temperature 98.6 F (37 C), temperature source Oral, resp. rate 18, height 6\' 1"  (1.854 m), weight 76 kg, SpO2 96 %.  Constitutional: No distress . Vital signs reviewed. HENT: Normocephalic.  Atraumatic. Eyes: EOMI. No discharge. Cardiovascular: No JVD.  RRR. Respiratory: Normal effort.  No stridor.  Bilateral clear to auscultation. GI: Non-distended.  BS +. Skin: Warm and dry.  Intact. Psych: Normal mood.  Normal behavior. Musc: No edema in extremities.  No tenderness in extremities. Neuro: Alert Moderate dysarthria, improved Motor: 5/5 throughout, unchanged Mild facial weakness   Assessment/Plan: 1. Functional deficits which require 3+ hours per day of interdisciplinary therapy in a comprehensive inpatient rehab setting.  Physiatrist is providing close team supervision and 24 hour management of active medical problems listed below.  Physiatrist and rehab team continue to assess barriers to discharge/monitor patient progress toward functional and medical goals  Care Tool:  Bathing    Body parts bathed by patient: Right arm, Left arm, Chest, Abdomen, Front perineal area, Right upper leg, Left upper leg, Face, Buttocks   Body parts bathed by helper: Buttocks Body parts n/a: Left lower leg, Right lower leg   Bathing assist Assist Level: Minimal Assistance - Patient > 75%     Upper Body Dressing/Undressing Upper body dressing   What is the patient wearing?: Pull over shirt    Upper body assist Assist Level: Set up assist  Lower Body Dressing/Undressing Lower body dressing      What is the patient wearing?: Incontinence brief, Pants     Lower body assist Assist for lower body dressing: Minimal Assistance - Patient > 75%     Toileting Toileting    Toileting assist Assist for toileting: Set up assist Assistive Device Comment: urinal   Transfers Chair/bed transfer  Transfers assist     Chair/bed transfer assist level: Moderate Assistance - Patient 50 - 74%      Locomotion Ambulation   Ambulation assist      Assist level: Maximal Assistance - Patient 25 - 49% Assistive device: No Device Max distance: 7338ft   Walk 10 feet activity   Assist  Walk 10 feet activity did not occur: N/A  Assist level: Maximal Assistance - Patient 25 - 49% Assistive device: No Device   Walk 50 feet activity   Assist Walk 50 feet with 2 turns activity did not occur: Safety/medical concerns         Walk 150 feet activity   Assist Walk 150 feet activity did not occur: N/A         Walk 10 feet on uneven surface  activity   Assist Walk 10 feet on uneven surfaces activity did not occur: N/A         Wheelchair     Assist Will patient use wheelchair at discharge?:  (TBD)             Wheelchair 50 feet with 2 turns activity    Assist        Assist Level: Total Assistance - Patient < 25%   Wheelchair 150 feet activity     Assist          Medical Problem List and Plan: 1.  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  Antithrombotics: -DVT/anticoagulation:   Lovenox changed to Eliquis  Doppler showing posterior tib/peroneal and gastroc DVTs left lower extremity.             -antiplatelet therapy: Aspirin 81 mg daily, Plavix 75 mg daily DC'd due to initiation of Eliquis, transition to ASA after 3 to 6 months per neurology 3. Pain Management: Tylenol as needed  Muscle rub for left lateral ordered on 11/17  Controlled with meds on 11/19 4. Mood: Provide emotional support             -antipsychotic agents: N/A 5. Neuropsych: This patient is not fully capable of making decisions on his own behalf. 6. Skin/Wound Care: Routine skin checks 7. Fluids/Electrolytes/Nutrition: Routine in and outs 8.    Post stroke dysphagia.  Dysphagia #3 nectar liquids.             Advance diet as tolerated 9. Essential hypertension.  Patient on Norvasc 2.5 mg daily and Zestoretic 10-12.5 mg  daily prior to admission.  Resume as needed  Controlled on 11/19  Monitor with increased mobility 10.  Hyperlipidemia: Lipitor 11. Hiccups- Resolved  Continue baclofen 10 mg TID, decreased to 5 3 times daily on 11/17, d/ced on 11/18 12. Constipation  Improving 13.  Hypokalemia  Potassium 4.0 on 11/19  Supplemented x1 day 14.  Transaminitis  ALT remains elevated, but improving on 11/19 15.  Hypoalbuminemia  Supplement initiated on 11/15 16.?  CKD stage IIIa  GFR 53 on 11/19  Encourage fluids  Continue to monitor 17.  Acute blood loss anemia  Hemoglobin 12.3 on 11/15, labs ordered for Monday  Continue to monitor  LOS:  7 days A FACE TO FACE EVALUATION WAS PERFORMED  Chaselynn Kepple [name] 05/08/2020, 10:30 AM

## 2020-05-08 NOTE — Progress Notes (Signed)
Speech Language Pathology Daily Session Note  Patient Details  Name: Tyler Glass MRN: [id number] Date of Birth: 1934-02-23  Today's Date: 05/08/2020 SLP Individual Time: 0800-0900 SLP Individual Time Calculation (min): 60 min  Short Term Goals: Week 1: SLP Short Term Goal 1 (Week 1): Pt will consume dys 3 textures and nectar thick liquids with mod I use of swallowing precautions and minimal overt s/s of aspiration. SLP Short Term Goal 2 (Week 1): Pt will consume therapeutic trials of thin liquids with mod I use of swallowing precautions and no s/s of respiratory decompensation over 3 consecutive sessions. SLP Short Term Goal 3 (Week 1): Pt will utilize external aids to facilitate recall of daily information with min assist multimodal cues. SLP Short Term Goal 4 (Week 1): Pt will complete mildly complex functional tasks with min assist multimodal cues for functional problem solving. SLP Short Term Goal 5 (Week 1): Pt will utilize increased vocal intensity and overarticulation with supervision to achieve intelligibility at the conversational level  Skilled Therapeutic Interventions:   Patient seen to address dysphagia and cognitive-linguistic goals by SLP. Patient able to recall and demonstrate chin tuck against resistance exercise and Masako maneuver with SLP providing supervision level cues. He performed 10 repetitions of each. Patient consumed single sips of thin liquids (water) without overt coughing but with increase in frequency of throat clearing (of suspected pharyngeal residuals). SLP initiated water protocol for patient.  Patient participated in review and discussion of medication management with patient requiring min-modA cues to demonstrate understanding of purpose of his medications. Patient reported that he has his medications sent to him through the [place] and he was recently informed that a new 90 supply of one of his medications was being shipped, but he couldn't remember which one.  Patient with some increased awareness to his function telling SLP, "I don't know if I'll be able to live by myself anymore." Patient continues to benefit from skilled SLP intervention to maximize cognitive-linguistic and swallow function prior to discharge.    Pain Pain Assessment Pain Scale: 0-10 Pain Score: 0-No pain  Therapy/Group: Individual Therapy  Angela Nevin, MA, CCC-SLP Speech Therapy

## 2020-05-09 NOTE — Progress Notes (Signed)
[place] PHYSICAL MEDICINE & REHABILITATION PROGRESS NOTE   Subjective/Complaints: LEFT Calf DVT, started on apixaban  Yesterday .  No calf pain or swelling noted   ROS: Denies CP, SOB, N/V/D  Objective:   VAS [place] LOWER EXTREMITY VENOUS (DVT)  Result Date: 05/08/2020  Lower Venous DVT Study Indications: Left lower extremity intermittent pain. Other Indications: Recent CVA, right lower extremity weakness. Comparison Study: No prior study Performing Technologist: Gertie Fey MHA, RDMS, RVT, RDCS  Examination Guidelines: A complete evaluation includes B-mode imaging, spectral Doppler, color Doppler, and power Doppler as needed of all accessible portions of each vessel. Bilateral testing is considered an integral part of a complete examination. Limited examinations for reoccurring indications may be performed as noted. The reflux portion of the exam is performed with the patient in reverse Trendelenburg.  +-----+---------------+---------+-----------+----------+--------------+ RIGHTCompressibilityPhasicitySpontaneityPropertiesThrombus Aging +-----+---------------+---------+-----------+----------+--------------+ CFV  Full           Yes      Yes                                 +-----+---------------+---------+-----------+----------+--------------+   +---------+---------------+---------+-----------+----------+--------------+ LEFT     CompressibilityPhasicitySpontaneityPropertiesThrombus Aging +---------+---------------+---------+-----------+----------+--------------+ CFV      Full           Yes      Yes                                 +---------+---------------+---------+-----------+----------+--------------+ SFJ      Full                                                        +---------+---------------+---------+-----------+----------+--------------+ FV Prox  Full                                                         +---------+---------------+---------+-----------+----------+--------------+ FV Mid   Full                                                        +---------+---------------+---------+-----------+----------+--------------+ FV DistalFull                                                        +---------+---------------+---------+-----------+----------+--------------+ PFV      Full                                                        +---------+---------------+---------+-----------+----------+--------------+ POP      Full           Yes      Yes                                 +---------+---------------+---------+-----------+----------+--------------+  PTV      None                    No                   Acute          +---------+---------------+---------+-----------+----------+--------------+ PERO     None                    No                   Acute          +---------+---------------+---------+-----------+----------+--------------+ Gastroc  None                    No                   Acute          +---------+---------------+---------+-----------+----------+--------------+     Summary: RIGHT: - No evidence of common femoral vein obstruction.  LEFT: - Findings consistent with acute deep vein thrombosis involving the left posterior tibial veins, left peroneal veins, and left gastrocnemius veins. - No cystic structure found in the popliteal fossa.  *See table(s) above for measurements and observations. Electronically signed by Waverly Ferrarihristopher Dickson MD on 05/08/2020 at 3:54:30 PM.    Final    No results for input(s): WBC, HGB, HCT, PLT in the last 72 hours. Recent Labs    05/08/20 0445  NA 141  K 4.0  CL 104  CO2 29  GLUCOSE 117*  BUN 18  CREATININE 1.33*  CALCIUM 9.0    Intake/Output Summary (Last 24 hours) at 05/09/2020 0751 Last data filed at 05/09/2020 0711 Gross per 24 hour  Intake 1029 ml  Output 1350 ml  Net -321 ml        Physical  Exam: Vital Signs Blood pressure 131/71, pulse 80, temperature 98.2 F (36.8 C), temperature source Oral, resp. rate 16, height 6\' 1"  (1.854 m), weight 76 kg, SpO2 99 %.   General: No acute distress Mood and affect are appropriate Heart: Regular rate and rhythm no rubs murmurs or extra sounds Lungs: Clear to auscultation, breathing unlabored, no rales or wheezes Abdomen: Positive bowel sounds, soft nontender to palpation, nondistended Extremities: No clubbing, cyanosis, or edema Skin: No evidence of breakdown, no evidence of rash MSK- no calf pain with palpation   Moderate dysarthria, improved Motor: 5/5 throughout, unchanged Mild facial weakness   Assessment/Plan: 1. Functional deficits which require 3+ hours per day of interdisciplinary therapy in a comprehensive inpatient rehab setting.  Physiatrist is providing close team supervision and 24 hour management of active medical problems listed below.  Physiatrist and rehab team continue to assess barriers to discharge/monitor patient progress toward functional and medical goals  Care Tool:  Bathing    Body parts bathed by patient: Right arm, Left arm, Chest, Abdomen, Front perineal area, Right upper leg, Left upper leg, Face, Buttocks, Left lower leg, Right lower leg   Body parts bathed by helper: Buttocks Body parts n/a: Left lower leg, Right lower leg   Bathing assist Assist Level: Contact Guard/Touching assist     Upper Body Dressing/Undressing Upper body dressing   What is the patient wearing?: Pull over shirt    Upper body assist Assist Level: Set up assist    Lower Body Dressing/Undressing Lower body dressing      What is the patient wearing?: Incontinence brief, Pants  Lower body assist Assist for lower body dressing: Contact Guard/Touching assist     Toileting Toileting    Toileting assist Assist for toileting: Set up assist Assistive Device Comment: urinal   Transfers Chair/bed  transfer  Transfers assist     Chair/bed transfer assist level: Moderate Assistance - Patient 50 - 74%     Locomotion Ambulation   Ambulation assist      Assist level: Maximal Assistance - Patient 25 - 49% Assistive device: No Device Max distance: 41ft   Walk 10 feet activity   Assist  Walk 10 feet activity did not occur: N/A  Assist level: Maximal Assistance - Patient 25 - 49% Assistive device: No Device   Walk 50 feet activity   Assist Walk 50 feet with 2 turns activity did not occur: Safety/medical concerns         Walk 150 feet activity   Assist Walk 150 feet activity did not occur: N/A         Walk 10 feet on uneven surface  activity   Assist Walk 10 feet on uneven surfaces activity did not occur: N/A         Wheelchair     Assist Will patient use wheelchair at discharge?:  (TBD)             Wheelchair 50 feet with 2 turns activity    Assist        Assist Level: Total Assistance - Patient < 25%   Wheelchair 150 feet activity     Assist          Medical Problem List and Plan: 1.  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  Antithrombotics: -DVT/anticoagulation:   Lovenox changed to Eliquis  Doppler showing posterior tib/peroneal and gastroc DVTs left lower extremity.             -antiplatelet therapy: Aspirin 81 mg daily, Plavix 75 mg daily DC'd due to initiation of Eliquis, transition to ASA after 3 to 6 months per neurology 3. Pain Management: Tylenol as needed  Muscle rub for left lateral ordered on 11/17  Controlled with meds on 11/19 4. Mood: Provide emotional support             -antipsychotic agents: N/A 5. Neuropsych: This patient is not fully capable of making decisions on his own behalf. 6. Skin/Wound Care: Routine skin checks 7. Fluids/Electrolytes/Nutrition: Routine in and outs 8.    Post stroke dysphagia.  Dysphagia #3 nectar liquids.             Advance  diet as tolerated 9. Essential hypertension.  Patient on Norvasc 2.5 mg daily and Zestoretic 10-12.5 mg daily prior to admission.  Resume as needed   Vitals:   05/08/20 1935 05/09/20 0516  BP: 129/86 131/71  Pulse: 90 80  Resp: 17 16  Temp: 98.7 F (37.1 C) 98.2 F (36.8 C)  SpO2: 99% 99%  controlled 11/20 10.  Hyperlipidemia: Lipitor 11. Hiccups- Resolved  Continue baclofen 10 mg TID, decreased to 5 3 times daily on 11/17, d/ced on 11/18 12. Constipation  Improving 13.  Hypokalemia  Potassium 4.0 on 11/19  Supplemented x1 day 14.  Transaminitis  ALT remains elevated, but improving on 11/19 15.  Hypoalbuminemia  Supplement initiated on 11/15 16.?  CKD stage IIIa  GFR 53 on 11/19  Encourage fluids  Continue to monitor 17.  Acute blood loss anemia  Hemoglobin 12.3 on 11/15, labs ordered for Monday  Continue to  monitor  LOS: 8 days A FACE TO FACE EVALUATION WAS PERFORMED  Tyler Glass 05/09/2020, 7:51 AM

## 2020-05-10 ENCOUNTER — Inpatient Hospital Stay (HOSPITAL_COMMUNITY): Payer: Medicare HMO | Admitting: Occupational Therapy

## 2020-05-10 ENCOUNTER — Inpatient Hospital Stay (HOSPITAL_COMMUNITY): Payer: Medicare HMO

## 2020-05-10 NOTE — Progress Notes (Signed)
Occupational Therapy Session Note  Patient Details  Name: Tyler Glass MRN: [id number] Date of Birth: 05/20/1934  Today's Date: 05/10/2020 OT Individual Time: 0459-9774 OT Individual Time Calculation (min): 42 min    Short Term Goals: Week 2:  OT Short Term Goal 1 (Week 2): Pt will complete LB dressing with CGA OT Short Term Goal 2 (Week 2): Pt will complete stand pivot transfer with CGA in preperation for higher level toilet transfer. OT Short Term Goal 3 (Week 2): Pt will maintain dynamic standing balance during ADLs with CGA  Skilled Therapeutic Interventions/Progress Updates:    Pt greeted semi-reclined in bed and agreeable to OT treatment session focused on self care retraining. Pt completed bed mobility with supervision. Stand-pivot to wc with min A. Bathing/dressing completed at the sink with focus on sit<>stands and standing balance/endurance. Utilized mirror feedback to achieve midline posture 2/2 R lateral lean. CGA for balance overall.  Continued working on standing balance with overhead reaching activity with mirror used for visual feedback. Much improved standing balance with overall close supervision and intermittent CGA for lateral sway. OT administered water via water protocol after oral care. Pt used swallowing strategies appropriately and was able to consume 6 oz of water without signs of aspiration! Pt left seated in wc at need of session with chair alarm on, call bell in reach, and needs met.   Therapy Documentation Precautions:  Precautions Precautions: Fall, Other (comment) Precaution Comments: R hemiparesis with strong R lean Restrictions Weight Bearing Restrictions: No Pain:  Pt reports mild pain in R LE, no number given. Rest and repositioned for comfort  Therapy/Group: Individual Therapy  Valma Cava 05/10/2020, 8:57 AM

## 2020-05-10 NOTE — Progress Notes (Signed)
Speech Language Pathology Daily Session Note  Patient Details  Name: Tyler Glass MRN: [id number] Date of Birth: February 27, 1934  Today's Date: 05/10/2020 SLP Individual Time: 1200-1247 SLP Individual Time Calculation (min): 47 min  Short Term Goals: Week 1: SLP Short Term Goal 1 (Week 1): Pt will consume dys 3 textures and nectar thick liquids with mod I use of swallowing precautions and minimal overt s/s of aspiration. SLP Short Term Goal 2 (Week 1): Pt will consume therapeutic trials of thin liquids with mod I use of swallowing precautions and no s/s of respiratory decompensation over 3 consecutive sessions. SLP Short Term Goal 3 (Week 1): Pt will utilize external aids to facilitate recall of daily information with min assist multimodal cues. SLP Short Term Goal 4 (Week 1): Pt will complete mildly complex functional tasks with min assist multimodal cues for functional problem solving. SLP Short Term Goal 5 (Week 1): Pt will utilize increased vocal intensity and overarticulation with supervision to achieve intelligibility at the conversational level  Skilled Therapeutic Interventions:Skilled ST services focused on swallow skills. Pt completed oral care with setup assist. Pt consumed 8oz of thin liquid via cup sips with intermittent throat clear noted. Pt recalled swallow strategy with supervision A verbal cues and ability to utilize it when consuming thin liquid trials and later current diet mod I. Pt recalled pharyngeal strength exercise CTAR with supervision A verbal cues and Masako with mod A verbal cues. Pt completed x10 CTAR and x5 Masako with supervision A verbal cues for accuracy. Pt consumed lunch tray of dys 3 textures and nectar thick liquids via cup. Pt demonstrated mod I consuming small bites/sips and use of multiple swallows. No overt s/s aspiration noted. Pt did demonstrate occasional throat clearing when consuming current diet, however this occurred less often then during thin liquid  trials. SLP provided education for planned MBS for 11/23 and need to continue completion of exercise. Pt stated understanding and all questions were answered to satisfaction. Pt was left in room with call bell within reach and chair alarm set. SLP recommends to continue skilled services.     Pain Pain Assessment Pain Score: 0-No pain  Therapy/Group: Individual Therapy  Tanazia Achee  [facility] 05/10/2020, 3:05 PM

## 2020-05-10 NOTE — Progress Notes (Signed)
Physical Therapy Session Note  Patient Details  Name: Tyler Glass MRN: [id number] Date of Birth: 1934/05/28  Today's Date: 05/10/2020 PT Individual Time: 6767-2094 PT Individual Time Calculation (min): 88 min   Short Term Goals: Week 1:  PT Short Term Goal 1 (Week 1): Pt will consistently perform sit<>stand transfers with min assist PT Short Term Goal 2 (Week 1): Pt will consistently perform stand pivot transfers with min assist using LRAD PT Short Term Goal 3 (Week 1): Pt will ambulate at least 56ft using LRAD with mod assist PT Short Term Goal 4 (Week 1): Pt will initaite stair training  Skilled Therapeutic Interventions/Progress Updates:    Session focused on NMR using Lite gait to focus on gait training, postural control re-training, spatial awareness, and overall balance/coordination.  Sit <.> stand for donning of harness in standing with CGA with BUE support for balance (and to remove).  Trial 1: 121' x 3 min 12 sec @ 0.5 mph - BUE support. Cues provided for increasing R foot clearance (toe slap) for heel strike and awareness as fatigued R lateral lean increases. Initial cues for widening BOS to use lines on treadmill as visual cue.  Standing   Trial 2: 131' x 3 min 2 sec @ 0.5 mph; BUE support. Continues cues for increasing R foot clearance for increased heel strike. Less R lean noted.  Trial 3: 69' x 3 min 1 sec @ 0.3 mph; No UE support. Decreased step length noted, increased R foot slap. Initially maintained midline but increased R lean as fatigued (about 2 min in). Facilitated weightshift to aid with increased foot clearance.   Trial 4: 65' x 3 min 4 sec; @ 0.3 mph; retrogait with BUE support. Decreased step length, very shuffled at first but improved with repetition. Able to maintain midline.  Trial 5: 93' x 3 min 50 sec;@ 0.3 mph; retrogait with BUE support. Improved step length after about first 30 sec, mild lateral R lean but overall improved and able to maintain  with BUE support.   Trial 6: 79' x 5 sec; @ 0.3 mph; retro gait with BUE support. Improved step length quicker this time (within first 15 sec).   Education during seated rest breaks on purpose of bodyweight support treadmill training and carryover to progression of gait overground. Pt very pleased with activity and excited to try it again in future session    Therapy Documentation Precautions:  Precautions Precautions: Fall, Other (comment) Precaution Comments: R hemiparesis with strong R lean Restrictions Weight Bearing Restrictions: No   Pain:  reports a little discomfort in R calf (reports that has been present since stroke). No intervention needed.    Therapy/Group: Individual Therapy  Karolee Stamps Darrol Poke, PT, DPT, CBIS  05/10/2020, 11:45 AM

## 2020-05-11 ENCOUNTER — Inpatient Hospital Stay (HOSPITAL_COMMUNITY): Payer: Medicare HMO

## 2020-05-11 ENCOUNTER — Inpatient Hospital Stay (HOSPITAL_COMMUNITY): Payer: Medicare HMO | Admitting: Occupational Therapy

## 2020-05-11 LAB — BASIC METABOLIC PANEL
Anion gap: 10 (ref 5–15)
BUN: 15 mg/dL (ref 8–23)
CO2: 27 mmol/L (ref 22–32)
Calcium: 9.3 mg/dL (ref 8.9–10.3)
Chloride: 103 mmol/L (ref 98–111)
Creatinine, Ser: 1.29 mg/dL — ABNORMAL HIGH (ref 0.61–1.24)
GFR, Estimated: 55 mL/min — ABNORMAL LOW (ref >60)
Glucose, Bld: 112 mg/dL — ABNORMAL HIGH (ref 70–99)
Potassium: 3.6 mmol/L (ref 3.5–5.1)
Sodium: 140 mmol/L (ref 135–145)

## 2020-05-11 NOTE — Progress Notes (Signed)
PHYSICAL MEDICINE & REHABILITATION PROGRESS NOTE   Subjective/Complaints: Notes pain in swelling in left groin. Has had a hernia on right side many years ago and is concerned he may have another one. Ordered MRI pelvis w/ and w/o contrast.  Hiccups have improved, he is trying to avoid spicy foods.   ROS: Denies CP, SOB, N/V/D  Objective:   No results found. No results for input(s): WBC, HGB, HCT, PLT in the last 72 hours. No results for input(s): NA, K, CL, CO2, GLUCOSE, BUN, CREATININE, CALCIUM in the last 72 hours.  Intake/Output Summary (Last 24 hours) at 05/11/2020 1029 Last data filed at 05/11/2020 0437 Gross per 24 hour  Intake 472 ml  Output 975 ml  Net -503 ml        Physical Exam: Vital Signs Blood pressure 114/77, pulse 85, temperature 98.2 F (36.8 C), temperature source Oral, resp. rate 18, height 6\' 1"  (1.854 m), weight 76 kg, SpO2 97 %.  Gen: no distress, normal appearing HEENT: oral mucosa pink and moist, NCAT Cardio: Reg rate Chest: normal effort, normal rate of breathing Abd: soft, non-distended, tenderness to palpation left groin with small palpable mass.  Ext: no edema Skin: intact Psych: pleasant, normal affect MSK- no calf pain with palpation  Moderate dysarthria, improved Motor: 5/5 throughout, unchanged Mild facial weakness   Assessment/Plan: 1. Functional deficits which require 3+ hours per day of interdisciplinary therapy in a comprehensive inpatient rehab setting.  Physiatrist is providing close team supervision and 24 hour management of active medical problems listed below.  Physiatrist and rehab team continue to assess barriers to discharge/monitor patient progress toward functional and medical goals  Care Tool:  Bathing    Body parts bathed by patient: Right arm, Left arm, Chest, Abdomen, Front perineal area, Right upper leg, Left upper leg, Face, Buttocks, Left lower leg, Right lower leg   Body parts bathed by helper:  Buttocks Body parts n/a: Left lower leg, Right lower leg   Bathing assist Assist Level: Contact Guard/Touching assist     Upper Body Dressing/Undressing Upper body dressing   What is the patient wearing?: Pull over shirt    Upper body assist Assist Level: Set up assist    Lower Body Dressing/Undressing Lower body dressing      What is the patient wearing?: Incontinence brief, Pants     Lower body assist Assist for lower body dressing: Contact Guard/Touching assist     Toileting Toileting    Toileting assist Assist for toileting: Minimal Assistance - Patient > 75% Assistive Device Comment: urinal   Transfers Chair/bed transfer  Transfers assist     Chair/bed transfer assist level: Minimal Assistance - Patient > 75%     Locomotion Ambulation   Ambulation assist      Assist level: Maximal Assistance - Patient 25 - 49% Assistive device: No Device Max distance: 24ft   Walk 10 feet activity   Assist  Walk 10 feet activity did not occur: N/A  Assist level: Maximal Assistance - Patient 25 - 49% Assistive device: No Device   Walk 50 feet activity   Assist Walk 50 feet with 2 turns activity did not occur: Safety/medical concerns         Walk 150 feet activity   Assist Walk 150 feet activity did not occur: N/A         Walk 10 feet on uneven surface  activity   Assist Walk 10 feet on uneven surfaces activity did not occur: N/A  Wheelchair     Assist Will patient use wheelchair at discharge?:  (TBD)             Wheelchair 50 feet with 2 turns activity    Assist        Assist Level: Total Assistance - Patient < 25%   Wheelchair 150 feet activity     Assist          Medical Problem List and Plan: 1.  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  Antithrombotics: -DVT/anticoagulation:   Lovenox changed to Eliquis  Doppler showing posterior tib/peroneal and  gastroc DVTs left lower extremity.             -antiplatelet therapy: Aspirin 81 mg daily, Plavix 75 mg daily DC'd due to initiation of Eliquis, transition to ASA after 3 to 6 months per neurology 3. Pain Management: Tylenol as needed  Muscle rub for left lateral ordered on 11/17  Controlled with meds on 11/19  11/22: complaining of left groin pain, has palpable mass. MRI pelvis w/ and w/o contrast ordered to assess for hernia.  4. Mood: Provide emotional support             -antipsychotic agents: N/A 5. Neuropsych: This patient is not fully capable of making decisions on his own behalf. 6. Skin/Wound Care: Routine skin checks 7. Fluids/Electrolytes/Nutrition: Routine in and outs 8.    Post stroke dysphagia.  Dysphagia #3 nectar liquids.             Advance diet as tolerated 9. Essential hypertension.  Patient on Norvasc 2.5 mg daily and Zestoretic 10-12.5 mg daily prior to admission.  Resume as needed   Vitals:   05/10/20 1930 05/11/20 0437  BP: 136/89 114/77  Pulse: 94 85  Resp: 18 18  Temp: 98 F (36.7 C) 98.2 F (36.8 C)  SpO2: 100% 97%  11/22: well controlled.  10.  Hyperlipidemia: Lipitor 11. Hiccups- Resolved  Continue baclofen 10 mg TID, decreased to 5 3 times daily on 11/17, d/ced on 11/18 12. Constipation  Improving 13.  Hypokalemia  Potassium 4.0 on 11/19  Supplemented x1 day 14.  Transaminitis  ALT remains elevated, but improving on 11/19 15.  Hypoalbuminemia  Supplement initiated on 11/15 16.?  CKD stage IIIa  GFR 53 on 11/19  Encourage fluids  Continue to monitor 17.  Acute blood loss anemia  Hemoglobin 12.3 on 11/15, labs ordered for Monday  Continue to monitor  LOS: 10 days A FACE TO FACE EVALUATION WAS PERFORMED  Drema Pry Florrie Ramires 05/11/2020, 10:29 AM

## 2020-05-11 NOTE — Progress Notes (Signed)
Physical Therapy Session Note  Patient Details  Name: Tyler Glass MRN: [id number] Date of Birth: Nov 30, 1933  Today's Date: 05/11/2020 PT Individual Time: 0900-0925 PT Individual Time Calculation (min): 25 min   Short Term Goals: Week 1:  PT Short Term Goal 1 (Week 1): Pt will consistently perform sit<>stand transfers with min assist PT Short Term Goal 2 (Week 1): Pt will consistently perform stand pivot transfers with min assist using LRAD PT Short Term Goal 3 (Week 1): Pt will ambulate at least 54ft using LRAD with mod assist PT Short Term Goal 4 (Week 1): Pt will initaite stair training  Skilled Therapeutic Interventions/Progress Updates:   Received pt supine in bed with NT present to assist with toileting, PT took over with care, pt agreeable to therapy, and reported mild pain in groin but did not state pain level. Session with emphasis on functional mobility/transfers, toileting, generalized strengthening, dynamic standing balance/coordination, and improved activity tolerance. Pt reported urge to use restroom and transferred supine<>sitting EOB with bedrails and supervision and transferred bed<>WC squat<>pivot without AD and min A. Squat<>pivot WC<>toilet with bedside commode over top with min A and pt able to doff brief/pants with min A for standing balance. Pt able to void and with medium loose BM (NT aware). Doffed dirty brief and donned clean one total A and pt transferred sit<>stand with min A and able to perform hygiene management with CGA and increased time and required min A to pull pants/brief over hips. Noted moderate pushing to R while standing to perform hygiene management. Squat<>pivot commode<>WC with min A and pt sat in WC and washed hands, brushed teeth, and washed face with supervision. Laboratory tech present to draw blood during session. Concluded session with pt sitting in WC, needs within reach, and chair pad alarm on.   Therapy Documentation Precautions:   Precautions Precautions: Fall, Other (comment) Precaution Comments: R hemiparesis with strong R lean Restrictions Weight Bearing Restrictions: No   Therapy/Group: Individual Therapy Martin Majestic PT, DPT   05/11/2020, 7:21 AM

## 2020-05-11 NOTE — Progress Notes (Signed)
Occupational Therapy Session Note  Patient Details  Name: Tyler Glass MRN: [id number] Date of Birth: 06/15/1934  Today's Date: 05/11/2020 OT Individual Time: 0903-0149 OT Individual Time Calculation (min): 24 min    Short Term Goals: Week 2:  OT Short Term Goal 1 (Week 2): Pt will complete LB dressing with CGA OT Short Term Goal 2 (Week 2): Pt will complete stand pivot transfer with CGA in preperation for higher level toilet transfer. OT Short Term Goal 3 (Week 2): Pt will maintain dynamic standing balance during ADLs with CGA  Skilled Therapeutic Interventions/Progress Updates:    Pt received in w/c with no c/o pain but reporting fatigue. Pt requested to use urinal and with set up assist he was able to self manage use and void 150 cc. Pt was taken to the therapy gym via w/c. He stood with CGA and required min-mod A for standing balance assist 2/2 R lean that worsened with fatigue. At the [facility] system pt completed functional reaching activity with sequencing component. Pt required mod cueing to sequence through alphabet. Mod cueing throughout for midline orientation/lean correction. Pt returned to his room and requested to be put back to bed. CGA squat pivot transfer. Pt was left supine with all needs met.   Therapy Documentation Precautions:  Precautions Precautions: Fall, Other (comment) Precaution Comments: R hemiparesis with strong R lean Restrictions Weight Bearing Restrictions: No  Therapy/Group: Individual Therapy  Curtis Sites 05/11/2020, 3:27 PM

## 2020-05-11 NOTE — Progress Notes (Signed)
Occupational Therapy Session Note  Patient Details  Name: Tyler Glass MRN: [id number] Date of Birth: 12-08-1933  Today's Date: 05/11/2020 OT Individual Time: 1300-1345 OT Individual Time Calculation (min): 45 min    Short Term Goals: Week 1:  OT Short Term Goal 1 (Week 1): Pt will complete toilet transfer with min assist OT Short Term Goal 1 - Progress (Week 1): Met OT Short Term Goal 2 (Week 1): Pt will complete LB dressing with min assist OT Short Term Goal 2 - Progress (Week 1): Met OT Short Term Goal 3 (Week 1): Pt will complete LB bathing with min assist OT Short Term Goal 3 - Progress (Week 1): Met OT Short Term Goal 4 (Week 1): Pt will consistently maintain trunk to midline sitting unsupported with CGA and min VCs. OT Short Term Goal 4 - Progress (Week 1): Met Week 2:  OT Short Term Goal 1 (Week 2): Pt will complete LB dressing with CGA OT Short Term Goal 2 (Week 2): Pt will complete stand pivot transfer with CGA in preperation for higher level toilet transfer. OT Short Term Goal 3 (Week 2): Pt will maintain dynamic standing balance during ADLs with CGA  Skilled Therapeutic Interventions/Progress Updates:    patient seated in w/c, denies pain or need to use the bathroom at this time.  He requests to change pants due to fit.  Sit to stand from w/c with CGA, cues for midline and use of rail for support.  He is able to doff pants in standing position with min A, donn pants seated with CS, CGA in stance for CM.   SPT to/from mat table in gym with minA, cues for midline and technique.  Completed seated and standing balance, GMC, midline orientation, weight shift, stepping and dexterity activities utilizing ball, dowel etc, with mirror for visual cue - note improved midline with challenge, able to maintain squat position for ball figure eight activity.  He remained seated in w/c at close of session, seat belt alarm set and callbell / tray table in reach.    Therapy  Documentation Precautions:  Precautions Precautions: Fall, Other (comment) Precaution Comments: R hemiparesis with strong R lean Restrictions Weight Bearing Restrictions: No   Therapy/Group: Individual Therapy  Carlos Levering 05/11/2020, 7:42 AM

## 2020-05-11 NOTE — Progress Notes (Signed)
Speech Language Pathology Weekly Progress and Session Note  Patient Details  Name: Tyler Glass MRN: [id number] Date of Birth: 15-Jul-1933  Beginning of progress report period: May 04, 2020 End of progress report period: May 11, 2020  Today's Date: 05/11/2020 SLP Individual Time: 1105-1203 SLP Individual Time Calculation (min): 58 min  Short Term Goals: Week 1: SLP Short Term Goal 1 (Week 1): Pt will consume dys 3 textures and nectar thick liquids with mod I use of swallowing precautions and minimal overt s/s of aspiration. SLP Short Term Goal 1 - Progress (Week 1): Met SLP Short Term Goal 2 (Week 1): Pt will consume therapeutic trials of thin liquids with mod I use of swallowing precautions and no s/s of respiratory decompensation over 3 consecutive sessions. SLP Short Term Goal 2 - Progress (Week 1): Met SLP Short Term Goal 3 (Week 1): Pt will utilize external aids to facilitate recall of daily information with min assist multimodal cues. SLP Short Term Goal 3 - Progress (Week 1): Not met SLP Short Term Goal 4 (Week 1): Pt will complete mildly complex functional tasks with min assist multimodal cues for functional problem solving. SLP Short Term Goal 4 - Progress (Week 1): Not met SLP Short Term Goal 5 (Week 1): Pt will utilize increased vocal intensity and overarticulation with supervision to achieve intelligibility at the conversational level SLP Short Term Goal 5 - Progress (Week 1): Met    New Short Term Goals: Week 2: SLP Short Term Goal 1 (Week 2): Pt will participate in instrumental swallow assessment to determine least restrictive diet. SLP Short Term Goal 2 (Week 2): Pt will consume current diet with mod I use of swallow strategies. SLP Short Term Goal 3 (Week 2): Pt will complete pharyngeal strength exercises with supervision A verbal cues for accuracy. SLP Short Term Goal 4 (Week 2): Pt will complete basic-mildly complex problem solving tasks with min A verbal  cues. SLP Short Term Goal 5 (Week 2): Pt will utilize internal and external aids to facilitate recall of daily information with min assist multimodal cues.  Weekly Progress Updates: Pt made moderate progress meeting 3 out 5 goals this reporting period. Pt participated in medication management with novel pill organizer, however has agreed to allow daughter to manage medication and money at discharge. Pt demonstrated improvement in recall of familiar task, however continued deficits in short term recall impact carryover of novel protocols, such as pharyngeal exercises. SLP adjusted and downgraded cognitive goals to reflect baseline abilities and slow progress. Pt supports baseline deficits in reading as well as glasses not being present, making use of external aids difficulty to carryover novel information. Pt has demonstrated overall reduction in intermittent throat clear with and without PO intake, participating in water protocol and pharyngeal strength exercises. Instrumental assessment MBS is planned for tomorrow. Pt would benefit from skilled ST services in order to maximize functional independence and reduce burden of care, requiring supervision at discharge with continued skilled ST services.       Intensity: Minumum of 1-2 x/day, 30 to 90 minutes Frequency: 3 to 5 out of 7 days Duration/Length of Stay: 12/3 Treatment/Interventions: Cognitive remediation/compensation;Cueing hierarchy;Environmental controls;Dysphagia/aspiration precaution training;Internal/external aids;Speech/Language facilitation;Functional tasks;Patient/family education   Daily Session  Skilled Therapeutic Interventions:Skilled ST services focused on swallow and cognitive skills. Pt demonstrated recall of pharyngeal strength exercises with mod A verbal cues and demonstrated CTAR x10 and Masako x5 with min multimodal cues for accuracy. Pt completed oral care with set up assist and consumed 8oz  thin liquids via cup sips with no  overt s/s aspiration noted. Pt also demonstrated no throat clearing throughout session, compared to intermittent throat clearing noted in pervious sessions. SLP continued to facilitate medication management skill utilizing TIB pill organizer. Pt was able to demonstrate mildly complex problem solving, medication consumed x3 a day however functionally, likely due to memory deficits, required max A verbal cues to fill out organizer. Pt supported his daughter will management medication and money. Pt supports keeping track of [facility] as well as writing simple notes on a calendar. SLP downgraded cognitive goals to reflect baseline abilities and slow progress in memory limited by reduced baseline abilities in reading. SLP educated pt on scheduled MBS for tomorrow and will plan simple calendar task at next available treatment session. Pt was left in room with call bell within reach and chair alarm set. SLP recommends to continue skilled services.    General    Pain Pain Assessment Pain Score: 0-No pain  Therapy/Group: Individual Therapy  Amirr Achord  [facility] 05/11/2020, 3:57 PM

## 2020-05-11 NOTE — Progress Notes (Signed)
Physical Therapy Weekly Progress Note  Patient Details  Name: Tyler Glass MRN: [id number] Date of Birth: [birth year]  Beginning of progress report period: May 02, 2020 End of progress report period: May 11, 2020  Today's Date: 05/11/2020 PT Individual Time: 1000-1045 PT Individual Time Calculation (min): 45 min   Patient has met 3 of 4 short term goals.  Pt making appropriate progress towards goals. He is able to complete bed mobility with supervision, squat<>pivot transfers with CGA/minA, and has ambulated ~50-33f with +2 modA with HHA and up to ~1523fwith PWBS on LiteGait. Have not initiated stair training to date. Functional mobility continues to be primarily limited by decreased midline awareness with strong R pushing (in standing), mild R HB weakness, decreased safety awareness, decreased memory, and impaired ability to initiate corrections with R pushing LOB. Despite these deficits, he is strongly motivated to regaining indep and maximizing mobility gains.  Patient continues to demonstrate the following deficits muscle weakness, impaired timing and sequencing, unbalanced muscle activation, decreased coordination and decreased motor planning, decreased midline orientation and decreased motor planning, decreased attention, decreased awareness, decreased problem solving, decreased safety awareness and decreased memory and decreased sitting balance, decreased standing balance, decreased postural control and decreased balance strategies and therefore will continue to benefit from skilled PT intervention to increase functional independence with mobility.  Patient progressing toward long term goals.  Continue plan of care. Will modify goals next weekly if needed.  PT Short Term Goals Week 1:  PT Short Term Goal 1 (Week 1): Pt will consistently perform sit<>stand transfers with min assist PT Short Term Goal 2 (Week 1): Pt will consistently perform stand pivot transfers with min assist  using LRAD PT Short Term Goal 3 (Week 1): Pt will ambulate at least 5071fsing LRAD with mod assist PT Short Term Goal 4 (Week 1): Pt will initaite stair training Week 2:  PT Short Term Goal 1 (Week 2): Pt will perform sit<>stand transfers with CGA and LRAD PT Short Term Goal 2 (Week 2): Pt will perform bed<>chair transfers with supervision and LRAD PT Short Term Goal 3 (Week 2): Pt will ambulate 100f86fth minA and LRAD PT Short Term Goal 4 (Week 2): Pt will initiate stair training   Skilled Therapeutic Interventions/Progress Updates:    Pt received sitting in w/c, agreeable to PT session with reports of no pain. W/c transport for time management to main therapy gym. Performed squat<>pivot transfer with CGA from w/c to mat table. Performed repeated sit<>stands with minnA and mirror for visual feedback due to R pushing. Able to maintain standing with minA guard with no BUE support. Performed cervical rotations both L<>R and up/down in standing with minA guard, LOB and pushing R while completing this. Also performed diagonal reaching with LUE + RUE in standing, requiring min/modA guard due to R pushing. Performed squat<>pivot with CGA from mat table back to w/c and wheeled to dayroom gym to focus remainder of session on gait training with LiteGait over treadmill. Sit<>stand for donning harness in standing with CGA with BUE support for balance (and to remove at end).   Trial 1: 110ft67fmin 33m0.5mph -93mE support with cues for increasing R heel strike, widening BOS, and reducing R lateral lean. Deficits worsen at around 2 minute mark due to fatigue  Trial 2: 195ft x [id number] 0.5mph - At73mpted ambulation with no BUE support but significant unsteadines leading to requiring BUE support on LiteGait. Gait deficits similar as what was written above  and continuous visual and verbal cueing for R foot clearance and "soft" landing.   Ended session by attempting functional ambulation with +2 HHA on level  surfaces without LiteGait. He ambulated ~19f, regressing from minA +2 to a heavy modA due to R pushing. Continues to have both of his feet with bias towards L side and extremely narrow BOS. Returned to his room where he remained seated in w/c with chair alarm on, needs in reach.   Therapy Documentation Precautions:  Precautions Precautions: Fall, Other (comment) Precaution Comments: R hemiparesis with strong R lean Restrictions Weight Bearing Restrictions: No  Therapy/Group: Individual Therapy  [name] PT 05/11/2020, 7:54 AM

## 2020-05-12 ENCOUNTER — Inpatient Hospital Stay (HOSPITAL_COMMUNITY): Payer: Medicare HMO

## 2020-05-12 ENCOUNTER — Inpatient Hospital Stay (HOSPITAL_COMMUNITY): Payer: Medicare HMO | Admitting: Occupational Therapy

## 2020-05-12 ENCOUNTER — Inpatient Hospital Stay (HOSPITAL_COMMUNITY): Payer: Medicare HMO | Admitting: Physical Therapy

## 2020-05-12 ENCOUNTER — Encounter (HOSPITAL_COMMUNITY): Payer: Medicare HMO | Admitting: Speech Pathology

## 2020-05-12 DIAGNOSIS — S76912A Strain of unspecified muscles, fascia and tendons at thigh level, left thigh, initial encounter: Secondary | ICD-10-CM

## 2020-05-12 DIAGNOSIS — N1831 Chronic kidney disease, stage 3a: Secondary | ICD-10-CM

## 2020-05-12 MED ORDER — POLYETHYLENE GLYCOL 3350 17 G PO PACK
17.0000 g | PACK | Freq: Every day | ORAL | Status: DC
  Administered 2020-05-12 – 2020-05-13 (×2): 17 g via ORAL
  Filled 2020-05-12: qty 1

## 2020-05-12 MED ORDER — METHOCARBAMOL 500 MG PO TABS
500.0000 mg | ORAL_TABLET | Freq: Three times a day (TID) | ORAL | Status: DC | PRN
  Administered 2020-05-18: 500 mg via ORAL
  Filled 2020-05-12 (×2): qty 1

## 2020-05-12 NOTE — Plan of Care (Signed)
  Problem: Consults Goal: RH STROKE PATIENT EDUCATION Description: See Patient Education module for education specifics  Outcome: Progressing   

## 2020-05-12 NOTE — Progress Notes (Signed)
[facility] PHYSICAL MEDICINE & REHABILITATION PROGRESS NOTE   Subjective/Complaints: Patient seen laying in bed this morning.  He states he slept well overnight.  He denies complaints.  He denies leg pain.  ROS: Denies CP, SOB, N/V/D  Objective:   MR PELVIS WO CONTRAST  Result Date: 05/12/2020 CLINICAL DATA:  Left lower quadrant abdominal pain. Possible hernia. EXAM: MRI PELVIS WITHOUT CONTRAST TECHNIQUE: Multiplanar multisequence MR imaging of the pelvis was performed. No intravenous contrast was administered. COMPARISON:  None. FINDINGS: Examination limited by patient motion. Patient would not or could not hold still for the examination. Urinary Tract: The bladder is unremarkable. No bladder mass, calculi or asymmetric bladder wall thickening. Mild uniform bladder wall thickening could be due to lack of distension or possibly partial bladder outlet obstruction. Bowel: Large amount of stool in the rectum could suggest fecal impaction. No rectal mass. The visualized sigmoid colon is grossly normal and the visualized small-bowel loops are grossly normal. Vascular/Lymphatic: No aneurysm or lymphadenopathy. Reproductive: The prostate gland is within normal limits in size for age. No lesions are identified. The seminal vesicles appear normal. Other:  No inguinal mass, adenopathy or inguinal hernia. Musculoskeletal: Edema like signal changes in the left iliopsoas muscle along with tendinopathy and involving the iliopsoas tendon. Some surrounding fluid is also noted. Both hips are normally located. No significant degenerative changes, stress fracture or AVN. The pubic symphysis and SI joints are intact. No pelvic fractures or bone lesions. IMPRESSION: 1. Left iliopsoas muscle edema suggesting a muscle strain or partial tear. There is also tendinopathy involving the iliopsoas tendon but no complete tear/rupture. 2. Large amount of stool in the rectum could suggest fecal impaction. 3. No pelvic fractures or  bone lesions. 4. No inguinal mass or hernia. Electronically Signed   By: Rudie Meyer M.D.   On: 05/12/2020 07:43   No results for input(s): WBC, HGB, HCT, PLT in the last 72 hours. Recent Labs    05/11/20 0917  NA 140  K 3.6  CL 103  CO2 27  GLUCOSE 112*  BUN 15  CREATININE 1.29*  CALCIUM 9.3    Intake/Output Summary (Last 24 hours) at 05/12/2020 0826 Last data filed at 05/12/2020 0035 Gross per 24 hour  Intake 840 ml  Output 775 ml  Net 65 ml        Physical Exam: Vital Signs Blood pressure 116/75, pulse 83, temperature 97.8 F (36.6 C), resp. rate 18, height 6\' 1"  (1.854 m), weight 76 kg, SpO2 97 %.  Constitutional: No distress . Vital signs reviewed. HENT: Normocephalic.  Atraumatic. Eyes: EOMI. No discharge. Cardiovascular: No JVD.  RRR. Respiratory: Normal effort.  No stridor.  Bilateral clear to auscultation. GI: Non-distended.  BS +. Skin: Warm and dry.  Intact. Psych: Normal mood.  Normal behavior. Musc: No edema in extremities.  No tenderness in extremities. Neuro: Alert Moderate dysarthria, stable Motor: 5/5 throughout, stable Mild facial weakness   Assessment/Plan: 1. Functional deficits which require 3+ hours per day of interdisciplinary therapy in a comprehensive inpatient rehab setting.  Physiatrist is providing close team supervision and 24 hour management of active medical problems listed below.  Physiatrist and rehab team continue to assess barriers to discharge/monitor patient progress toward functional and medical goals  Care Tool:  Bathing    Body parts bathed by patient: Right arm, Left arm, Chest, Abdomen, Front perineal area, Right upper leg, Left upper leg, Face, Buttocks, Left lower leg, Right lower leg   Body parts bathed by helper:  Buttocks Body parts n/a: Left lower leg, Right lower leg   Bathing assist Assist Level: Contact Guard/Touching assist     Upper Body Dressing/Undressing Upper body dressing   What is the patient  wearing?: Pull over shirt    Upper body assist Assist Level: Set up assist    Lower Body Dressing/Undressing Lower body dressing      What is the patient wearing?: Incontinence brief, Pants     Lower body assist Assist for lower body dressing: Contact Guard/Touching assist     Toileting Toileting    Toileting assist Assist for toileting: Minimal Assistance - Patient > 75% Assistive Device Comment: urinal   Transfers Chair/bed transfer  Transfers assist     Chair/bed transfer assist level: Minimal Assistance - Patient > 75%     Locomotion Ambulation   Ambulation assist      Assist level: Maximal Assistance - Patient 25 - 49% Assistive device: No Device Max distance: 16ft   Walk 10 feet activity   Assist  Walk 10 feet activity did not occur: N/A  Assist level: Maximal Assistance - Patient 25 - 49% Assistive device: No Device   Walk 50 feet activity   Assist Walk 50 feet with 2 turns activity did not occur: Safety/medical concerns         Walk 150 feet activity   Assist Walk 150 feet activity did not occur: N/A         Walk 10 feet on uneven surface  activity   Assist Walk 10 feet on uneven surfaces activity did not occur: N/A         Wheelchair     Assist Will patient use wheelchair at discharge?:  (TBD)             Wheelchair 50 feet with 2 turns activity    Assist        Assist Level: Total Assistance - Patient < 25%   Wheelchair 150 feet activity     Assist          Medical Problem List and Plan: 1.  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  Antithrombotics: -DVT/anticoagulation:   Lovenox changed to Eliquis  Doppler showing posterior tib/peroneal and gastroc DVTs left lower extremity.             -antiplatelet therapy: Aspirin 81 mg daily, Plavix 75 mg daily DC'd due to initiation of Eliquis, transition to ASA after 3 to 6 months per neurology 3. Pain  Management: Tylenol as needed  Robaxin as needed  Controlled with meds on 11/23 4. Mood: Provide emotional support             -antipsychotic agents: N/A 5. Neuropsych: This patient is not fully capable of making decisions on his own behalf. 6. Skin/Wound Care: Routine skin checks 7. Fluids/Electrolytes/Nutrition: Routine in and outs 8.    Post stroke dysphagia.  Dysphagia #3 nectar liquids.             Advance diet as tolerated 9. Essential hypertension.  Patient on Norvasc 2.5 mg daily and Zestoretic 10-12.5 mg daily prior to admission.  Resume as needed   Vitals:   05/11/20 1928 05/12/20 0449  BP: 124/81 116/75  Pulse: 96 83  Resp: 18 18  Temp: 97.9 F (36.6 C) 97.8 F (36.6 C)  SpO2: 100% 97%   Controlled on 11/23 10.  Hyperlipidemia: Lipitor 11. Hiccups- Resolved  Continue baclofen 10 mg TID, decreased to 5 3 times daily  on 11/17, d/ced on 11/18 12. Constipation  Bowel meds increased on 11/23 13.  Hypokalemia  Potassium 3.6 on 11/22, plan for repeat labs later this week  Supplemented x1 day 14.  Transaminitis  ALT remains elevated, but improving on 11/19 15.  Hypoalbuminemia  Supplement initiated on 11/15 16.?  CKD stage IIIa  GFR 55 on 11/22  Encourage fluids  Continue to monitor 17.  Acute blood loss anemia  Hemoglobin 12.3 on 11/15, labs ordered for tomorrow  Continue to monitor 18.  Left iliopsoas strain  MRI suggesting muscle strain versus partial tear with tendinopathy  See #3  LOS: 11 days A FACE TO FACE EVALUATION WAS PERFORMED  Onesha Krebbs [name] 05/12/2020, 8:26 AM

## 2020-05-12 NOTE — Progress Notes (Signed)
Occupational Therapy Session Note  Patient Details  Name: Tyler Glass MRN: [id number] Date of Birth: 06-14-1934  Today's Date: 05/12/2020 OT Individual Time: 1330-1430 OT Individual Time Calculation (min): 60 min    Short Term Goals: Week 2:  OT Short Term Goal 1 (Week 2): Pt will complete LB dressing with CGA OT Short Term Goal 2 (Week 2): Pt will complete stand pivot transfer with CGA in preperation for higher level toilet transfer. OT Short Term Goal 3 (Week 2): Pt will maintain dynamic standing balance during ADLs with CGA  Skilled Therapeutic Interventions/Progress Updates:    Patient in bed, states that he has mild pain in left lower leg but ready for therapy session.  Supine to sitting edge of bed with CGA.  Sit pivot transfer to/from bed, w/c and mat table with min a.  Completed UB ergometer 2 x 5 minutes, standing dynamic balance and weight shift activities with min A.  Seated UB and LB coordination activities.  Tolerated unsupported sitting dynamic balance and core stability activities with CS - ongoing cues for sequence of multi step tasks.  He returned to bed at close of session, CS for sit to supine and positioning in bed, bed alarm set and call bell in hand.    Therapy Documentation Precautions:  Precautions Precautions: Fall, Other (comment) Precaution Comments: R hemiparesis with strong R lean Restrictions Weight Bearing Restrictions: No  Therapy/Group: Individual Therapy  Barrie Lyme 05/12/2020, 7:44 AM

## 2020-05-12 NOTE — Progress Notes (Signed)
Occupational Therapy Session Note  Patient Details  Name: Tyler Glass MRN: [id number] Date of Birth: 06-19-1934  Today's Date: 05/12/2020 OT Individual Time: 1245-8099 OT Individual Time Calculation (min): 43 min   Short Term Goals: Week 2:  OT Short Term Goal 1 (Week 2): Pt will complete LB dressing with CGA OT Short Term Goal 2 (Week 2): Pt will complete stand pivot transfer with CGA in preperation for higher level toilet transfer. OT Short Term Goal 3 (Week 2): Pt will maintain dynamic standing balance during ADLs with CGA  Skilled Therapeutic Interventions/Progress Updates:    Pt greeted semi-reclined in bed and agreeable to OT treatment session. Pt reported he would like to sponge bathe this morning. Pt completed bed mobility with supervision. Worked on dynamic sitting balance at EOB to don socks with pt having lateral LOB to the R when trying to don R sock, requiring min A to correct. OT educated on achieving balance prior to lifting leg to don sock. Pt completed stand-pivot to wc with min A. Bathing/dressing completed sit<>stand at the sink with overall CGA and CGA A overall for dynamic standing balance. Pt with 1 overt LOB to the R while washing buttocks, requiring mod A to correct. Pt completed oral care with supervision, then pt consumed 8oz of water per water protocol. Pt with no signs of aspiration. Pt needed set-up A to open containers for breakfast. Pt left seated in wc with chair alarm on, call bell in reach, and eating breakfast.   Therapy Documentation Precautions:  Precautions Precautions: Fall, Other (comment) Precaution Comments: R hemiparesis with strong R lean Restrictions Weight Bearing Restrictions: No Pain:  denies pain  Therapy/Group: Individual Therapy  [name] 05/12/2020, 8:11 AM

## 2020-05-12 NOTE — Progress Notes (Signed)
Modified Barium Swallow Progress Note  Patient Details  Name: Tyler Glass MRN: [id number] Date of Birth: 1933/10/17  Today's Date: 05/12/2020  Modified Barium Swallow completed.  Full report located under Chart Review in the Imaging Section.  Brief recommendations include the following:  Clinical Impression  Patient's swallowing function appears much improved since initial MBS on 11/10. Patient demonstrates a mild oropharyngeal dysphagia characterized by prolonged mastication and AP transit with premature spillage to the valleculae and delayed swallow initiation that triggers mostly at the pyriform sinuses with thin liquids and at the velleculae with solids.  However, patient was able to fully protect his airway without any instances of penetration or aspiration observed. Patient also demonstrated very minimal pharyngeal residue with solids and liquids. Recommend patient continued Dys. 3 textures and upgrade to thin liquids. Patient verbalized understanding and agreement.   Swallow Evaluation Recommendations    SLP Diet Recommendations: Dysphagia 3 (Mech soft) solids;Thin liquid   Liquid Administration via: Cup;Straw   Medication Administration: Crushed with puree   Supervision: Intermittent supervision to cue for compensatory strategies;Patient able to self feed   Compensations: Minimize environmental distractions;Slow rate;Small sips/bites   Postural Changes: Seated upright at 90 degrees   Oral Care Recommendations: Oral care BID        Jeremias Broyhill 05/12/2020,10:05 AM

## 2020-05-12 NOTE — Progress Notes (Signed)
Physical Therapy Session Note  Patient Details  Name: Tyler Glass MRN: [id number] Date of Birth: [birth year]  Today's Date: 05/12/2020 PT Individual Time: 1100-1145 PT Individual Time Calculation (min): 45 min   Short Term Goals: Week 2:  PT Short Term Goal 1 (Week 2): Pt will perform sit<>stand transfers with CGA and LRAD PT Short Term Goal 2 (Week 2): Pt will perform bed<>chair transfers with supervision and LRAD PT Short Term Goal 3 (Week 2): Pt will ambulate 110ft with minA and LRAD PT Short Term Goal 4 (Week 2): Pt will initiate stair training  Skilled Therapeutic Interventions/Progress Updates:    Pt received supine in bed, awake and agreeable to PT session, does not report any pain. Supine<>sit with supervision with HOB flat, use of bed rail. Squat<>pivot with CGA from EOB to w/c. Wheeled with totalA for time management from his room to main therapy gym. Focused remainder of session on NMR for gait training with +2 skilled assist from 2nd PT. Squat<>pivot with CGA from w/c to mat table. Sit<>stand with CGA/minA from mat table to no AD. Ambulated ~115ft with +1 light modA with B HHA on level surfaces. Therapist cueing for L weight shift via target for L hip and visual cues for widening BOS due to scissoring gait pattern. Provided visual targets with colored numbers on floor to attempt improved cadence/foot placement, slight improvement, but continued to show moderate R lean/push. After seated rest in w/c, positioned in hallway with L HR support. Performed further gait training with using L wall for targets for L shldr, cued L shldr to wall with initiation of RLE swing. Also placed ~4inch wide balance beam on floor and instructed to straddle to promote widening BOS. Pt was able to ambulate 2x59ft with min/modA with continuous verbal/visual/tactile cueing for gait sequencing. Step-to pattern, robotic with rigid trunk, R foot slap. He does show reduced R lateral lean with this! Will benefit from  further NMR gait training with skilled intervention to promote midline awareness, motor planning, and gait deficits outlined above. Returned to his room where he remained seated in w/c with needs in reach, chair alarm on.  Therapy Documentation Precautions:  Precautions Precautions: Fall, Other (comment) Precaution Comments: R hemiparesis with strong R lean Restrictions Weight Bearing Restrictions: No  Therapy/Group: Individual Therapy  Tito Ausmus P Kelsei Defino PT 05/12/2020, 11:59 AM

## 2020-05-13 ENCOUNTER — Inpatient Hospital Stay (HOSPITAL_COMMUNITY): Payer: Medicare HMO

## 2020-05-13 ENCOUNTER — Inpatient Hospital Stay (HOSPITAL_COMMUNITY): Payer: Medicare HMO | Admitting: Occupational Therapy

## 2020-05-13 LAB — CBC WITH DIFFERENTIAL/PLATELET
Abs Immature Granulocytes: 0.02 10*3/uL (ref 0.00–0.07)
Basophils Absolute: 0 10*3/uL (ref 0.0–0.1)
Basophils Relative: 1 %
Eosinophils Absolute: 0.3 10*3/uL (ref 0.0–0.5)
Eosinophils Relative: 5 %
HCT: 31.6 % — ABNORMAL LOW (ref 39.0–52.0)
Hemoglobin: 10.3 g/dL — ABNORMAL LOW (ref 13.0–17.0)
Immature Granulocytes: 0 %
Lymphocytes Relative: 31 %
Lymphs Abs: 2 10*3/uL (ref 0.7–4.0)
MCH: 29 pg (ref 26.0–34.0)
MCHC: 32.6 g/dL (ref 30.0–36.0)
MCV: 89 fL (ref 80.0–100.0)
Monocytes Absolute: 0.7 10*3/uL (ref 0.1–1.0)
Monocytes Relative: 11 %
Neutro Abs: 3.3 10*3/uL (ref 1.7–7.7)
Neutrophils Relative %: 52 %
Platelets: 342 10*3/uL (ref 150–400)
RBC: 3.55 MIL/uL — ABNORMAL LOW (ref 4.22–5.81)
RDW: 13.1 % (ref 11.5–15.5)
WBC: 6.3 10*3/uL (ref 4.0–10.5)
nRBC: 0 % (ref 0.0–0.2)

## 2020-05-13 MED ORDER — POLYETHYLENE GLYCOL 3350 17 G PO PACK
17.0000 g | PACK | Freq: Two times a day (BID) | ORAL | Status: DC
  Administered 2020-05-13 – 2020-05-25 (×24): 17 g via ORAL
  Filled 2020-05-13 (×24): qty 1

## 2020-05-13 NOTE — Progress Notes (Signed)
Physical Therapy Session Note  Patient Details  Name: Tyler Glass MRN: [id number] Date of Birth: 04-May-1934  Today's Date: 05/13/2020 PT Individual Time: 7628-3151 + 1332-1430 PT Individual Time Calculation (min): 54 min  + 58 min  Short Term Goals: Week 2:  PT Short Term Goal 1 (Week 2): Pt will perform sit<>stand transfers with CGA and LRAD PT Short Term Goal 2 (Week 2): Pt will perform bed<>chair transfers with supervision and LRAD PT Short Term Goal 3 (Week 2): Pt will ambulate 152ft with minA and LRAD PT Short Term Goal 4 (Week 2): Pt will initiate stair training  Skilled Therapeutic Interventions/Progress Updates:    1st session: Pt greeted supine in bed, awake and agreeable to PT session, no reports of pain. He was using urinal in bed, continent of bladder, charted in flowsheets. Supine<>sit with supervision with use of bed features and performed squat<>pivot transfer with CGA from EOB to w/c. Pt requesting to brush teeth, therefore, wheeled sinkside where he completed this with setupA. Wheeled in chair with totalA for energy conservation from his room to main hallway. Focused remainder of session on functional gait training. He ambulated forward/backward 74ft with minA and L HR support + 2x54ft with minA and L HR support! He shows improved awareness of midline and improved ability to demonstrate L lateral weight shift. Cues provided for widening BOS, L shldr to wall to reduce R lean, and cadence. He propelled himself with supervision in w/c using BUE's only on level surfaces ~140ft with purposes of general strengthening. He ended session seated in chair with chair alarm on, needs in reach, pt pleased with progress.  2nd session: Pt greeted seated in w/c, daughter at bedside, pt agreeable to PT session without reports of pain. Discussion held with daughter regarding pt's current mobility status, barriers to DC, and extending his DC date to 12/8. She reports 24/7 S/A will be available with  both her and her mother. She reports 3-4STE with B HR's and 6 + 6 steps with B HR's as well with 1/2 bath on main floor. Daughter appears very supportive and willing to assist as needed. W/c transport for time management from his room to main therapy hallway. Focus of session to continue functional gait training. He ambulated 4x70 ft with min/modA and L HR support. He also ambulated 4ft with mod/maxA while using R HR support, requiring increased assist due to worsening R pushing. Trial of gait with RW where he ambulated 2x25ft + 146ft with min/modA and RW. Increasing pushing/unsteadiness with turns ,espeically to the L. Pt returned to his room where he remained seated in w/c with daughter at bedside, needs in reach, bed alarm on.   Therapy Documentation Precautions:  Precautions Precautions: Fall, Other (comment) Precaution Comments: R hemiparesis with strong R lean Restrictions Weight Bearing Restrictions: No  Therapy/Group: Individual Therapy  Tito Ausmus P Mazikeen Hehn PT 05/13/2020, 7:36 AM

## 2020-05-13 NOTE — Progress Notes (Signed)
Occupational Therapy Session Note  Patient Details  Name: Tyler Glass MRN: [id number] Date of Birth: [birth year]  Today's Date: 05/13/2020 OT Individual Time: 1132-1202 OT Individual Time Calculation (min): 30 min    Short Term Goals: Week 2:  OT Short Term Goal 1 (Week 2): Pt will complete LB dressing with CGA OT Short Term Goal 2 (Week 2): Pt will complete stand pivot transfer with CGA in preperation for higher level toilet transfer. OT Short Term Goal 3 (Week 2): Pt will maintain dynamic standing balance during ADLs with CGA  Skilled Therapeutic Interventions/Progress Updates:    Pt sitting up in w/c, no c/o pain, agreeable to OT session.  Pt requesting to go to the bathroom.  Pt self propelled to bathroom with supervision.  Stand pivot using grab bar with CGA.  Pt completed toileting tasks with CGA.  Pt had continent episode of bowel.  Stand pivot toilet to w/c with CGA.  Pt propelled self to sink and completed UB bathing and dressing with setup.  Pt completed LB bathing and dressing with CGA for sit<>stand and standing portion of task. Noted pt self initiating using mirror for visual feedback to self correct posture to midline. Pt in w/c, call bell in reach, seat pad alarm on.  Therapy Documentation Precautions:  Precautions Precautions: Fall, Other (comment) Precaution Comments: R hemiparesis with strong R lean Restrictions Weight Bearing Restrictions: No   Therapy/Group: Individual Therapy  Amie Critchley 05/13/2020, 12:43 PM

## 2020-05-13 NOTE — Progress Notes (Signed)
Patient ID: Tyler Glass, male   DOB: [birth year], 86 y.o.   MRN: [id number]  Met with pt and spoke with daughter via telephone to discuss team conference progress toward his goals and therapy team wanting him to stay until 12/8 to reach goals. Both pleased with his progress and hopeful this will continue. Daughter has been in to visit and will come in to do hands on when therapy feels ready for her to do this.

## 2020-05-13 NOTE — Progress Notes (Addendum)
[facility] PHYSICAL MEDICINE & REHABILITATION PROGRESS NOTE   Subjective/Complaints: Patient seen sitting on bed this morning.  He states he slept well overnight.  He is questions regarding the results of Dopplers U/S.   ROS: Denies CP, SOB, N/V/D  Objective:   MR PELVIS WO CONTRAST  Result Date: 05/12/2020 CLINICAL DATA:  Left lower quadrant abdominal pain. Possible hernia. EXAM: MRI PELVIS WITHOUT CONTRAST TECHNIQUE: Multiplanar multisequence MR imaging of the pelvis was performed. No intravenous contrast was administered. COMPARISON:  None. FINDINGS: Examination limited by patient motion. Patient would not or could not hold still for the examination. Urinary Tract: The bladder is unremarkable. No bladder mass, calculi or asymmetric bladder wall thickening. Mild uniform bladder wall thickening could be due to lack of distension or possibly partial bladder outlet obstruction. Bowel: Large amount of stool in the rectum could suggest fecal impaction. No rectal mass. The visualized sigmoid colon is grossly normal and the visualized small-bowel loops are grossly normal. Vascular/Lymphatic: No aneurysm or lymphadenopathy. Reproductive: The prostate gland is within normal limits in size for age. No lesions are identified. The seminal vesicles appear normal. Other:  No inguinal mass, adenopathy or inguinal hernia. Musculoskeletal: Edema like signal changes in the left iliopsoas muscle along with tendinopathy and involving the iliopsoas tendon. Some surrounding fluid is also noted. Both hips are normally located. No significant degenerative changes, stress fracture or AVN. The pubic symphysis and SI joints are intact. No pelvic fractures or bone lesions. IMPRESSION: 1. Left iliopsoas muscle edema suggesting a muscle strain or partial tear. There is also tendinopathy involving the iliopsoas tendon but no complete tear/rupture. 2. Large amount of stool in the rectum could suggest fecal impaction. 3. No pelvic  fractures or bone lesions. 4. No inguinal mass or hernia. Electronically Signed   By: Rudie Meyer M.D.   On: 05/12/2020 07:43   DG Swallowing Func-Speech Pathology  Result Date: 05/12/2020 Objective Swallowing Evaluation: Type of Study: MBS-Modified Barium Swallow Study  Patient Details Name: Tyler Glass MRN: [id number] Date of Birth: [birth year] Today's Date: 05/12/2020 Past Medical History: Past Medical History: Diagnosis Date . Hypertension  Past Surgical History: No past surgical history on file. HPI: See H&P  Subjective: alert, with spouse at bedside Assessment / Plan / Recommendation CHL IP CLINICAL IMPRESSIONS 05/12/2020 Clinical Impression Patient's swallowing function appears much improved since initial MBS on 11/10. Patient demonstrates a mild oropharyngeal dysphagia characterized by prolonged mastication and AP transit with premature spillage to the valleculae and delayed swallow initiation that triggers mostly at the pyriform sinuses with thin liquids and at the velleculae with solids.  However, patient was able to fully protect his airway without any instances of penetration or aspiration observed. Patient also demonstrated very minimal pharyngeal residue with solids and liquids. Recommend patient continued Dys. 3 textures and upgrade to thin liquids. Patient verbalized understanding and agreement. SLP Visit Diagnosis Dysphagia, oropharyngeal phase (R13.12) Attention and concentration deficit following -- Frontal lobe and executive function deficit following -- Impact on safety and function Mild aspiration risk   CHL IP TREATMENT RECOMMENDATION 05/12/2020 Treatment Recommendations Therapy as outlined in treatment plan below   Prognosis 05/12/2020 Prognosis for Safe Diet Advancement Good Barriers to Reach Goals -- Barriers/Prognosis Comment -- CHL IP DIET RECOMMENDATION 05/12/2020 SLP Diet Recommendations Dysphagia 3 (Mech soft) solids;Thin liquid Liquid Administration via Cup;Straw Medication  Administration Crushed with puree Compensations Minimize environmental distractions;Slow rate;Small sips/bites Postural Changes Seated upright at 90 degrees   CHL IP OTHER RECOMMENDATIONS 05/12/2020 Recommended Consults --  Oral Care Recommendations Oral care BID Other Recommendations --   CHL IP FOLLOW UP RECOMMENDATIONS 05/12/2020 Follow up Recommendations Inpatient Rehab   CHL IP FREQUENCY AND DURATION 05/12/2020 Speech Therapy Frequency (ACUTE ONLY) min 3x week Treatment Duration 2 weeks      CHL IP ORAL PHASE 05/12/2020 Oral Phase Impaired Oral - Pudding Teaspoon -- Oral - Pudding Cup -- Oral - Honey Teaspoon -- Oral - Honey Cup -- Oral - Nectar Teaspoon -- Oral - Nectar Cup -- Oral - Nectar Straw -- Oral - Thin Teaspoon Premature spillage;Delayed oral transit Oral - Thin Cup Delayed oral transit;Premature spillage Oral - Thin Straw Premature spillage;Delayed oral transit Oral - Puree Delayed oral transit Oral - Mech Soft -- Oral - Regular Delayed oral transit;Impaired mastication Oral - Multi-Consistency -- Oral - Pill -- Oral Phase - Comment --  CHL IP PHARYNGEAL PHASE 05/12/2020 Pharyngeal Phase Impaired Pharyngeal- Pudding Teaspoon -- Pharyngeal -- Pharyngeal- Pudding Cup -- Pharyngeal -- Pharyngeal- Honey Teaspoon -- Pharyngeal -- Pharyngeal- Honey Cup -- Pharyngeal -- Pharyngeal- Nectar Teaspoon -- Pharyngeal -- Pharyngeal- Nectar Cup -- Pharyngeal -- Pharyngeal- Nectar Straw NT Pharyngeal -- Pharyngeal- Thin Teaspoon Delayed swallow initiation-pyriform sinuses Pharyngeal -- Pharyngeal- Thin Cup Delayed swallow initiation-pyriform sinuses Pharyngeal Material does not enter airway Pharyngeal- Thin Straw Delayed swallow initiation-pyriform sinuses Pharyngeal Material does not enter airway Pharyngeal- Puree Delayed swallow initiation-vallecula Pharyngeal -- Pharyngeal- Mechanical Soft -- Pharyngeal -- Pharyngeal- Regular Delayed swallow initiation-vallecula Pharyngeal -- Pharyngeal- Multi-consistency --  Pharyngeal -- Pharyngeal- Pill -- Pharyngeal -- Pharyngeal Comment --  CHL IP CERVICAL ESOPHAGEAL PHASE 05/12/2020 Cervical Esophageal Phase WFL Pudding Teaspoon -- Pudding Cup -- Honey Teaspoon -- Honey Cup -- Nectar Teaspoon -- Nectar Cup -- Nectar Straw -- Thin Teaspoon -- Thin Cup -- Thin Straw -- Puree -- Mechanical Soft -- Regular -- Multi-consistency -- Pill -- Cervical Esophageal Comment -- PAYNE, COURTNEY 05/12/2020, 10:08 AM          Feliberto Gottron, MA, CCC-SLP [phone]     Recent Labs    05/13/20 0435  WBC 6.3  HGB 10.3*  HCT 31.6*  PLT 342   Recent Labs    05/11/20 0917  NA 140  K 3.6  CL 103  CO2 27  GLUCOSE 112*  BUN 15  CREATININE 1.29*  CALCIUM 9.3    Intake/Output Summary (Last 24 hours) at 05/13/2020 0830 Last data filed at 05/13/2020 0733 Gross per 24 hour  Intake 860 ml  Output 900 ml  Net -40 ml        Physical Exam: Vital Signs Blood pressure 103/68, pulse 99, temperature 98.3 F (36.8 C), temperature source Oral, resp. rate 20, height 6\' 1"  (1.854 m), weight 76 kg, SpO2 97 %.  Constitutional: No distress . Vital signs reviewed. HENT: Normocephalic.  Atraumatic. Eyes: EOMI. No discharge. Cardiovascular: No JVD.  RRR. Respiratory: Normal effort.  No stridor.  Bilateral clear to auscultation. GI: Non-distended.  BS +. Skin: Warm and dry.  Intact. Psych: Normal mood.  Normal behavior. Musc: No edema in extremities.  No tenderness in extremities. Neuro:  Moderate dysarthria, unchanged Motor: 5/5 throughout, unchanged Mild facial weakness   Assessment/Plan: 1. Functional deficits which require 3+ hours per day of interdisciplinary therapy in a comprehensive inpatient rehab setting.  Physiatrist is providing close team supervision and 24 hour management of active medical problems listed below.  Physiatrist and rehab team continue to assess barriers to discharge/monitor patient progress toward functional and medical goals  Care  Tool:  Bathing  Body parts bathed by patient: Right arm, Left arm, Chest, Abdomen, Front perineal area, Right upper leg, Left upper leg, Face, Buttocks, Left lower leg, Right lower leg   Body parts bathed by helper: Buttocks Body parts n/a: Left lower leg, Right lower leg   Bathing assist Assist Level: Contact Guard/Touching assist     Upper Body Dressing/Undressing Upper body dressing   What is the patient wearing?: Pull over shirt    Upper body assist Assist Level: Set up assist    Lower Body Dressing/Undressing Lower body dressing      What is the patient wearing?: Incontinence brief, Pants     Lower body assist Assist for lower body dressing: Contact Guard/Touching assist     Toileting Toileting    Toileting assist Assist for toileting: Minimal Assistance - Patient > 75% Assistive Device Comment: urinal   Transfers Chair/bed transfer  Transfers assist     Chair/bed transfer assist level: Minimal Assistance - Patient > 75%     Locomotion Ambulation   Ambulation assist      Assist level: Maximal Assistance - Patient 25 - 49% Assistive device: No Device Max distance: 78ft   Walk 10 feet activity   Assist  Walk 10 feet activity did not occur: N/A  Assist level: Maximal Assistance - Patient 25 - 49% Assistive device: No Device   Walk 50 feet activity   Assist Walk 50 feet with 2 turns activity did not occur: Safety/medical concerns         Walk 150 feet activity   Assist Walk 150 feet activity did not occur: N/A         Walk 10 feet on uneven surface  activity   Assist Walk 10 feet on uneven surfaces activity did not occur: N/A         Wheelchair     Assist Will patient use wheelchair at discharge?:  (TBD)             Wheelchair 50 feet with 2 turns activity    Assist        Assist Level: Total Assistance - Patient < 25%   Wheelchair 150 feet activity     Assist          Medical Problem  List and Plan: 1.  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 Please see conference note from today as well.  2.  Antithrombotics: -DVT/anticoagulation:   Lovenox changed to Eliquis  Doppler showing posterior tib/peroneal and gastroc DVTs left lower extremity.             -antiplatelet therapy: Aspirin 81 mg daily, Plavix 75 mg daily DC'd due to initiation of Eliquis, transition to ASA after 3 to 6 months per neurology 3. Pain Management: Tylenol as needed  Robaxin as needed  Controlled with meds on 11/24 4. Mood: Provide emotional support             -antipsychotic agents: N/A 5. Neuropsych: This patient is not fully capable of making decisions on his own behalf. 6. Skin/Wound Care: Routine skin checks 7. Fluids/Electrolytes/Nutrition: Routine in and outs 8.    Post stroke dysphagia.  Dysphagia #3 thins liquids.             Advance diet as tolerated 9. Essential hypertension.  Patient on Norvasc 2.5 mg daily and Zestoretic  10-12.5 mg daily prior to admission.  Resume as needed   Vitals:   05/12/20 1934 05/13/20 0422  BP: 122/78 103/68  Pulse: 96 99  Resp: 16 20  Temp: 98.5 F (36.9 C) 98.3 F (36.8 C)  SpO2: 100% 97%   Controlled on 11/24 10.  Hyperlipidemia: Lipitor 11. Hiccups- Resolved  Continue baclofen 10 mg TID, decreased to 5 3 times daily on 11/17, d/ced on 11/18 12. Constipation  Bowel meds increased on 11/23, increased again 11/24 13.  Hypokalemia  Potassium 3.6 on 11/22, plan for repeat labs later this week  Supplemented x1 day 14.  Transaminitis  ALT remains elevated, but improving on 11/19 15.  Hypoalbuminemia  Supplement initiated on 11/15 16.?  CKD stage IIIa  GFR 55 on 11/22, plan order labs for the end of the week  Encourage fluids  Continue  to monitor 17.  Acute blood loss anemia  Hemoglobin 10.3 on 11/24, will plan to repeat labs later this week  Continue to monitor 18.  Left iliopsoas strain  MRI suggesting muscle strain versus partial tear with tendinopathy  See #3  LOS: 12 days A FACE TO FACE EVALUATION WAS PERFORMED  Skyelar Swigart Karis Jubanil Avenir Lozinski 05/13/2020, 8:30 AM

## 2020-05-13 NOTE — Patient Care Conference (Signed)
Inpatient RehabilitationTeam Conference and Plan of Care Update Date: 05/13/2020   Time: 11:04 AM    Patient Name: Tyler Glass      Medical Record Number: [id number]  Date of Birth: 03/17/1934 Sex: Male         Room/Bed: 4M07C/4M07C-01 Payor Info: Payor: [employer] MEDICARE / Plan: [employer] MEDICARE HMO / Product Type: *No Product type* /    Admit Date/Time:  05/01/2020  5:09 PM  Primary Diagnosis:  CVA (cerebral vascular accident) [facility])  Hospital Problems: Principal Problem:   CVA (cerebral vascular accident) (HCC) Active Problems:   Acute blood loss anemia   Hypoalbuminemia due to protein-calorie malnutrition (HCC)   Hypokalemia   Essential hypertension   Transaminitis   Hiccups   AKI (acute kidney injury) (HCC)   Stage 3 chronic kidney disease (HCC)   Acute deep vein thrombosis (DVT) of tibial vein of left lower extremity (HCC)   Strain of left iliopsoas muscle    Expected Discharge Date: Expected Discharge Date: 05/27/20  Team Members Present: Physician leading conference: Dr. Maryla Morrow Care Coodinator Present: Chana Bode, RN, BSN, CRRN;Becky Dupree, LCSW Nurse Present: Margot Ables, LPN PT Present: Wynelle Link, PT OT Present: Dolphus Jenny, OT SLP Present: Elio Forget, SLP PPS Coordinator present : Fae Pippin, SLP     Current Status/Progress Goal Weekly Team Focus  Bowel/Bladder   Continent AB/B [facility] 05/11/20  Maintain continence and continue bowel regimen  assess and toilet as needed   Swallow/Nutrition/ Hydration   MBS on 11/23: upgraded to Dys 3 thin liquids  Mod I  swallow strategies with upgraded diet   ADL's   min/CGA functional transfers, supervision/setup UB ADLs, min/CGA LB ADLs  CGA  standing balance, functional transfers, self care training, midline orientation   Mobility   supervision bed mobility, CGA squat<>pivot, gait modA +2 with HHA, STRONG R pushing  CGA  midline orientation, gait training on LiteGait, dynamic standing balance,  Begin stair training?   Communication   supervision to mod I  Mod I      Safety/Cognition/ Behavioral Observations  modA mild complex  supervision to minA  downgraded intensity to basic to mildly complex cognitive goals   Pain   denies pain         Skin   Skin intact           Discharge Planning:  Has missed therapy sessions due to medical issues-does he need to be extended due to this. Home with daughter working out 24/7 care   Team Discussion: Steady progress noted however progress impaired by mid line awareness issues, pushing,etc. Patient on target to meet rehab goals: no, goals downgraded for bathing and dressing secondary to baseline cognitive issues  *See Care Plan and progress notes for long and short-term goals.   Revisions to Treatment Plan:  MBS pending; possible advancement to thin liquids with dysphagia 3 diet; currently trials with regular solids Teaching Needs: Transfers, toileting, medications, etc.   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  Barriers to Discharge: Medical stability;Other (comments)  Barriers to Discharge Comments: post stroke dysphagia Possible Resolutions to Barriers/Weekly Focus: Therapies, follow labs - Cr, K+, follow BPs, anticoag for DVT   Continued Need for Acute Rehabilitation Level of Care: The patient requires daily medical management by a physician with specialized training in physical medicine and rehabilitation for the following  reasons: Direction of a multidisciplinary physical rehabilitation program to maximize functional independence : Yes Medical management of patient stability for increased activity during participation in an intensive rehabilitation regime.: Yes Analysis of laboratory values and/or radiology reports  with any subsequent need for medication adjustment and/or medical intervention. : Yes   I attest that I was present, lead the team conference, and concur with the assessment and plan of the team.   Chana Bode B 05/13/2020, 12:53 PM

## 2020-05-13 NOTE — Progress Notes (Addendum)
Speech Language Pathology Daily Session Note  Patient Details  Name: Tyler Glass MRN: [id number] Date of Birth: 1933-09-13  Today's Date: 05/13/2020 SLP Individual Time: 1502-1600 SLP Individual Time Calculation (min): 58 min  Short Term Goals: Week 2: SLP Short Term Goal 1 (Week 2): Patient will consume current diet of Dys. 3 textures with thin liquids with minimal overt s/s of aspiration and overall Mod I with use of swallowing compensatory strategies. SLP Short Term Goal 1 - Progress (Week 2): Updated due to goal met SLP Short Term Goal 2 (Week 2): Pt will consume current diet with mod I use of swallow strategies. SLP Short Term Goal 3 (Week 2): Pt will complete pharyngeal strength exercises with supervision A verbal cues for accuracy. SLP Short Term Goal 4 (Week 2): Pt will complete basic-mildly complex problem solving tasks with min A verbal cues. SLP Short Term Goal 5 (Week 2): Pt will utilize internal and external aids to facilitate recall of daily information with min assist multimodal cues. SLP Short Term Goal 6 (Week 2): Patient will demonstrate efficient mastication and complete oral clearance with trials of regular textures over 2 sessions prior to upgrade.  Skilled Therapeutic Interventions: Pt seen for skilled ST targeting cognition and dysphagia goals, daughter present for initial portion of tx. Pt demonstrates independent recall of pharyngeal strengthening exercises, completing Masako x10 and CTAR x15 provided min A verbal cues for accuracy. Pt apprehensive about regular trials this date, states he's scared it will be too dry. Pt agreeable to trial regular chicken breast and dry cracker. Single bite chicken with prolonged mastication and inability to swallow without liquid assistance, unclear if this was due to difficulty with bolus prep and transit or due to anxiety with swallow. Pt consuming 3 graham crackers with 1 episode repetitive throat clear requiring extensive liquid  assistance to clear. Pt consuming ~10 oz thin liquid via straw with no change in vocal quality or s/s aspiration. Pt to remain Dys 3 diet/thin liquids per patient preference at this time. Simple problem ID/problem solving task related to discharge environment (daughter's house) completed with mod A verbal cues for 75% accuracy. Pt demonstrates good awareness/impact of physical deficits, however would benefit from continued education on how cognitive impairments will impact safety at discharge. Pt left in chair with all needs within reach. Cont ST POC.   Pain Pain Assessment Pain Scale: 0-10 Pain Score: 0-No pain  Therapy/Group: Individual Therapy  Dewaine Conger 05/13/2020, 3:52 PM

## 2020-05-14 NOTE — Progress Notes (Signed)
[place] PHYSICAL MEDICINE & REHABILITATION PROGRESS NOTE   Subjective/Complaints: Pain sitting up in bed this morning.  He states he slept well overnight.  He denies complaints.  ROS: Denies CP, SOB, N/V/D  Objective:   No results found. Recent Labs    05/13/20 0435  WBC 6.3  HGB 10.3*  HCT 31.6*  PLT 342   No results for input(s): NA, K, CL, CO2, GLUCOSE, BUN, CREATININE, CALCIUM in the last 72 hours.  Intake/Output Summary (Last 24 hours) at 05/14/2020 1236 Last data filed at 05/14/2020 0800 Gross per 24 hour  Intake 1200 ml  Output 1200 ml  Net 0 ml        Physical Exam: Vital Signs Blood pressure 96/72, pulse 93, temperature 98.8 F (37.1 C), resp. rate 20, height 6\' 1"  (1.854 m), weight 76 kg, SpO2 96 %.  Constitutional: No distress . Vital signs reviewed. HENT: Normocephalic.  Atraumatic. Eyes: EOMI. No discharge. Cardiovascular: No JVD.  RRR. Respiratory: Normal effort.  No stridor.  Bilateral clear to auscultation. GI: Non-distended.  BS +. Skin: Warm and dry.  Intact. Psych: Normal mood.  Normal behavior. Musc: No edema in extremities.  No tenderness in extremities. Neuro: Alert Moderate dysarthria, stable Motor: 5/5 throughout, unchanged Mild facial weakness   Assessment/Plan: 1. Functional deficits which require 3+ hours per day of interdisciplinary therapy in a comprehensive inpatient rehab setting.  Physiatrist is providing close team supervision and 24 hour management of active medical problems listed below.  Physiatrist and rehab team continue to assess barriers to discharge/monitor patient progress toward functional and medical goals  Care Tool:  Bathing    Body parts bathed by patient: Right arm, Left arm, Chest, Abdomen, Front perineal area, Right upper leg, Left upper leg, Face, Buttocks, Left lower leg, Right lower leg   Body parts bathed by helper: Buttocks Body parts n/a: Left lower leg, Right lower leg   Bathing assist Assist  Level: Contact Guard/Touching assist     Upper Body Dressing/Undressing Upper body dressing   What is the patient wearing?: Pull over shirt    Upper body assist Assist Level: Set up assist    Lower Body Dressing/Undressing Lower body dressing      What is the patient wearing?: Incontinence brief, Pants     Lower body assist Assist for lower body dressing: Contact Guard/Touching assist     Toileting Toileting    Toileting assist Assist for toileting: Contact Guard/Touching assist Assistive Device Comment: urinal   Transfers Chair/bed transfer  Transfers assist     Chair/bed transfer assist level: Minimal Assistance - Patient > 75%     Locomotion Ambulation   Ambulation assist      Assist level: Maximal Assistance - Patient 25 - 49% Assistive device: No Device Max distance: 5ft   Walk 10 feet activity   Assist  Walk 10 feet activity did not occur: N/A  Assist level: Maximal Assistance - Patient 25 - 49% Assistive device: No Device   Walk 50 feet activity   Assist Walk 50 feet with 2 turns activity did not occur: Safety/medical concerns         Walk 150 feet activity   Assist Walk 150 feet activity did not occur: N/A         Walk 10 feet on uneven surface  activity   Assist Walk 10 feet on uneven surfaces activity did not occur: N/A         Wheelchair     Assist Will patient  use wheelchair at discharge?:  (TBD)             Wheelchair 50 feet with 2 turns activity    Assist        Assist Level: Total Assistance - Patient < 25%   Wheelchair 150 feet activity     Assist          Medical Problem List and Plan: 1.  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  Antithrombotics: -DVT/anticoagulation:   Lovenox changed to Eliquis  Doppler showing posterior tib/peroneal and gastroc DVTs left lower extremity.             -antiplatelet therapy: Aspirin 81 mg daily,  Plavix 75 mg daily DC'd due to initiation of Eliquis, transition to ASA after 3 to 6 months per neurology 3. Pain Management: Tylenol as needed  Robaxin as needed  Controlled with meds on 11/25 4. Mood: Provide emotional support             -antipsychotic agents: N/A 5. Neuropsych: This patient is not fully capable of making decisions on his own behalf. 6. Skin/Wound Care: Routine skin checks 7. Fluids/Electrolytes/Nutrition: Routine in and outs 8.    Post stroke dysphagia.  Dysphagia #3 thin liquids.             Advance diet as tolerated 9. Essential hypertension.  Patient on Norvasc 2.5 mg daily and Zestoretic 10-12.5 mg daily prior to admission.  Resume as needed   Vitals:   05/13/20 1934 05/14/20 0430  BP: 126/76 96/72  Pulse: 89 93  Resp: 20 20  Temp: 98.5 F (36.9 C) 98.8 F (37.1 C)  SpO2: 99% 96%   Controlled/soft on 11/25 10.  Hyperlipidemia: Lipitor 11. Hiccups- Resolved  Continue baclofen 10 mg TID, decreased to 5 3 times daily on 11/17, d/ced on 11/18 12. Constipation  Bowel meds increased on 11/23, increased again 11/24  Improving 13.  Hypokalemia  Potassium 3.6 on 11/22, labs ordered for tomorrow  Supplemented x1 day 14.  Transaminitis  ALT remains elevated, but improving on 11/19 15.  Hypoalbuminemia  Supplement initiated on 11/15 16.?  CKD stage IIIa  GFR 55 on 11/22, labs ordered for tomorrow  Encourage fluids  Continue to monitor 17.  Acute blood loss anemia  Hemoglobin 10.3 on 11/24, labs ordered for tomorrow  Continue to monitor 18.  Left iliopsoas strain  MRI suggesting muscle strain versus partial tear with tendinopathy  See #3  LOS: 13 days A FACE TO FACE EVALUATION WAS PERFORMED  [place] [name] 05/14/2020, 12:36 PM

## 2020-05-15 ENCOUNTER — Inpatient Hospital Stay (HOSPITAL_COMMUNITY): Payer: Medicare HMO | Admitting: Speech Pathology

## 2020-05-15 ENCOUNTER — Inpatient Hospital Stay (HOSPITAL_COMMUNITY): Payer: Medicare HMO

## 2020-05-15 ENCOUNTER — Inpatient Hospital Stay (HOSPITAL_COMMUNITY): Payer: Medicare HMO | Admitting: Occupational Therapy

## 2020-05-15 LAB — BASIC METABOLIC PANEL
Anion gap: 10 (ref 5–15)
BUN: 18 mg/dL (ref 8–23)
CO2: 29 mmol/L (ref 22–32)
Calcium: 9.4 mg/dL (ref 8.9–10.3)
Chloride: 101 mmol/L (ref 98–111)
Creatinine, Ser: 1.36 mg/dL — ABNORMAL HIGH (ref 0.61–1.24)
GFR, Estimated: 51 mL/min — ABNORMAL LOW (ref >60)
Glucose, Bld: 96 mg/dL (ref 70–99)
Potassium: 4 mmol/L (ref 3.5–5.1)
Sodium: 140 mmol/L (ref 135–145)

## 2020-05-15 LAB — CBC WITH DIFFERENTIAL/PLATELET
Abs Immature Granulocytes: 0.01 10*3/uL (ref 0.00–0.07)
Basophils Absolute: 0 10*3/uL (ref 0.0–0.1)
Basophils Relative: 1 %
Eosinophils Absolute: 0.4 10*3/uL (ref 0.0–0.5)
Eosinophils Relative: 7 %
HCT: 31.6 % — ABNORMAL LOW (ref 39.0–52.0)
Hemoglobin: 10.3 g/dL — ABNORMAL LOW (ref 13.0–17.0)
Immature Granulocytes: 0 %
Lymphocytes Relative: 37 %
Lymphs Abs: 2 10*3/uL (ref 0.7–4.0)
MCH: 29.5 pg (ref 26.0–34.0)
MCHC: 32.6 g/dL (ref 30.0–36.0)
MCV: 90.5 fL (ref 80.0–100.0)
Monocytes Absolute: 0.5 10*3/uL (ref 0.1–1.0)
Monocytes Relative: 9 %
Neutro Abs: 2.4 10*3/uL (ref 1.7–7.7)
Neutrophils Relative %: 46 %
Platelets: 372 10*3/uL (ref 150–400)
RBC: 3.49 MIL/uL — ABNORMAL LOW (ref 4.22–5.81)
RDW: 13.2 % (ref 11.5–15.5)
WBC: 5.3 10*3/uL (ref 4.0–10.5)
nRBC: 0 % (ref 0.0–0.2)

## 2020-05-15 NOTE — Progress Notes (Signed)
Speech Language Pathology Daily Session Note  Patient Details  Name: Tyler Glass MRN: [id number] Date of Birth: 08/10/1933  Today's Date: 05/15/2020 SLP Individual Time: 0800-0900 SLP Individual Time Calculation (min): 60 min  Short Term Goals: Week 2: SLP Short Term Goal 1 (Week 2): Patient will consume current diet of Dys. 3 textures with thin liquids with minimal overt s/s of aspiration and overall Mod I with use of swallowing compensatory strategies. SLP Short Term Goal 1 - Progress (Week 2): Updated due to goal met SLP Short Term Goal 2 (Week 2): Pt will consume current diet with mod I use of swallow strategies. SLP Short Term Goal 3 (Week 2): Pt will complete pharyngeal strength exercises with supervision A verbal cues for accuracy. SLP Short Term Goal 4 (Week 2): Pt will complete basic-mildly complex problem solving tasks with min A verbal cues. SLP Short Term Goal 5 (Week 2): Pt will utilize internal and external aids to facilitate recall of daily information with min assist multimodal cues. SLP Short Term Goal 6 (Week 2): Patient will demonstrate efficient mastication and complete oral clearance with trials of regular textures over 2 sessions prior to upgrade.  Skilled Therapeutic Interventions:   Patient seen for skilled ST session focusing on cognitive function goals. When SLP entered room, patient was finishing up working on swallow exercise learned in previous sessions. (chin tuck against resistance). He was able to recall and demonstrate the other swallow exercises with very minimal verbal cues. Patient required minA cues for anticipatory awareness to his needs when he discharges to his daughter's home. In some respects, he seems to limit himself due to fear or not being confident. He is apprehensive about trying regular solids because they might be "too dry". Patient also educated on likely not needing to use oral suction at this point, as well as its drying effects on oral mucosa.  He stated that he uses it to suck out saliva and/or food. SLP encouraged patient to perform oral care and to try to limit use of oral suction. Patient continues to benefit from skilled SLP intervention to maximize cognitive and swallow function prior to discharge.  Pain Pain Assessment Pain Scale: 0-10 Pain Score: 0-No pain  Therapy/Group: Individual Therapy   T. , MA, CCC-SLP Speech Therapy  

## 2020-05-15 NOTE — Progress Notes (Signed)
Occupational Therapy Weekly Progress Note  Patient Details  Name: Tyler Glass MRN: [id number] Date of Birth: Feb 08, 1934  Beginning of progress report period: May 08, 2020 End of progress report period: May 15, 2020  Today's Date: 05/15/2020 OT Individual Time: 1450-1543 OT Individual Time Calculation (min): 53 min    Patient has met 1 of 3 short term goals.  Pt progressing steadily towards increased independence with self care and functional mobility.  Pts midline awareness is improving steadily as well, progressing to needing only CGA with occasional min assist to maintain static and dynamic sitting and standing balance from needing mod assist prior.  Pt does show deficits in short term memory which acts as a barrier to carryover of skills at times, however overall progress seems steady despite this.  Pt able to complete basic self care with setup for UB and CGA with occasional min assist for LB self care.  Pt will have supportive daughter at discharge to assist as needed.    Patient continues to demonstrate the following deficits: decreased motor planning, decreased midline orientation, decreased memory and delayed processing and decreased sitting balance, decreased standing balance and decreased balance strategies and therefore will continue to benefit from skilled OT intervention to enhance overall performance with BADL.  Patient progressing toward long term goals..  Continue plan of care.  OT Short Term Goals Week 2:  OT Short Term Goal 1 (Week 2): Pt will complete LB dressing with CGA OT Short Term Goal 1 - Progress (Week 2): Met OT Short Term Goal 2 (Week 2): Pt will complete stand pivot transfer with CGA in preperation for higher level toilet transfer. OT Short Term Goal 2 - Progress (Week 2): Progressing toward goal OT Short Term Goal 3 (Week 2): Pt will maintain dynamic standing balance during ADLs with CGA OT Short Term Goal 3 - Progress (Week 2): Progressing toward  goal Week 3:  OT Short Term Goal 1 (Week 3): STGs=LTGs due to ELOS  Skilled Therapeutic Interventions/Progress Updates:    Pt supine in bed, reporting fatigue, no c/o pain, but agreeable to OT session.  Pt completed supine to sit with supervision.  Stand pivot with CGA EOB to w/c using RW.  Pt transported to ortho gym via w/c to participate in dynamic standing with cognitive component activity using BITS program.  Pt required min VCs to initiate safe hand placement during sit<>stand transfers.  Pt completed 2 minute interval dynamic standing with RW present however pt instructed to stand without BUE support.  Pt able to complete without LOB noted, only intermittent VCs to correct mild right pushing.  Pt had difficulty following instruction to recall 2-4 word sequencing with picture to follow needing max VCs for assist.  Pt transported to room and completed in room ambulation at [facility] requiring min assist and min VCs for safe RW mgt and correction of right sided pushing.  Delayed motor planning noted RLE during gait needing VCs for sequencing of step pattern.  Stand to sit with CGA and sit to supine with supervision.  Call bell in reach, bed alarm on.  Therapy Documentation Precautions:  Precautions Precautions: Fall, Other (comment) Precaution Comments: R hemiparesis with strong R lean Restrictions Weight Bearing Restrictions: No   Therapy/Group: Individual Therapy  Ezekiel Slocumb 05/15/2020, 10:36 AM

## 2020-05-15 NOTE — Progress Notes (Signed)
[place] PHYSICAL MEDICINE & REHABILITATION PROGRESS NOTE   Subjective/Complaints: Patient seen sitting up in bed this AM he states he slept well overnight.  He denies complaints.  ROS: Denies CP, SOB, N/V/D  Objective:   No results found. Recent Labs    05/13/20 0435 05/15/20 0433  WBC 6.3 5.3  HGB 10.3* 10.3*  HCT 31.6* 31.6*  PLT 342 372   Recent Labs    05/15/20 0433  NA 140  K 4.0  CL 101  CO2 29  GLUCOSE 96  BUN 18  CREATININE 1.36*  CALCIUM 9.4    Intake/Output Summary (Last 24 hours) at 05/15/2020 1645 Last data filed at 05/15/2020 1413 Gross per 24 hour  Intake 940 ml  Output 2250 ml  Net -1310 ml        Physical Exam: Vital Signs Blood pressure 116/77, pulse 96, temperature 97.7 F (36.5 C), resp. rate 16, height 6\' 1"  (1.854 m), weight 76 kg, SpO2 100 %.  Constitutional: No distress . Vital signs reviewed. HENT: Normocephalic.  Atraumatic. Eyes: EOMI. No discharge. Cardiovascular: No JVD.  RRR. Respiratory: Normal effort.  No stridor.  Bilateral clear to auscultation. GI: Non-distended.  BS +. Skin: Warm and dry.  Intact. Psych: Normal mood.  Normal behavior. Musc: No edema in extremities.  No tenderness in extremities. Neuro: Alert Moderate dysarthria, stable Motor: 5/5 throughout, stable Mild facial weakness   Assessment/Plan: 1. Functional deficits which require 3+ hours per day of interdisciplinary therapy in a comprehensive inpatient rehab setting.  Physiatrist is providing close team supervision and 24 hour management of active medical problems listed below.  Physiatrist and rehab team continue to assess barriers to discharge/monitor patient progress toward functional and medical goals  Care Tool:  Bathing    Body parts bathed by patient: Right arm, Left arm, Chest, Abdomen, Front perineal area, Right upper leg, Left upper leg, Face, Buttocks, Left lower leg, Right lower leg   Body parts bathed by helper: Buttocks Body  parts n/a: Left lower leg, Right lower leg   Bathing assist Assist Level: Contact Guard/Touching assist     Upper Body Dressing/Undressing Upper body dressing   What is the patient wearing?: Pull over shirt    Upper body assist Assist Level: Set up assist    Lower Body Dressing/Undressing Lower body dressing      What is the patient wearing?: Incontinence brief, Pants     Lower body assist Assist for lower body dressing: Contact Guard/Touching assist     Toileting Toileting    Toileting assist Assist for toileting: Contact Guard/Touching assist Assistive Device Comment: urinal   Transfers Chair/bed transfer  Transfers assist     Chair/bed transfer assist level: Minimal Assistance - Patient > 75%     Locomotion Ambulation   Ambulation assist      Assist level: Maximal Assistance - Patient 25 - 49% Assistive device: No Device Max distance: 18ft   Walk 10 feet activity   Assist  Walk 10 feet activity did not occur: N/A  Assist level: Maximal Assistance - Patient 25 - 49% Assistive device: No Device   Walk 50 feet activity   Assist Walk 50 feet with 2 turns activity did not occur: Safety/medical concerns         Walk 150 feet activity   Assist Walk 150 feet activity did not occur: N/A         Walk 10 feet on uneven surface  activity   Assist Walk 10 feet on uneven  surfaces activity did not occur: N/A         Wheelchair     Assist Will patient use wheelchair at discharge?:  (TBD)             Wheelchair 50 feet with 2 turns activity    Assist        Assist Level: Total Assistance - Patient < 25%   Wheelchair 150 feet activity     Assist          Medical Problem List and Plan: 1.  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  Antithrombotics: -DVT/anticoagulation:   Lovenox changed to Eliquis  Doppler showing posterior tib/peroneal and gastroc DVTs left lower  extremity.             -antiplatelet therapy: Aspirin 81 mg daily, Plavix 75 mg daily DC'd due to initiation of Eliquis, transition to ASA after 3 to 6 months per neurology 3. Pain Management: Tylenol as needed  Robaxin as needed  Controlled with meds on 11/26 4. Mood: Provide emotional support             -antipsychotic agents: N/A 5. Neuropsych: This patient is not fully capable of making decisions on his own behalf. 6. Skin/Wound Care: Routine skin checks 7. Fluids/Electrolytes/Nutrition: Routine in and outs 8.    Post stroke dysphagia.  Dysphagia #3 thin liquids.             Advance diet as tolerated 9. Essential hypertension.  Patient on Norvasc 2.5 mg daily and Zestoretic 10-12.5 mg daily prior to admission.  Resume as needed   Vitals:   05/15/20 0422 05/15/20 1403  BP: 117/64 116/77  Pulse: 93 96  Resp: 18 16  Temp: 98.8 F (37.1 C) 97.7 F (36.5 C)  SpO2: 96% 100%   Controlled on 11/26 10.  Hyperlipidemia: Lipitor 11. Hiccups- Resolved  Continue baclofen 10 mg TID, decreased to 5 3 times daily on 11/17, d/ced on 11/18 12. Constipation  Bowel meds increased on 11/23, increased again 11/24  Improving 13.  Hypokalemia  Potassium 4.0 on 11/26 after supplementation 14.  Transaminitis  ALT remains elevated, but improving on 11/19 15.  Hypoalbuminemia  Supplement initiated on 11/15 16.?  CKD stage IIIa  GFR 51 on 11/26  Encourage fluids  Continue to monitor 17.  Acute blood loss anemia  Hemoglobin 10.3 on 11/26  Continue to monitor 18.  Left iliopsoas strain  MRI suggesting muscle strain versus partial tear with tendinopathy  See #3  LOS: 14 days A FACE TO FACE EVALUATION WAS PERFORMED  Kayvon Mo [name] 05/15/2020, 4:45 PM

## 2020-05-15 NOTE — Progress Notes (Signed)
Physical Therapy Session Note  Patient Details  Name: Tyler Glass MRN: [id number] Date of Birth: 09-07-1933  Today's Date: 05/15/2020 PT Individual Time: 1100-1200 + 1335-1400 PT Individual Time Calculation (min): 60 min  + 25 min  Short Term Goals: Week 2:  PT Short Term Goal 1 (Week 2): Pt will perform sit<>stand transfers with CGA and LRAD PT Short Term Goal 2 (Week 2): Pt will perform bed<>chair transfers with supervision and LRAD PT Short Term Goal 3 (Week 2): Pt will ambulate 131ft with minA and LRAD PT Short Term Goal 4 (Week 2): Pt will initiate stair training  Skilled Therapeutic Interventions/Progress Updates:     1st session: Pt greeted supine in bed, agreeable to PT session, no reports of pain. Performed supine<>sit with supervision with HOB raised and squat<>pivot transfer with CGA from EOB to w/c. When seated in w/c, noted heavily saturated linen. Likely that urinal spilt while he was using it prior to PT arrival. Wheeled sinkside to perform pericare. Removed wet boxers/pants in seated position and performed sit<>stand with minA in w/c with sinkside support. Provided him with soaped washcloths where he performed pericare in standing with minA guard for balance and setupA for washcloths. Donned boxers/pants in sitting and pulled them over hips in standing with minA guard.  He also was able to brush his teeth with minA guard in standing with sinkside support. He propelled himself in w/c with BLE's only, focusing on coordination and R hamstring facilitation, ~164ft. Used BUE's for remaining distances to main therapy gym. Performed squat<>pivot transfer with CGA from w/c to mat table. Performed sit<>stand with minA and instructed to perform static stepping on 3inch platform with LLE and then RLE. He requires modA for balance while stepping with RLE and noted increased RLE ataxia. Placed mirror and visual targets for feedback. He performed several repetitoins of this in both forward  stepping and lateral stepping. He then completed standing and 5lb shldr press with dowel rod, requiring minA for balance. Finished session with standing chop/lift patters with 2kg med ball, again requiring minA for standing balance. Squat<>pivot with CGA from mat table to w/c and returned to his room where he remained seated in w/c with chair alarm on, needs in reach.  2nd session: Pt greeted seated in w/c, agreeable to PT session, no reports of pain. Pt propelled himself in w/c with BUE's for purposes of strengthening activity tolerance, >150ft from his room to main therapy gym. Focus of session on gait training. He ambulated 2x168ft, initially requiring minA, regressing to light modA with fatigue, on level surfaces with L hand rail. Pt with improved ability to correct R pushing but struggles with maintaining midline, especially without hand rail support. He also performed side stepping L<>R with minA guard with BUE support on hand rail, ~65ft. Cues during gait training for widening BOS, midline awareness, reducing R lean/push, and cadence. Therapist providing multi-modal cueing but focusing on internal cues for self corrections and awareness. He demo's improved gait speed and R heel strike with gait. He propelled himself with supervision in w/c back to his room where he remained seated in w/c with chair alarm on, needs in reach.   Therapy Documentation Precautions:  Precautions Precautions: Fall, Other (comment) Precaution Comments: R hemiparesis with strong R lean Restrictions Weight Bearing Restrictions: No  Therapy/Group: Individual Therapy  Tabetha Haraway P Jessi Jessop PT 05/15/2020, 7:51 AM

## 2020-05-15 NOTE — Plan of Care (Signed)
  Problem: Consults Goal: RH STROKE PATIENT EDUCATION Description: See Patient Education module for education specifics  Outcome: Progressing   Problem: RH BOWEL ELIMINATION Goal: RH STG MANAGE BOWEL WITH ASSISTANCE Description: STG Manage Bowel with supervision Assistance. Outcome: Progressing   Problem: RH BLADDER ELIMINATION Goal: RH STG MANAGE BLADDER WITH ASSISTANCE Description: STG Manage Bladder With supervision Assistance Outcome: Progressing   Problem: RH SKIN INTEGRITY Goal: RH STG MAINTAIN SKIN INTEGRITY WITH ASSISTANCE Description: STG Maintain Skin Integrity With supervision Assistance. Outcome: Progressing   Problem: RH SAFETY Goal: RH STG ADHERE TO SAFETY PRECAUTIONS W/ASSISTANCE/DEVICE Description: STG Adhere to Safety Precautions With supervision Assistance/Device. Outcome: Progressing   Problem: RH PAIN MANAGEMENT Goal: RH STG PAIN MANAGED AT OR BELOW PT'S PAIN GOAL Description: <3 on a 0-10 pain scale Outcome: Progressing   Problem: RH KNOWLEDGE DEFICIT Goal: RH STG INCREASE KNOWLEDGE OF STROKE PROPHYLAXIS Description: Patient will be able to demonstrate knowledge on medication management, medications used for future stroke prevention, and follow up care with the MD post discharge with min assist from CIR staff. Outcome: Progressing   

## 2020-05-16 ENCOUNTER — Inpatient Hospital Stay (HOSPITAL_COMMUNITY): Payer: Medicare HMO | Admitting: Occupational Therapy

## 2020-05-16 ENCOUNTER — Inpatient Hospital Stay (HOSPITAL_COMMUNITY): Payer: Medicare HMO

## 2020-05-16 NOTE — Progress Notes (Signed)
Physical Therapy Session Note  Patient Details  Name: Tyler Glass MRN: [id number] Date of Birth: Dec 07, 1933  Today's Date: 05/16/2020 PT Individual Time: 9629-5284 PT Individual Time Calculation (min): 70 min   Short Term Goals: Week 2:  PT Short Term Goal 1 (Week 2): Pt will perform sit<>stand transfers with CGA and LRAD PT Short Term Goal 2 (Week 2): Pt will perform bed<>chair transfers with supervision and LRAD PT Short Term Goal 3 (Week 2): Pt will ambulate 150ft with minA and LRAD PT Short Term Goal 4 (Week 2): Pt will initiate stair training  Skilled Therapeutic Interventions/Progress Updates:    Pt greeted sitting in w/c, agreeable to PT session, no reports of pain. He propelled himself with supervision in w/c from his room to main therapy gym. Performed squat<>pivot transfer with CGA from w/c to mat table. Performed NMR for dynamic standing balance with minA (regressing to modA) guard while he completed dual-cognitive tasks for semi-circle stepping with color/number presentation. Increased difficulty with RLE stepping R lean/push worsened with fatigue. Focused remainder of session on gait training. He ambulated ~85ft with minA and no AD + 159ft with minA and RW + 121ft with minA and RW + 137ft with minA and RW (seated rest breaks). Cues throughout for improving midline awareness, keeping wide BOS, and reducing R lateral lean. Performed furniture sit<>stand transfers on sofa couch with supervision and RW. He also was able to complete bed mobility, including sit<>supine and rolling with supervision on regular flat bed. Pt then reporting need to void so he ambulated with minA and RW to ADL bathroom where he performed stand>sit with minA for controlled lowering to 3-1 BSC. Pt continent of bowel and bladder, charted. He requiring CGA for sit>stand to RW from 3-[facility] and ambulated with minA and RW to sink where he performed hand hygiene in standing with CGA guard. Ambulated an additional ~160ft  with minA and RW to main therapy gym where he negotiated up/down x4 steps with mostly minA however he had x1 instance of LARGE buckling with RLE requiring MAXA for recovery while he was ascending the stairs. He propelled himself with supervision in w/c back to his room and performed squat<>pivot with CGA back to bed. Sit>supine with supervision where he remained semi-reclined in bed with needs in reach, bed alarm on.   Therapy Documentation Precautions:  Precautions Precautions: Fall, Other (comment) Precaution Comments: R hemiparesis with strong R lean Restrictions Weight Bearing Restrictions: No  Therapy/Group: Individual Therapy  Rhyatt Muska P Melodee Lupe PT 05/16/2020, 3:27 PM

## 2020-05-16 NOTE — Plan of Care (Signed)
°  Problem: Consults Goal: RH STROKE PATIENT EDUCATION Description: See Patient Education module for education specifics  Outcome: Progressing   Problem: RH BOWEL ELIMINATION Goal: RH STG MANAGE BOWEL WITH ASSISTANCE Description: STG Manage Bowel with supervision Assistance. Outcome: Progressing   Problem: RH BLADDER ELIMINATION Goal: RH STG MANAGE BLADDER WITH ASSISTANCE Description: STG Manage Bladder With supervision Assistance Outcome: Progressing   Problem: RH SKIN INTEGRITY Goal: RH STG MAINTAIN SKIN INTEGRITY WITH ASSISTANCE Description: STG Maintain Skin Integrity With supervision Assistance. Outcome: Progressing   Problem: RH SAFETY Goal: RH STG ADHERE TO SAFETY PRECAUTIONS W/ASSISTANCE/DEVICE Description: STG Adhere to Safety Precautions With supervision Assistance/Device. Outcome: Progressing   Problem: RH PAIN MANAGEMENT Goal: RH STG PAIN MANAGED AT OR BELOW PT'S PAIN GOAL Description: <3 on a 0-10 pain scale Outcome: Progressing   Problem: RH KNOWLEDGE DEFICIT Goal: RH STG INCREASE KNOWLEDGE OF STROKE PROPHYLAXIS Description: Patient will be able to demonstrate knowledge on medication management, medications used for future stroke prevention, and follow up care with the MD post discharge with min assist from CIR staff. Outcome: Progressing   

## 2020-05-16 NOTE — Progress Notes (Signed)
Occupational Therapy Session Note  Patient Details  Name: Tyler Glass MRN: [id number] Date of Birth: 07-08-1933  Today's Date: 05/16/2020 OT Individual Time: 1030-1110 OT Individual Time Calculation (min): 40 min    Short Term Goals: Week 3:  OT Short Term Goal 1 (Week 3): STGs=LTGs due to ELOS   Skilled Therapeutic Interventions/Progress Updates:    Pt greeted at time of session reclined in bed resting but agreeable to OT session. No c/o pain, wanting to change clothes and wash up. Supine to sit Supervision, sit to stand Min and transferred to wheelchair in same manner with RW, guarding R side with mild lean noted and decreased safety when sitting in chair, cues to fully turn prior to sitting down. Set up at sink level and performed UB bathing supervision, LB bathing w/ CGA including periarea in standing with CGA/close supervision for standing balance. Frequent cues throughout standing to decrease mild R lean, mirror for feedback. Donned shirt set up, underwear and pants with CGA in standing pt able to thread and don over hips. Also donned slip on bedroom shoes with set up. Oral hygiene set up from seated level. Set up with alarm on, call bell in reach, all needs met.   Therapy Documentation Precautions:  Precautions Precautions: Fall, Other (comment) Precaution Comments: R hemiparesis with strong R lean Restrictions Weight Bearing Restrictions: No    Therapy/Group: Individual Therapy  Viona Gilmore 05/16/2020, 11:02 AM

## 2020-05-16 NOTE — Progress Notes (Signed)
[name] PHYSICAL MEDICINE & REHABILITATION PROGRESS NOTE   Subjective/Complaints: Patient seen laying in bed this morning. He states he slept well overnight.  ROS: Denies CP, SOB, N/V/D  Objective:   No results found. Recent Labs    05/15/20 0433  WBC 5.3  HGB 10.3*  HCT 31.6*  PLT 372   Recent Labs    05/15/20 0433  NA 140  K 4.0  CL 101  CO2 29  GLUCOSE 96  BUN 18  CREATININE 1.36*  CALCIUM 9.4    Intake/Output Summary (Last 24 hours) at 05/16/2020 0944 Last data filed at 05/16/2020 0730 Gross per 24 hour  Intake 662 ml  Output 750 ml  Net -88 ml        Physical Exam: Vital Signs Blood pressure 111/67, pulse 93, temperature 98.7 F (37.1 C), temperature source Oral, resp. rate 18, height 6\' 1"  (1.854 m), weight 76.3 kg, SpO2 96 %.  Constitutional: No distress . Vital signs reviewed. HENT: Normocephalic.  Atraumatic. Eyes: EOMI. No discharge. Cardiovascular: No JVD.  RRR. Respiratory: Normal effort.  No stridor.  Bilateral clear to auscultation. GI: Non-distended.  BS +. Skin: Warm and dry.  Intact. Psych: Normal mood.  Normal behavior. Musc: No edema in extremities.  No tenderness in extremities. Neuro: Alert Moderate dysarthria, stable Motor: 5/5 throughout, unchanged Mild facial weakness   Assessment/Plan: 1. Functional deficits which require 3+ hours per day of interdisciplinary therapy in a comprehensive inpatient rehab setting.  Physiatrist is providing close team supervision and 24 hour management of active medical problems listed below.  Physiatrist and rehab team continue to assess barriers to discharge/monitor patient progress toward functional and medical goals  Care Tool:  Bathing    Body parts bathed by patient: Right arm, Left arm, Chest, Abdomen, Front perineal area, Right upper leg, Left upper leg, Face, Buttocks, Left lower leg, Right lower leg   Body parts bathed by helper: Buttocks Body parts n/a: Left lower leg, Right  lower leg   Bathing assist Assist Level: Contact Guard/Touching assist     Upper Body Dressing/Undressing Upper body dressing   What is the patient wearing?: Pull over shirt    Upper body assist Assist Level: Set up assist    Lower Body Dressing/Undressing Lower body dressing      What is the patient wearing?: Incontinence brief, Pants     Lower body assist Assist for lower body dressing: Contact Guard/Touching assist     Toileting Toileting    Toileting assist Assist for toileting: Contact Guard/Touching assist Assistive Device Comment: urinal   Transfers Chair/bed transfer  Transfers assist     Chair/bed transfer assist level: Minimal Assistance - Patient > 75%     Locomotion Ambulation   Ambulation assist      Assist level: Maximal Assistance - Patient 25 - 49% Assistive device: No Device Max distance: 5ft   Walk 10 feet activity   Assist  Walk 10 feet activity did not occur: N/A  Assist level: Maximal Assistance - Patient 25 - 49% Assistive device: No Device   Walk 50 feet activity   Assist Walk 50 feet with 2 turns activity did not occur: Safety/medical concerns         Walk 150 feet activity   Assist Walk 150 feet activity did not occur: N/A         Walk 10 feet on uneven surface  activity   Assist Walk 10 feet on uneven surfaces activity did not occur: N/A  Wheelchair     Assist Will patient use wheelchair at discharge?:  (TBD)             Wheelchair 50 feet with 2 turns activity    Assist        Assist Level: Total Assistance - Patient < 25%   Wheelchair 150 feet activity     Assist          Medical Problem List and Plan: 1.  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  Antithrombotics: -DVT/anticoagulation:   Lovenox changed to Eliquis  Doppler showing posterior tib/peroneal and gastroc DVTs left lower extremity.              -antiplatelet therapy: Aspirin 81 mg daily, Plavix 75 mg daily DC'd due to initiation of Eliquis, transition to ASA after 3 to 6 months per neurology 3. Pain Management: Tylenol as needed  Robaxin as needed  Controlled with meds on 11/27 4. Mood: Provide emotional support             -antipsychotic agents: N/A 5. Neuropsych: This patient is not fully capable of making decisions on his own behalf. 6. Skin/Wound Care: Routine skin checks 7. Fluids/Electrolytes/Nutrition: Routine in and outs 8.    Post stroke dysphagia.  Dysphagia #3 thin liquids.             Advance diet as tolerated 9. Essential hypertension.  Patient on Norvasc 2.5 mg daily and Zestoretic 10-12.5 mg daily prior to admission.  Resume as needed   Vitals:   05/15/20 1941 05/16/20 0251  BP: 114/70 111/67  Pulse: 88 93  Resp: 17 18  Temp: 98.1 F (36.7 C) 98.7 F (37.1 C)  SpO2: 98% 96%   Controlled on 11/27 10.  Hyperlipidemia: Lipitor 11. Hiccups- Resolved  Continue baclofen 10 mg TID, decreased to 5 3 times daily on 11/17, d/ced on 11/18 12. Constipation  Bowel meds increased on 11/23, increased again 11/24  Improved 13.  Hypokalemia  Potassium 4.0 on 11/26 after supplementation 14.  Transaminitis  ALT remains elevated, but improving on 11/19, labs ordered for Monday 15.  Hypoalbuminemia  Supplement initiated on 11/15 16.?  CKD stage IIIa  GFR 51 on 11/26, labs ordered for Monday  Encourage fluids  Continue to monitor 17.  Acute blood loss anemia  Hemoglobin 10.3 on 11/26  Continue to monitor 18.  Left iliopsoas strain  MRI suggesting muscle strain versus partial tear with tendinopathy  See #3  LOS: 15 days A FACE TO FACE EVALUATION WAS PERFORMED  Tyler Glass [name] 05/16/2020, 9:44 AM

## 2020-05-17 ENCOUNTER — Inpatient Hospital Stay (HOSPITAL_COMMUNITY): Payer: Medicare HMO

## 2020-05-17 DIAGNOSIS — M25561 Pain in right knee: Secondary | ICD-10-CM

## 2020-05-17 DIAGNOSIS — G8929 Other chronic pain: Secondary | ICD-10-CM

## 2020-05-17 MED ORDER — DICLOFENAC SODIUM 1 % EX GEL
2.0000 g | Freq: Four times a day (QID) | CUTANEOUS | Status: DC | PRN
  Administered 2020-05-17 – 2020-05-23 (×3): 2 g via TOPICAL
  Filled 2020-05-17: qty 100

## 2020-05-17 MED ORDER — DICLOFENAC SODIUM 1 % EX GEL: 2.0000 g | Freq: Four times a day (QID) | CUTANEOUS | Status: DC | PRN

## 2020-05-17 NOTE — Plan of Care (Signed)
  Problem: Consults Goal: RH STROKE PATIENT EDUCATION Description: See Patient Education module for education specifics  Outcome: Progressing   Problem: RH BOWEL ELIMINATION Goal: RH STG MANAGE BOWEL WITH ASSISTANCE Description: STG Manage Bowel with supervision Assistance. Outcome: Progressing   Problem: RH BLADDER ELIMINATION Goal: RH STG MANAGE BLADDER WITH ASSISTANCE Description: STG Manage Bladder With supervision Assistance Outcome: Progressing   Problem: RH SKIN INTEGRITY Goal: RH STG MAINTAIN SKIN INTEGRITY WITH ASSISTANCE Description: STG Maintain Skin Integrity With supervision Assistance. Outcome: Progressing   Problem: RH SAFETY Goal: RH STG ADHERE TO SAFETY PRECAUTIONS W/ASSISTANCE/DEVICE Description: STG Adhere to Safety Precautions With supervision Assistance/Device. Outcome: Progressing   Problem: RH PAIN MANAGEMENT Goal: RH STG PAIN MANAGED AT OR BELOW PT'S PAIN GOAL Description: <3 on a 0-10 pain scale Outcome: Progressing   Problem: RH KNOWLEDGE DEFICIT Goal: RH STG INCREASE KNOWLEDGE OF STROKE PROPHYLAXIS Description: Patient will be able to demonstrate knowledge on medication management, medications used for future stroke prevention, and follow up care with the MD post discharge with min assist from CIR staff. Outcome: Progressing   

## 2020-05-17 NOTE — Progress Notes (Signed)
Speech Language Pathology Daily Session Note  Patient Details  Name: Tyler Glass MRN: [id number] Date of Birth: 09/11/1933  Today's Date: 05/17/2020 SLP Individual Time: 1415-1500 SLP Individual Time Calculation (min): 45 min  Short Term Goals: Week 2: SLP Short Term Goal 1 (Week 2): Patient will consume current diet of Dys. 3 textures with thin liquids with minimal overt s/s of aspiration and overall Mod I with use of swallowing compensatory strategies. SLP Short Term Goal 1 - Progress (Week 2): Updated due to goal met SLP Short Term Goal 2 (Week 2): Pt will consume current diet with mod I use of swallow strategies. SLP Short Term Goal 3 (Week 2): Pt will complete pharyngeal strength exercises with supervision A verbal cues for accuracy. SLP Short Term Goal 4 (Week 2): Pt will complete basic-mildly complex problem solving tasks with min A verbal cues. SLP Short Term Goal 5 (Week 2): Pt will utilize internal and external aids to facilitate recall of daily information with min assist multimodal cues. SLP Short Term Goal 6 (Week 2): Patient will demonstrate efficient mastication and complete oral clearance with trials of regular textures over 2 sessions prior to upgrade.  Skilled Therapeutic Interventions: Pt seen for skilled ST session focusing on dysphagia and cognitive function goals. Pt more agreeable to regular trials this date, states the only food he "wants to avoid" is dry chicken. Regular trials with mild prolonged mastication however bolus cohesion and transit appears functional. No overt s/s aspiration across any trials. Thin liquid via straw with 2 episodes delayed cough after swallow, difficulty to determine if related to swallow or reflux. Pt independently recalling recommendations to avoid suction, instead performing comprehensive oral care. Pt educated on continued use of compensatory swallow strategies and swallow exercises (primarily CTAR and Masako). SLP facilitating safety task  by providing min A verbal cues to increase problem solving/potential hazard recognition awareness for discharge to daughter's home. Pt left upright in bed with alarm on, all needs within reach. Cont ST POC.  Pain Pain Assessment Pain Scale: 0-10 Pain Score: 0-No pain  Therapy/Group: Individual Therapy   R  05/17/2020, 2:50 PM   

## 2020-05-17 NOTE — Progress Notes (Signed)
[facility] PHYSICAL MEDICINE & REHABILITATION PROGRESS NOTE   Subjective/Complaints: Patient seen sitting up in bed this morning.  He states he slept well overnight.  He complains of right knee pain and states that he used to wear a brace in [place], but does not recall the brace.  ROS: + Intermittent right knee pain.  Denies CP, SOB, N/V/D  Objective:   No results found. Recent Labs    05/15/20 0433  WBC 5.3  HGB 10.3*  HCT 31.6*  PLT 372   Recent Labs    05/15/20 0433  NA 140  K 4.0  CL 101  CO2 29  GLUCOSE 96  BUN 18  CREATININE 1.36*  CALCIUM 9.4    Intake/Output Summary (Last 24 hours) at 05/17/2020 0851 Last data filed at 05/17/2020 0800 Gross per 24 hour  Intake 1120 ml  Output 1675 ml  Net -555 ml        Physical Exam: Vital Signs Blood pressure 104/66, pulse 88, temperature 98.7 F (37.1 C), temperature source Oral, resp. rate 16, height 6\' 1"  (1.854 m), weight 76.3 kg, SpO2 98 %.  Constitutional: No distress . Vital signs reviewed. HENT: Normocephalic.  Atraumatic. Eyes: EOMI. No discharge. Cardiovascular: No JVD.  RRR. Respiratory: Normal effort.  No stridor.  Bilateral clear to auscultation. GI: Non-distended.  BS +. Skin: Warm and dry.  Intact. Psych: Normal mood.  Normal behavior. Musc: No edema in extremities.  No tenderness in extremities, including right knee. Neuro: Alert Moderate dysarthria, unchanged Motor: 5/5 throughout, stable Mild facial weakness   Assessment/Plan: 1. Functional deficits which require 3+ hours per day of interdisciplinary therapy in a comprehensive inpatient rehab setting.  Physiatrist is providing close team supervision and 24 hour management of active medical problems listed below.  Physiatrist and rehab team continue to assess barriers to discharge/monitor patient progress toward functional and medical goals  Care Tool:  Bathing    Body parts bathed by patient: Right arm, Left arm, Chest,  Abdomen, Front perineal area, Right upper leg, Left upper leg, Face, Buttocks, Left lower leg, Right lower leg   Body parts bathed by helper: Buttocks Body parts n/a: Left lower leg, Right lower leg   Bathing assist Assist Level: Contact Guard/Touching assist     Upper Body Dressing/Undressing Upper body dressing   What is the patient wearing?: Pull over shirt    Upper body assist Assist Level: Set up assist    Lower Body Dressing/Undressing Lower body dressing      What is the patient wearing?: Pants, Underwear/pull up     Lower body assist Assist for lower body dressing: Contact Guard/Touching assist     Toileting Toileting    Toileting assist Assist for toileting: Contact Guard/Touching assist Assistive Device Comment: urinal   Transfers Chair/bed transfer  Transfers assist     Chair/bed transfer assist level: Minimal Assistance - Patient > 75%     Locomotion Ambulation   Ambulation assist      Assist level: Maximal Assistance - Patient 25 - 49% Assistive device: No Device Max distance: 18ft   Walk 10 feet activity   Assist  Walk 10 feet activity did not occur: N/A  Assist level: Maximal Assistance - Patient 25 - 49% Assistive device: No Device   Walk 50 feet activity   Assist Walk 50 feet with 2 turns activity did not occur: Safety/medical concerns         Walk 150 feet activity   Assist Walk 150 feet activity  did not occur: N/A         Walk 10 feet on uneven surface  activity   Assist Walk 10 feet on uneven surfaces activity did not occur: N/A         Wheelchair     Assist Will patient use wheelchair at discharge?:  (TBD)             Wheelchair 50 feet with 2 turns activity    Assist        Assist Level: Total Assistance - Patient < 25%   Wheelchair 150 feet activity     Assist          Medical Problem List and Plan: 1.  Dysarthria/dysphagia secondary to right lateral medullary  infarct  Continue CIR 2.  Antithrombotics: -DVT/anticoagulation:   Lovenox changed to Eliquis  Doppler showing posterior tib/peroneal and gastroc DVTs left lower extremity.             -antiplatelet therapy: Aspirin 81 mg daily, Plavix 75 mg daily DC'd due to initiation of Eliquis, transition to ASA after 3 to 6 months per neurology 3. Pain Management: Tylenol as needed  Robaxin as needed  Voltaren gel ordered for right knee 4. Mood: Provide emotional support             -antipsychotic agents: N/A 5. Neuropsych: This patient is not fully capable of making decisions on his own behalf. 6. Skin/Wound Care: Routine skin checks 7. Fluids/Electrolytes/Nutrition: Routine in and outs 8.    Post stroke dysphagia.  Dysphagia #3 thin liquids.             Advance diet as tolerated 9. Essential hypertension.  Patient on Norvasc 2.5 mg daily and Zestoretic 10-12.5 mg daily prior to admission.  Resume as needed   Vitals:   05/16/20 1947 05/17/20 0336  BP: 126/74 104/66  Pulse: 89 88  Resp: 18 16  Temp: (!) 97.4 F (36.3 C) 98.7 F (37.1 C)  SpO2: 98% 98%   Controlled on 11/28 10.  Hyperlipidemia: Lipitor 11. Hiccups- Resolved  Continue baclofen 10 mg TID, decreased to 5 3 times daily on 11/17, d/ced on 11/18 12. Constipation  Bowel meds increased on 11/23, increased again 11/24  Improved 13.  Hypokalemia  Potassium 4.0 on 11/26 after supplementation, labs ordered for tomorrow 14.  Transaminitis  ALT remains elevated, but improving on 11/19, labs ordered for tomorrow 15.  Hypoalbuminemia  Supplement initiated on 11/15 16.?  CKD stage IIIa  GFR 51 on 11/26, labs ordered for tomorrow  Encourage fluids  Continue to monitor 17.  Acute blood loss anemia  Hemoglobin 10.3 on 11/26  Continue to monitor 18.  Left iliopsoas strain  MRI suggesting muscle strain versus partial tear with tendinopathy  See #3 19.  Right knee pain  Likely OA  See #3  Requested patient to review with daughter  type of brace that patient has that has been effective for him   LOS: 16 days A FACE TO FACE EVALUATION WAS PERFORMED  Aleshka Corney [name] 05/17/2020, 8:51 AM

## 2020-05-18 ENCOUNTER — Inpatient Hospital Stay (HOSPITAL_COMMUNITY): Payer: Medicare HMO

## 2020-05-18 ENCOUNTER — Inpatient Hospital Stay (HOSPITAL_COMMUNITY): Payer: Medicare HMO | Admitting: Occupational Therapy

## 2020-05-18 LAB — COMPREHENSIVE METABOLIC PANEL
ALT: 61 U/L — ABNORMAL HIGH (ref 0–44)
AST: 38 U/L (ref 15–41)
Albumin: 2.9 g/dL — ABNORMAL LOW (ref 3.5–5.0)
Alkaline Phosphatase: 83 U/L (ref 38–126)
Anion gap: 7 (ref 5–15)
BUN: 18 mg/dL (ref 8–23)
CO2: 28 mmol/L (ref 22–32)
Calcium: 8.9 mg/dL (ref 8.9–10.3)
Chloride: 104 mmol/L (ref 98–111)
Creatinine, Ser: 1.34 mg/dL — ABNORMAL HIGH (ref 0.61–1.24)
GFR, Estimated: 52 mL/min — ABNORMAL LOW (ref >60)
Glucose, Bld: 97 mg/dL (ref 70–99)
Potassium: 3.7 mmol/L (ref 3.5–5.1)
Sodium: 139 mmol/L (ref 135–145)
Total Bilirubin: 0.2 mg/dL — ABNORMAL LOW (ref 0.3–1.2)
Total Protein: 5.5 g/dL — ABNORMAL LOW (ref 6.5–8.1)

## 2020-05-18 MED ORDER — HYDROCERIN EX CREA
TOPICAL_CREAM | Freq: Two times a day (BID) | CUTANEOUS | Status: DC
  Filled 2020-05-18 (×2): qty 113

## 2020-05-18 NOTE — Progress Notes (Signed)
Occupational Therapy Session Note  Patient Details  Name: Tyler Glass MRN: [id number] Date of Birth: 19-Nov-1933  Today's Date: 05/18/2020 OT Individual Time: 1000-1100 OT Individual Time Calculation (min): 60 min    Short Term Goals: Week 3:  OT Short Term Goal 1 (Week 3): STGs=LTGs due to ELOS  Skilled Therapeutic Interventions/Progress Updates:    First session: Pt supine in bed, no c/o pain, agreeable to OT session.  Supine to sit with supervision.  Pt donned shoes with setup needing VCs to initiate due to pt attempting to initiate sit to stand with only socks (no grips) on.  Pt ambulated EOB to 3 in 1 commode over toilet using RW with CGA.  Pt completed clothing mgt and toilet transfer with CGA.  VCs needed for safe hand placement due to attempting to maintain BUE on RW during descent.  Pt had continent episode of bowel.  Pericare and toilet transfer completed with CGA.  Pt ambulated to sink using RW with CGA.  Pt completed UB bathing and dressing with setup and LB bathing and dressing with CGA overall. Pt needing intermittent VCs for safe hand placement during sit<>stand transfers at sink during self care. Pt sitting up in w/c at end of session, call bell in reach, bed alarm on.    Attempted second session with pt for 15 minute duration, however upon OT arrival, pt sleeping and reporting he would like to defer OT at this time due to fatigue.  Missed 15 minutes of OT treatment.    Therapy Documentation Precautions:  Precautions Precautions: Fall, Other (comment) Precaution Comments: R hemiparesis with strong R lean Restrictions Weight Bearing Restrictions: No   Therapy/Group: Individual Therapy  Amie Critchley 05/18/2020, 3:53 PM

## 2020-05-18 NOTE — Progress Notes (Signed)
Speech Language Pathology Weekly Progress and Session Note  Patient Details  Name: Tyler Glass MRN: [id number] Date of Birth: 06-May-1934  Beginning of progress report period: May 11, 2020 End of progress report period: May 18, 2020  Today's Date: 05/18/2020 SLP Individual Time: 0902-0959 SLP Individual Time Calculation (min): 57 min  Short Term Goals: Week 2: SLP Short Term Goal 1 (Week 2): Patient will consume current diet of Dys. 3 textures with thin liquids with minimal overt s/s of aspiration and overall Mod I with use of swallowing compensatory strategies. SLP Short Term Goal 1 - Progress (Week 2): Met SLP Short Term Goal 2 (Week 2): Pt will consume current diet with mod I use of swallow strategies. SLP Short Term Goal 2 - Progress (Week 2): Met SLP Short Term Goal 3 (Week 2): Pt will complete pharyngeal strength exercises with supervision A verbal cues for accuracy. SLP Short Term Goal 3 - Progress (Week 2): Met SLP Short Term Goal 4 (Week 2): Pt will complete basic-mildly complex problem solving tasks with min A verbal cues. SLP Short Term Goal 4 - Progress (Week 2): Not met SLP Short Term Goal 5 (Week 2): Pt will utilize internal and external aids to facilitate recall of daily information with min assist multimodal cues. SLP Short Term Goal 5 - Progress (Week 2): Not met SLP Short Term Goal 6 (Week 2): Patient will demonstrate efficient mastication and complete oral clearance with trials of regular textures over 2 sessions prior to upgrade. SLP Short Term Goal 6 - Progress (Week 2): Met    New Short Term Goals: Week 3: SLP Short Term Goal 1 (Week 3): STG=LTG due to short ELOS  Weekly Progress Updates: Pt made good progress meeting 4 out 6 goals. Pt was upgraded to thin liquids and dys 3 textures following a MBS, and then to regular textures at bedside. Pt requires extend time to masticate regular textures but is supervision A- Mod I for use of swallow strategies. Pt  continues to require mod-min A verbal cues for basic-mildly complex problem solving and short term recall. SLP recommends family completes most IADLs including management of appointment, money and medications. Education has not been completed at this time and is needed prior to discharge. Pt would continue to benefit from skilled ST services in order to maximize functional independence and reduce burden of care, requiring supervision at discharge with continued skilled ST services.       Intensity: Minumum of 1-2 x/day, 30 to 90 minutes Frequency: 3 to 5 out of 7 days Duration/Length of Stay: 12/8 Treatment/Interventions: Cognitive remediation/compensation;Cueing hierarchy;Environmental controls;Dysphagia/aspiration precaution training;Internal/external aids;Speech/Language facilitation;Functional tasks;Patient/family education   Daily Session  Skilled Therapeutic Interventions:Skilled ST services focused on swallow and cognitive skills.  Pt consumed regular textured snack with slight prolonged mastication pt attributed to baseline ill-fitting dental partials. Pt demonstrated ability to clear oral cavity and no overt s/s aspiration. Pt expressed comfort in upgrading to regular textures and SLP placed lunch order via phone. SLP educated pt he does not need to continue pharyngeal strength exercises noting improvement in swallow noted on recent MBS, pt stated understanding. SLP attempted to call [place] on pt phone to participate managing medical appointments (as pt previous stated was his baseline for keeping track of appointments.) Pt was unable to recall [place] number, recognize in a list of number nor has [place] contact. When SLP called [facility] pt was unable to enter in identifying information further impacted by glasses not being present. SLP recommends family  completes IADLs such as appointment management, pt agreed. Pt was left in room with call bell within reach and bed alarm set. SLP recommends to continue  skilled services.    General    Pain Pain Assessment Pain Score: 0-No pain  Therapy/Group: Individual Therapy  Myron Lona  [facility] 05/18/2020, 4:34 PM

## 2020-05-18 NOTE — Progress Notes (Signed)
[place] PHYSICAL MEDICINE & REHABILITATION PROGRESS NOTE   Subjective/Complaints:  Pt reports he's wearing the R knee brace this Am that's been helpful for his knee pain- he just wanted to verify it was OK to wear it for therapy- I assured him it was.   Describes pain in R posterior medial knee- around tiny Baker's cyst.  Also , unhappy that legs and arms so dry.   ROS:  Pt denies SOB, abd pain, CP, N/V/C/D, and vision changes   Objective:   No results found. No results for input(s): WBC, HGB, HCT, PLT in the last 72 hours. Recent Labs    05/18/20 0629  NA 139  K 3.7  CL 104  CO2 28  GLUCOSE 97  BUN 18  CREATININE 1.34*  CALCIUM 8.9    Intake/Output Summary (Last 24 hours) at 05/18/2020 1816 Last data filed at 05/18/2020 1630 Gross per 24 hour  Intake 1380 ml  Output 1325 ml  Net 55 ml        Physical Exam: Vital Signs Blood pressure 107/68, pulse 86, temperature 97.8 F (36.6 C), resp. rate 16, height 6\' 1"  (1.854 m), weight 76.3 kg, SpO2 98 %.  Constitutional: sitting up in bed- legs out in front of him, NAD, wearing R knee brace HENT: Normocephalic.  Atraumatic. Eyes: EOMI. No discharge. Cardiovascular: RRR Respiratory: CTA B/L- no W/R/R- good air movement GI: Soft, NT, ND, (+)BS  Skin: Warm and dry.  Intact. Very dry scaly skin on arms, but mainly on knees and feet and lower legs B/L Psych: appropriate Musc: No edema in extremities.  TTP over medial posterior popliteal fossa- around tiny baker's cyst Neuro: Alert Moderate dysarthria, unchanged Motor: 5/5 throughout, stable Mild facial weakness   Assessment/Plan: 1. Functional deficits which require 3+ hours per day of interdisciplinary therapy in a comprehensive inpatient rehab setting.  Physiatrist is providing close team supervision and 24 hour management of active medical problems listed below.  Physiatrist and rehab team continue to assess barriers to discharge/monitor patient progress  toward functional and medical goals  Care Tool:  Bathing    Body parts bathed by patient: Right arm, Left arm, Chest, Abdomen, Front perineal area, Right upper leg, Left upper leg, Face, Buttocks, Left lower leg, Right lower leg   Body parts bathed by helper: Buttocks Body parts n/a: Left lower leg, Right lower leg   Bathing assist Assist Level: Contact Guard/Touching assist     Upper Body Dressing/Undressing Upper body dressing   What is the patient wearing?: Pull over shirt    Upper body assist Assist Level: Set up assist    Lower Body Dressing/Undressing Lower body dressing      What is the patient wearing?: Pants, Incontinence brief     Lower body assist Assist for lower body dressing: Contact Guard/Touching assist     Toileting Toileting    Toileting assist Assist for toileting: Contact Guard/Touching assist Assistive Device Comment: urinal   Transfers Chair/bed transfer  Transfers assist     Chair/bed transfer assist level: Contact Guard/Touching assist (w/RW)     Locomotion Ambulation   Ambulation assist      Assist level: Minimal Assistance - Patient > 75% Assistive device: Walker-rolling Max distance: 165   Walk 10 feet activity   Assist  Walk 10 feet activity did not occur: N/A  Assist level: Minimal Assistance - Patient > 75% Assistive device: Walker-rolling   Walk 50 feet activity   Assist Walk 50 feet with 2 turns activity  did not occur: Safety/medical concerns  Assist level: Minimal Assistance - Patient > 75% Assistive device: Walker-rolling    Walk 150 feet activity   Assist Walk 150 feet activity did not occur: N/A  Assist level: Minimal Assistance - Patient > 75% Assistive device: Walker-rolling    Walk 10 feet on uneven surface  activity   Assist Walk 10 feet on uneven surfaces activity did not occur: N/A         Wheelchair     Assist Will patient use wheelchair at discharge?:  (TBD)              Wheelchair 50 feet with 2 turns activity    Assist        Assist Level: Total Assistance - Patient < 25%   Wheelchair 150 feet activity     Assist          Medical Problem List and Plan: 1.  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  Antithrombotics: -DVT/anticoagulation:   Lovenox changed to Eliquis  Doppler showing posterior tib/peroneal and gastroc DVTs left lower extremity.             -antiplatelet therapy: Aspirin 81 mg daily, Plavix 75 mg daily DC'd due to initiation of Eliquis, transition to ASA after 3 to 6 months per neurology 3. Pain Management: Tylenol as needed  Robaxin as needed  Voltaren gel ordered for right knee  11/29- said pain better with R knee brace- con't regimen 4. Mood: Provide emotional support             -antipsychotic agents: N/A 5. Neuropsych: This patient is not fully capable of making decisions on his own behalf. 6. Skin/Wound Care: Routine skin checks 7. Fluids/Electrolytes/Nutrition: Routine in and outs 8.    Post stroke dysphagia.  Dysphagia #3 thin liquids.             Advance diet as tolerated 9. Essential hypertension.  Patient on Norvasc 2.5 mg daily and Zestoretic 10-12.5 mg daily prior to admission.  Resume as needed   Vitals:   05/18/20 0350 05/18/20 1401  BP: 116/70 107/68  Pulse: 87 86  Resp: 16 16  Temp: 98.7 F (37.1 C) 97.8 F (36.6 C)  SpO2: 98% 98%   11/29- BP controlled- con't regimen 10.  Hyperlipidemia: Lipitor 11. Hiccups- Resolved  Continue baclofen 10 mg TID, decreased to 5 3 times daily on 11/17, d/ced on 11/18 12. Constipation  Bowel meds increased on 11/23, increased again 11/24  Improved 13.  Hypokalemia  Potassium 4.0 on 11/26 after supplementation, labs ordered for tomorrow  11/29- K+ 3.7- doing well 14.  Transaminitis  ALT remains elevated, but improving on 11/19, labs ordered for tomorrow  11/29- ALT still 61- no significant change 15.   Hypoalbuminemia  Supplement initiated on 11/15 16.?  CKD stage IIIa  GFR 51 on 11/26, labs ordered for tomorrow  Encourage fluids  11/29- basically no change Cr 1.34- con't to push PO  Continue to monitor 17.  Acute blood loss anemia  Hemoglobin 10.3 on 11/26  Continue to monitor 18.  Left iliopsoas strain  MRI suggesting muscle strain versus partial tear with tendinopathy  See #3 19.  Right knee pain  Likely OA  See #3  Requested patient to review with daughter type of brace that patient has that has been effective for him  11/29- pt likes old brace brought from home   LOS: 17 days A FACE TO FACE EVALUATION WAS  PERFORMED  Tyler Glass 05/18/2020, 6:16 PM

## 2020-05-18 NOTE — Progress Notes (Signed)
Physical Therapy Session Note  Patient Details  Name: Tyler Glass MRN: [id number] Date of Birth: Jul 24, 1933  Today's Date: 05/18/2020 PT Individual Time: 1105-1205 PT Individual Time Calculation (min): 60 min   Short Term Goals: Week 1:  PT Short Term Goal 1 (Week 1): Pt will consistently perform sit<>stand transfers with min assist PT Short Term Goal 2 (Week 1): Pt will consistently perform stand pivot transfers with min assist using LRAD PT Short Term Goal 3 (Week 1): Pt will ambulate at least 73ft using LRAD with mod assist PT Short Term Goal 4 (Week 1): Pt will initaite stair training Week 2:  PT Short Term Goal 1 (Week 2): Pt will perform sit<>stand transfers with CGA and LRAD PT Short Term Goal 2 (Week 2): Pt will perform bed<>chair transfers with supervision and LRAD PT Short Term Goal 3 (Week 2): Pt will ambulate 161ft with minA and LRAD PT Short Term Goal 4 (Week 2): Pt will initiate stair training Week 3:     Skilled Therapeutic Interventions/Progress Updates:    PAIN  States his knee was aching this am but since he put on his brace he is not having pain.  Pt initially oob in wc and agreeable to session. Pt transported to gym.    Gait x140 w/RW, cues to widen step width on R esp w/turning/tendency to lean to R mildly/lean increases w/reduced step width. Overall min assist.  Standing sidestepping single step L/R repeated w/emphasis on wt sifting to L and regaining midline between steps due to R lean/tendency, mod assist overall for balance, cues for safe pacing/recovery of balance, visual cues for midline.   Standing w/mirror for feed back worked on attempting to tap1in step on R to encourage wt shift to L, mult attempts, pt unable to [employer] RLE, adjusted task to tapping R , veers to R and max cues for attention to midline  Standing on foam first w/walker then no UE support w/mirror for feedback worked on wt shifting to L, max verbal and visual cues Able to wt shift  w/multimodal cues but unable to maintain more than briefly before drifting back to R. Repeated efforts allowed for increased ability to maintain/limited   Stood on floor w/no AD and mrror for feedback, cervical nods/rotation/UE overahead reach all w/increased AP sway but able to return to midline w/cga.      Gait:  Repeated gait as above x 126ft w/RW, initially improved midline and positioning of RLE at tinitial contact, however w/fatigue pt began to lean moderately to R/last 37ft of gait.  At end of session, pt transported to room and left oob in wc w/alarm set and needs in reach.    Therapy Documentation Precautions:  Precautions Precautions: Fall, Other (comment) Precaution Comments: R hemiparesis with strong R lean Restrictions Weight Bearing Restrictions: No   Therapy/Group: Individual Therapy  Rada Hay, PT   Shearon Balo 05/18/2020, 12:12 PM

## 2020-05-19 ENCOUNTER — Inpatient Hospital Stay (HOSPITAL_COMMUNITY): Payer: Medicare HMO

## 2020-05-19 ENCOUNTER — Inpatient Hospital Stay (HOSPITAL_COMMUNITY): Payer: Medicare HMO | Admitting: Occupational Therapy

## 2020-05-19 NOTE — Progress Notes (Signed)
[place] PHYSICAL MEDICINE & REHABILITATION PROGRESS NOTE   Subjective/Complaints: Patient seen sitting up, working with therapy this morning.  He states he slept well overnight.  He states his knee feels better with Voltaren gel as well as knee brace.  He states he appreciates the upgrade in his diet.  ROS: Denies CP, SOB, N/V/D  Objective:   No results found. No results for input(s): WBC, HGB, HCT, PLT in the last 72 hours. Recent Labs    05/18/20 0629  NA 139  K 3.7  CL 104  CO2 28  GLUCOSE 97  BUN 18  CREATININE 1.34*  CALCIUM 8.9    Intake/Output Summary (Last 24 hours) at 05/19/2020 1246 Last data filed at 05/19/2020 0726 Gross per 24 hour  Intake 1800 ml  Output 900 ml  Net 900 ml        Physical Exam: Vital Signs Blood pressure 105/70, pulse 80, temperature 98.6 F (37 C), temperature source Oral, resp. rate 18, height 6\' 1"  (1.854 m), weight 76.3 kg, SpO2 97 %.  Constitutional: No distress . Vital signs reviewed. HENT: Normocephalic.  Atraumatic. Eyes: EOMI. No discharge. Cardiovascular: No JVD.  RRR. Respiratory: Normal effort.  No stridor.  Bilateral clear to auscultation. GI: Non-distended.  BS +. Skin: Warm and dry.  Intact. Psych: Normal mood.  Normal behavior. Musc: No edema in extremities.  No tenderness in extremities. Neuro: Alert Moderate dysarthria, unchanged Motor: 5/5 throughout, unchanged Mild facial weakness   Assessment/Plan: 1. Functional deficits which require 3+ hours per day of interdisciplinary therapy in a comprehensive inpatient rehab setting.  Physiatrist is providing close team supervision and 24 hour management of active medical problems listed below.  Physiatrist and rehab team continue to assess barriers to discharge/monitor patient progress toward functional and medical goals  Care Tool:  Bathing    Body parts bathed by patient: Right arm, Left arm, Chest, Abdomen, Front perineal area, Right upper leg, Left upper  leg, Face, Buttocks, Left lower leg, Right lower leg   Body parts bathed by helper: Buttocks Body parts n/a: Left lower leg, Right lower leg   Bathing assist Assist Level: Supervision/Verbal cueing     Upper Body Dressing/Undressing Upper body dressing   What is the patient wearing?: Pull over shirt    Upper body assist Assist Level: Set up assist    Lower Body Dressing/Undressing Lower body dressing      What is the patient wearing?: Pants, Underwear/pull up     Lower body assist Assist for lower body dressing: Supervision/Verbal cueing     Toileting Toileting    Toileting assist Assist for toileting: Contact Guard/Touching assist Assistive Device Comment: urinal   Transfers Chair/bed transfer  Transfers assist     Chair/bed transfer assist level: Contact Guard/Touching assist     Locomotion Ambulation   Ambulation assist      Assist level: Minimal Assistance - Patient > 75% Assistive device: Walker-rolling Max distance: 200   Walk 10 feet activity   Assist  Walk 10 feet activity did not occur: N/A  Assist level: Minimal Assistance - Patient > 75% Assistive device: Walker-rolling   Walk 50 feet activity   Assist Walk 50 feet with 2 turns activity did not occur: Safety/medical concerns  Assist level: Minimal Assistance - Patient > 75% Assistive device: Walker-rolling    Walk 150 feet activity   Assist Walk 150 feet activity did not occur: N/A  Assist level: Minimal Assistance - Patient > 75% Assistive device: Walker-rolling  Walk 10 feet on uneven surface  activity   Assist Walk 10 feet on uneven surfaces activity did not occur: N/A         Wheelchair     Assist Will patient use wheelchair at discharge?:  (TBD)             Wheelchair 50 feet with 2 turns activity    Assist        Assist Level: Total Assistance - Patient < 25%   Wheelchair 150 feet activity     Assist          Medical Problem  List and Plan: 1.  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  Antithrombotics: -DVT/anticoagulation:   Lovenox changed to Eliquis  Doppler showing posterior tib/peroneal and gastroc DVTs left lower extremity.             -antiplatelet therapy: Aspirin 81 mg daily, Plavix 75 mg daily DC'd due to initiation of Eliquis, transition to ASA after 3 to 6 months per neurology 3. Pain Management: Tylenol as needed  Robaxin as needed  Voltaren gel ordered for right knee along with brace  Controlled on 11/30 4. Mood: Provide emotional support             -antipsychotic agents: N/A 5. Neuropsych: This patient is not fully capable of making decisions on his own behalf. 6. Skin/Wound Care: Routine skin checks 7. Fluids/Electrolytes/Nutrition: Routine in and outs 8.    Post stroke dysphagia.  Advanced to regular thins with intermittent supervision             Continue to advance diet as tolerated 9. Essential hypertension.  Patient on Norvasc 2.5 mg daily and Zestoretic 10-12.5 mg daily prior to admission.  Resume as needed   Vitals:   05/18/20 1930 05/19/20 0424  BP: 100/68 105/70  Pulse: 87 80  Resp: 17 18  Temp: 98.8 F (37.1 C) 98.6 F (37 C)  SpO2: 99% 97%   Soft on 11/30, off hypertensive meds 10.  Hyperlipidemia: Lipitor 11. Hiccups- Resolved  Continue baclofen 10 mg TID, decreased to 5 3 times daily on 11/17, d/ced on 11/18  Discussed with pharmacy, Thorazine DC'd on 11/30 12. Constipation  Bowel meds increased on 11/23, increased again 11/24  Improved 13.  Hypokalemia  Potassium 3.7 on 11/29 14.  Transaminitis  ALT remains elevated, but stable on 11/29 15.  Hypoalbuminemia  Supplement initiated on 11/15 16.?  CKD stage IIIa  GFR 52 on 11/29  Encourage fluids  Continue to monitor 17.  Acute blood loss anemia  Hemoglobin 10.3 on 11/26  Continue to monitor 18.  Left iliopsoas strain  MRI suggesting muscle strain versus partial tear  with tendinopathy  See #3 19.  Right knee pain  Likely OA  See #3  Requested patient to review with daughter type of brace that patient has that has been effective for him  Improving  LOS: 18 days A FACE TO FACE EVALUATION WAS PERFORMED  Johnette Teigen [name] 05/19/2020, 12:46 PM

## 2020-05-19 NOTE — Progress Notes (Signed)
Speech Language Pathology Daily Session Note  Patient Details  Name: Hulet Ehrmann MRN: [id number] Date of Birth: July 30, 1933  Today's Date: 05/19/2020 SLP Individual Time: 8563-1497 SLP Individual Time Calculation (min): 57 min  Short Term Goals: Week 3: SLP Short Term Goal 1 (Week 3): STG=LTG due to short ELOS  Skilled Therapeutic Interventions:Skilled ST services focused on cognitive skills. SLP facilitated education of recall strategies including visualization, association and chunky in various tasks. Following demonstration pt required max A fade to mod A verbal cues to utilize visualization when recalling details of picture scenes. Pt was able to recall 11 out 12 pictures utilizing chunking and association and ability to chuck items into 3 categories with supervision A verbal cues. Pt required mod A semantic cues for word finding of category names. Pt was left in room with call bell within reach and chair alarm set. SLP recommends to continue skilled services.     Pain Pain Assessment Pain Score: 0-No pain  Therapy/Group: Individual Therapy  Adamariz Gillott  [facility] 05/19/2020, 5:23 PM

## 2020-05-19 NOTE — Progress Notes (Signed)
Occupational Therapy Session Note  Patient Details  Name: Tyler Glass MRN: [id number] Date of Birth: December 04, 1933  Today's Date: 05/19/2020 OT Individual Time: 1000-1058 OT Individual Time Calculation (min): 58 min    Short Term Goals: Week 3:  OT Short Term Goal 1 (Week 3): STGs=LTGs due to ELOS  Skilled Therapeutic Interventions/Progress Updates:    Pt sitting up in w/c, no c/o pain, agreeable to OT session.  Pt completed sit to stand with VCs to ensure safe hand placement due to attempting to stand with BUE on RW.  Pt ambulated with RW to sink and brushed teeth and washed face in standing with supervision.  Pt doffed shirt seated at w/c and bathed UB with mod I.  Pt doffed/donned pants and boxers and bathed LB with supervision at sit/stand level.  Pt ambulated to EOB with RW with CGA.  Pt called daughter with mod I to collaborate with OT to set up family education date and time.  Pt requesting back to bed at end of session. Sit to supine with supervision. Call bell in reach, bed alarm on.  Improved midline awareness today needing only supervision during standing functional tasks.   Therapy Documentation Precautions:  Precautions Precautions: Fall, Other (comment) Precaution Comments: R hemiparesis with strong R lean Restrictions Weight Bearing Restrictions: No   Therapy/Group: Individual Therapy  Amie Critchley 05/19/2020, 12:11 PM

## 2020-05-19 NOTE — Progress Notes (Signed)
Physical Therapy Weekly Progress Note  Patient Details  Name: Tyler Glass MRN: [id number] Date of Birth: 1934-05-21  Beginning of progress report period: May 11, 2020 End of progress report period: May 19, 2020  Today's Date: 05/19/2020 PT Individual Time: 1696-7893 + 8101-7510 PT Individual Time Calculation (min): 45 min + 28 min  Patient has met 4 of 4 short term goals. Pt is making appropriate progress towards goals. He can complete bed mobility with supervision, squat<>pivot transfers with close supervision, sit<>stand transfers with CGA and RW, and can ambulate ~12f with minA and RW while on level surfaces. He has been able to negotiate up/down 12 steps with modA with 2 hand rails but he did have x1 instance of R knee buckling requiring maxA for recovery while negotiating up steps. He also continues to demo R push/lean in standing or with functional tasks in standing but has showed improved ability to correct and become aware of his midline deficits.  Patient continues to demonstrate the following deficits muscle weakness, impaired timing and sequencing, unbalanced muscle activation, decreased coordination and decreased motor planning, decreased attention to right and decreased motor planning, decreased attention, decreased awareness, decreased problem solving, decreased safety awareness, decreased memory and delayed processing and decreased standing balance, decreased postural control and decreased balance strategies and therefore will continue to benefit from skilled PT intervention to increase functional independence with mobility.  Patient progressing toward long term goals.  Continue plan of care.  PT Short Term Goals Week 2:  PT Short Term Goal 1 (Week 2): Pt will perform sit<>stand transfers with CGA and LRAD PT Short Term Goal 2 (Week 2): Pt will perform bed<>chair transfers with supervision and LRAD PT Short Term Goal 3 (Week 2): Pt will ambulate 1036fwith minA and  LRAD PT Short Term Goal 4 (Week 2): Pt will initiate stair training Week 3:  PT Short Term Goal 1 (Week 3): STG = LTG due to ELOS  Skilled Therapeutic Interventions/Progress Updates:     1st session: Pt greeted supine in bed, awake and agreeable to PT session without reports of pain,./; He reports his daughter has brought him tennis shoes and his R knee brace (neoprene sleeve). MD is aware of brace and is agreeable to wearing for mobility. He requests to wash up and brush his teeth. Provided him with soaped washcloth where he performed frontal pericare with setupA. He donned boxer shorts and pajama pants with setupA while supine in bed; Supine to sit with supervision with bed features. He was able to don B shoes and tie them with supervision while seated EOB. Sit to stand with CGA from EOB to RW and he ambulated with CGA sinkside where he brushed his teeth with CGA/minA guard in standing. Ambulated an additional ~1040fith minA and RW to his w/c. He then propelled himself with supervision in w/c >150f41fr purposes of strengthening activity tolerance. He performed car transfer with minA via stand pivot technique, cues for hand placement and general sequencing. W/c transport to main therapy gym and performed squat<>pivot with close supervision from w/c to mat table. Performed NMR for dynamic standing balance, using RW and minA guard performing coordinated toe taps on each leg. He shows improved RLE coordination compared to previous sessions! He then ambulated ~200ft67fh minA and RW on level surfaces, cues for widening BOS and RW management. Noted increased unsteadiness with turns and requires cues for paced turning and safety awareness. He was returned to his room in his w/c where he remained  seated in w/c with chair alarm on, needs in reach.  2nd session: Pt received sitting on toilet, NT present and hand-off of care performed. Pt continent of bowel/bladder, charted. NT using stedy for transfer assist,  therefore continued Stedy for transfer off toilet. Pt able to stand with CGA in Stedy and used washcloth for posterior pericare with CGA. Stedy transfer to w/c and then pt was wheeled sinkside where he washed his hands while seated in w/c. Pt propelled himself 146f in w/c using BUE's, for strengthening activity tolerance. W/c transport remaining distance for time management. Focused remainder of session on gait training with obstacle course. Performed sit<>stand with CGA from w/c to RW with cues for BUE placement. He required modA for standing balance due to R lean/push. Pt also with difficulty motor planning and sequencing the obstacle course. Obstacle course included stepping over x5 narrow sticks and weaving in/out of 2 cones. Pt was returned to his room in his w/c where he remained seated in chair with needs in reach, chair alarm on.   Therapy Documentation Precautions:  Precautions Precautions: Fall, Other (comment) Precaution Comments: R hemiparesis with strong R lean Restrictions Weight Bearing Restrictions: No  Therapy/Group: Individual Therapy  Andru Genter P Edel Rivero PT 05/19/2020, 12:42 PM

## 2020-05-19 NOTE — Plan of Care (Signed)
  Problem: RH Stairs Goal: LTG Patient will ambulate up and down stairs w/assist (PT) Description: LTG: Patient will ambulate up and down # of stairs with assistance (PT) Flowsheets (Taken 05/19/2020 1258) LTG: Pt will ambulate up/down stairs assist needed:: Minimal Assistance - Patient > 75% LTG: Pt will  ambulate up and down number of stairs: 4 Note: Downgraded due to slow progress

## 2020-05-20 ENCOUNTER — Inpatient Hospital Stay (HOSPITAL_COMMUNITY): Payer: Medicare HMO

## 2020-05-20 ENCOUNTER — Inpatient Hospital Stay (HOSPITAL_COMMUNITY): Payer: Medicare HMO | Admitting: Occupational Therapy

## 2020-05-20 ENCOUNTER — Inpatient Hospital Stay (HOSPITAL_COMMUNITY): Payer: Medicare HMO | Admitting: Physical Therapy

## 2020-05-20 NOTE — Progress Notes (Signed)
[facility] PHYSICAL MEDICINE & REHABILITATION PROGRESS NOTE   Subjective/Complaints: Patient seen sitting up in bed this morning. He states he slept well overnight. He states his knee feels good.  ROS: Denies CP, SOB, N/V/D  Objective:   No results found. No results for input(s): WBC, HGB, HCT, PLT in the last 72 hours. Recent Labs    05/18/20 0629  NA 139  K 3.7  CL 104  CO2 28  GLUCOSE 97  BUN 18  CREATININE 1.34*  CALCIUM 8.9    Intake/Output Summary (Last 24 hours) at 05/20/2020 0921 Last data filed at 05/20/2020 0734 Gross per 24 hour  Intake 720 ml  Output 425 ml  Net 295 ml        Physical Exam: Vital Signs Blood pressure 119/85, pulse 75, temperature 98.4 F (36.9 C), resp. rate 16, height 6\' 1"  (1.854 m), weight 76.3 kg, SpO2 95 %.  Constitutional: No distress . Vital signs reviewed. HENT: Normocephalic.  Atraumatic. Eyes: EOMI. No discharge. Cardiovascular: No JVD.  RRR. Respiratory: Normal effort.  No stridor.  Bilateral clear to auscultation. GI: Non-distended.  BS +. Skin: Warm and dry.  Intact. Psych: Normal mood.  Normal behavior. Musc: No edema in extremities.  No tenderness in extremities. Neuro: Alert Moderate dysarthria, unchanged Motor: 5/5 throughout, stable Mild facial weakness   Assessment/Plan: 1. Functional deficits which require 3+ hours per day of interdisciplinary therapy in a comprehensive inpatient rehab setting.  Physiatrist is providing close team supervision and 24 hour management of active medical problems listed below.  Physiatrist and rehab team continue to assess barriers to discharge/monitor patient progress toward functional and medical goals  Care Tool:  Bathing    Body parts bathed by patient: Right arm, Left arm, Chest, Abdomen, Front perineal area, Right upper leg, Left upper leg, Face, Buttocks, Left lower leg, Right lower leg   Body parts bathed by helper: Buttocks Body parts n/a: Left lower leg, Right lower  leg   Bathing assist Assist Level: Supervision/Verbal cueing     Upper Body Dressing/Undressing Upper body dressing   What is the patient wearing?: Pull over shirt    Upper body assist Assist Level: Set up assist    Lower Body Dressing/Undressing Lower body dressing      What is the patient wearing?: Pants, Underwear/pull up     Lower body assist Assist for lower body dressing: Supervision/Verbal cueing     Toileting Toileting    Toileting assist Assist for toileting: Contact Guard/Touching assist Assistive Device Comment: urinal   Transfers Chair/bed transfer  Transfers assist     Chair/bed transfer assist level: Contact Guard/Touching assist     Locomotion Ambulation   Ambulation assist      Assist level: Minimal Assistance - Patient > 75% Assistive device: Walker-rolling Max distance: 200   Walk 10 feet activity   Assist  Walk 10 feet activity did not occur: N/A  Assist level: Minimal Assistance - Patient > 75% Assistive device: Walker-rolling   Walk 50 feet activity   Assist Walk 50 feet with 2 turns activity did not occur: Safety/medical concerns  Assist level: Minimal Assistance - Patient > 75% Assistive device: Walker-rolling    Walk 150 feet activity   Assist Walk 150 feet activity did not occur: N/A  Assist level: Minimal Assistance - Patient > 75% Assistive device: Walker-rolling    Walk 10 feet on uneven surface  activity   Assist Walk 10 feet on uneven surfaces activity did not occur: N/A  Wheelchair     Assist Will patient use wheelchair at discharge?:  (TBD)             Wheelchair 50 feet with 2 turns activity    Assist        Assist Level: Total Assistance - Patient < 25%   Wheelchair 150 feet activity     Assist          Medical Problem List and Plan: 1.  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 Please see conference note from today as well.  2.  Antithrombotics: -DVT/anticoagulation:   Lovenox changed to Eliquis  Doppler showing posterior tib/peroneal and gastroc DVTs left lower extremity.             -antiplatelet therapy: Aspirin 81 mg daily, Plavix 75 mg daily DC'd due to initiation of Eliquis, transition to ASA after 3 to 6 months per neurology 3. Pain Management: Tylenol as needed  Robaxin as needed  Voltaren gel ordered for right knee along with brace  Controlled on 12/1 4. Mood: Provide emotional support             -antipsychotic agents: N/A 5. Neuropsych: This patient is not fully capable of making decisions on his own behalf. 6. Skin/Wound Care: Routine skin checks 7. Fluids/Electrolytes/Nutrition: Routine in and outs 8. Post stroke dysphagia.    Advanced to regular thins with intermittent supervision             Continue to advance diet as tolerated 9. Essential hypertension.  Patient on Norvasc 2.5 mg daily and Zestoretic 10-12.5 mg daily prior to admission.  Resume as needed   Vitals:   05/19/20 1929 05/20/20 0627  BP: 113/70 119/85  Pulse: 78 75  Resp: 18 16  Temp: 98 F (36.7 C) 98.4 F (36.9 C)  SpO2: 100% 95%   Controlled on 12/1 10.  Hyperlipidemia: Lipitor 11. Hiccups- Resolved  Continue baclofen 10 mg TID, decreased to 5 3 times daily on 11/17, d/ced on 11/18  Discussed with pharmacy, Thorazine DC'd on 11/30 12. Constipation  Bowel meds increased on 11/23, increased again 11/24  Improved 13.  Hypokalemia  Potassium 3.7 on 11/29 14.  Transaminitis  ALT remains elevated, but stable on 11/29 15.  Hypoalbuminemia  Supplement initiated on 11/15 16.?  CKD stage IIIa  GFR 52 on 11/29  Encourage fluids  Continue to monitor 17.  Acute blood loss anemia  Hemoglobin 10.3 on 11/26  Continue to monitor 18.   Left iliopsoas strain  MRI suggesting muscle strain versus partial tear with tendinopathy  See #3 19.  Right knee pain  Likely OA  See #3  Requested patient to review with daughter type of brace that patient has that has been effective for him  Improved  LOS: 19 days A FACE TO FACE EVALUATION WAS PERFORMED  Tyler Glass Tyler Glass 05/20/2020, 9:21 AM

## 2020-05-20 NOTE — Progress Notes (Signed)
Physical Therapy Session Note  Patient Details  Name: Tyler Glass MRN: [id number] Date of Birth: February 01, 1934  Today's Date: 05/20/2020 PT Individual Time: 1625-1720 PT Individual Time Calculation (min): 55 min   Short Term Goals: Week 3:  PT Short Term Goal 1 (Week 3): STG = LTG due to ELOS  Skilled Therapeutic Interventions/Progress Updates:    Pt received supine in bed and agreeable to PT. Pt performed supine to sit on R EOB with supervision and squat pivot to WC on R side with min assist.   Gait Training: -Pt transported to atrium total assist where he completed ~67f of ambulation in breezeway with min assist. Pt requiring constant verbal cues in order to keep R foot away from midline and maintain upright posture without R lateral lean. Pt with noted difficulty clearing R foot due to lack of weight shift onto LLE.  [facility] Pt ambulated ~1142fthroughout gift shop, having to follow directions from PT for navigation. Throughout, pt able to navigate tight spaces between displays, however exhibits greater R lateral lean in tighter areas. Pt requiring min assist to correct posture during ambulation, however when asked to stop and straighten up, able to self correct without facilitation. [facility]  Step-overs: - In atrium, pt instructed to stand and step over a line in front of his R foot into the next box on the floor in order to promote R hip flexion and weight shift onto LLE. Pt requiring min assist for weight shift and requires external cues to place foot on in order to complete task (2x15) -with L foot, pt able to perform with CGA (1x15)  Pt transported to rehab wing in elevator. Upon exiting the elevator, pt propelled WC ~12055fo his room. Pt left sitting in WC with call bell in reach, chair alarm on, and needs met. Pt did not report pain during this session.    Therapy Documentation Precautions:  Precautions Precautions: Fall, Other  (comment) Precaution Comments: R hemiparesis with strong R lean Restrictions Weight Bearing Restrictions: No Pain: Pain Assessment Pain Score: 0-No pain    Therapy/Group: Individual Therapy  BaiGaylord ShihSPT 05/20/2020, 5:50 PM

## 2020-05-20 NOTE — Patient Care Conference (Signed)
Inpatient RehabilitationTeam Conference and Plan of Care Update Date: 05/20/2020   Time: 11:17 AM    Patient Name: Tyler Glass      Medical Record Number: [id number]  Date of Birth: 03/18/1934 Sex: Male         Room/Bed: 4M07C/4M07C-01 Payor Info: Payor: [employer] MEDICARE / Plan: [employer] MEDICARE HMO / Product Type: *No Product type* /    Admit Date/Time:  05/01/2020  5:09 PM  Primary Diagnosis:  CVA (cerebral vascular accident) [facility])  Hospital Problems: Principal Problem:   CVA (cerebral vascular accident) (HCC) Active Problems:   Acute blood loss anemia   Hypoalbuminemia due to protein-calorie malnutrition (HCC)   Hypokalemia   Essential hypertension   Transaminitis   Hiccups   AKI (acute kidney injury) (HCC)   Stage 3 chronic kidney disease (HCC)   Acute deep vein thrombosis (DVT) of tibial vein of left lower extremity (HCC)   Strain of left iliopsoas muscle   Chronic pain of right knee    Expected Discharge Date: Expected Discharge Date: 05/25/20  Team Members Present: Physician leading conference: Dr. Maryla Morrow Care Coodinator Present: Chana Bode, RN, BSN, CRRN;Becky Dupree, LCSW Nurse Present: Margot Ables, LPN PT Present: Wynelle Link, PT OT Present: Dolphus Jenny, OT SLP Present: Colin Benton, SLP PPS Coordinator present : Fae Pippin, [place], PT     Current Status/Progress Goal Weekly Team Focus  Bowel/Bladder   cont of b and b lbm 05/19/20  maintain continence   assess toiletting, empty urinals    Swallow/Nutrition/ Hydration   regular textures and thin liquids Mod I  Mod I  education and carryover of swallow strategies   ADL's   CGA/supervision functional transfers, setup UB ADLs, CGA/sup LB ADLs  CGA  standing balance, self care training, functional transfers   Mobility   supervision/mod I bed mobility. CGA/supervision squat<>pivot transfers. Gait 182ft minA (occasional modA) with RW. Continues to show moderate R pushing  CGA   Midline awareness, gait training, dynamic standing balance, family training scheduled on Friday with daughter   Communication   Mod I  Mod I  education and carryover of strategies   Safety/Cognition/ Behavioral Observations  mod-min A  Min A - downgraded  education and carryover of memory and basic problem solving strageties   Pain   pt c/o pain in right kne level 8/10 compares it to pins and needles   decrease pain 0/10  assess q shidt and prn medicate as needed    Skin   no cureent skn issues   remain free of skin breakdown   assess q shift and orn      Discharge Planning:  Daughter coming n Friday for education, aware Dad will need 24/7 care. Prepare for discharge next week   Team Discussion: Knee pain treated with voltaren gel per MD. Note improved midline awareness, increased safety awareness but continues to push right with standing.  Patient on target to meet rehab goals: yes, currently supervision - CGA with self care and ambulated 150' with a rolling walker and CBA -Min assist; right lean noted with activity.   *See Care Plan and progress notes for long and short-term goals.   Revisions to Treatment Plan:  Review memory strategies Teaching Needs:   Current Barriers to Discharge: Decreased caregiver support  Possible Resolutions to Barriers: Encourage daughter to take on more control of care/appointments, money management, etc. Daughter working on 24/7 care Family education     Medical Summary Current Status: Dysarthria/dysphagia secondary to right lateral  medullary infarct  Barriers to Discharge: Medical stability   Possible Resolutions to Barriers/Weekly Focus: Therapies, follow BP, optimize pain meds, follow labs - Cr, K+, follow BPs, anticoag for DVT   Continued Need for Acute Rehabilitation Level of Care: The patient requires daily medical management by a physician with specialized training in physical medicine and rehabilitation for the following  reasons: Direction of a multidisciplinary physical rehabilitation program to maximize functional independence : Yes Medical management of patient stability for increased activity during participation in an intensive rehabilitation regime.: Yes Analysis of laboratory values and/or radiology reports with any subsequent need for medication adjustment and/or medical intervention. : Yes   I attest that I was present, lead the team conference, and concur with the assessment and plan of the team.   Chana Bode B 05/20/2020, 3:53 PM

## 2020-05-20 NOTE — Progress Notes (Signed)
Occupational Therapy Session Note  Patient Details  Name: Tyler Glass MRN: [id number] Date of Birth: Mar 07, 1934  Today's Date: 05/20/2020 OT Individual Time: 6144-3154 OT Individual Time Calculation (min): 58 min    Short Term Goals: Week 3:  OT Short Term Goal 1 (Week 3): STGs=LTGs due to ELOS  Skilled Therapeutic Interventions/Progress Updates:    Pt sitting up with dtr present.  Pt had no c/o pain and agreeable to washing up at sinkside.  Educated pts dtr regarding pts current functional status.  Pt ambulated to sink with CGA using RW.  UB bathing and dressing completed with setup.  LB bathing and dressing completed with supervision.  Pt requesting to use bathroom.  Ambulated to bathroom and completed toilet transfer with CGA and VCs for sequencing of turning in place.  Pt completed toileting with CGA and had continent episode of bowel.  Pt ambulated to EOB with CGA and sit to supine with supervision.  Call bell in reach, bed alarm on.  Therapy Documentation Precautions:  Precautions Precautions: Fall, Other (comment) Precaution Comments: R hemiparesis with strong R lean Restrictions Weight Bearing Restrictions: No     Therapy/Group: Individual Therapy  Amie Critchley 05/20/2020, 4:30 PM

## 2020-05-20 NOTE — Progress Notes (Signed)
Physical Therapy Session Note  Patient Details  Name: Tyler Glass MRN: [id number] Date of Birth: 1934-06-17  Today's Date: 05/20/2020 PT Individual Time: 1000-1056 PT Individual Time Calculation (min): 56 min   Short Term Goals: Week 3:  PT Short Term Goal 1 (Week 3): STG = LTG due to ELOS  Skilled Therapeutic Interventions/Progress Updates:    Pt greeted supine in bed, agreeable to PT session, no reports of pain. Supine<>sit with supervision with bed features. Provided socks and shoes where he donned with setupA. Noted increased difficulty with RLE management and posterior/R lateral lean in sitting. Aware of decreased sitting balance and able to correct with cues. Performed sit<>stand with CGA/minA from EOB to RW and he ambulated sinkside as pt was requesting to brush teeth and wash his face with therapist providin gminA guard for balance. He ambulated with minA and RW back to his w/c and he propelled himself >240ft with supervision in w/c to ortho gym. He completed sit<>stand with minA and no AD and was instructed to complete a few activities using BITS system. Performed NMR for dual cognitive tasks and functional reaching in standing. He completed the Maze Test x5 attempts. The 1st attempt he scored 54 errors. He had significant difficutly comprehending the Maze and understanding the rule/object of the maze. He often would just trace the maze outline and also had difficulty sequencing the events to leading to finishing the maze. Therapist provided demonstration which appeared to help but limited carryover. He also completed the Trail Making activity with sequencing from 1-25 which he completed with 0 errors but had 25 interceptions. He then ambulated >236ft with mostly minA and RW (regressing to light modA after 270ft) on level surfaces with cues provided for widening BOS, RW management, and safety awareness. He returned to his room where he remained seated in w/c with needs in reach, chair alarm  on.  Therapy Documentation Precautions:  Precautions Precautions: Fall, Other (comment) Precaution Comments: R hemiparesis with strong R lean Restrictions Weight Bearing Restrictions: No  Therapy/Group: Individual Therapy  Brehanna Deveny P Nychelle Cassata PT 05/20/2020, 10:58 AM

## 2020-05-20 NOTE — Progress Notes (Signed)
Speech Language Pathology Daily Session Note  Patient Details  Name: Tyler Glass MRN: [id number] Date of Birth: 1934-04-22  Today's Date: 05/20/2020 SLP Individual Time: 0802-0830 SLP Individual Time Calculation (min): 28 min  Short Term Goals: Week 3: SLP Short Term Goal 1 (Week 3): STG=LTG due to short ELOS  Skilled Therapeutic Interventions:Skilled ST services focused on cognitive skills. SLP educated pt on today's therapy schedule, pt demonstrated max difficulty reading even rewritten schedule. SLP encoraged pt to utilize recall strategies of visualization to recall sequence of events. Pt was able to recall therapy schedule with mod A verbal cues at the end of the session. SLP facilitated use of visualization, written/picture aid and association in sentence form, to recall 5 novel words. Pt demonstrated immediate recall of 3 out 5 words and after 10 minutes of attempting store information with the various strategies listed above, pt was able to recall 5 out 5 words with a 5 minute delay. SLP educated pt in storage deficit in recall and need to provide visual and auditory information to imprint the short term memory in his mind. Pt stated understanding. Pt was left in room with call bell within reach and bed alarm set. SLP recommends to continue skilled services     Pain Pain Assessment Pain Score: 0-No pain  Therapy/Group: Individual Therapy  Tyler Glass  [facility] 05/20/2020, 4:07 PM

## 2020-05-20 NOTE — Plan of Care (Signed)
  Problem: RH Problem Solving Goal: LTG Patient will demonstrate problem solving for (SLP) Description: LTG:  Patient will demonstrate problem solving for basic/complex daily situations with cues  (SLP) 05/20/2020 0822 by Colin Benton, CCC-SLP Flowsheets (Taken 05/20/2020 [phone]) LTG Patient will demonstrate problem solving for: (level not added earlier in errr) -- Note: Level Not added earlier in error  05/20/2020 0821 by Colin Benton, CCC-SLP Flowsheets (Taken 05/20/2020 [phone]) LTG Patient will demonstrate problem solving for: Minimal Assistance - Patient > 75%

## 2020-05-20 NOTE — Progress Notes (Signed)
Patient ID: Tyler Glass, male   DOB: 10-06-1933, 86 y.o.   MRN: [id number]  Met with pt and daughter was present in his room to update regarding team conference and progression toward his goals and moving up discharge date to 12/6. Daughter coming in Friday for education and is preparing for his discharge to her home. Both agreeable to rw and 3 in 1 and home health. Will work on discharge needs and see on Friday.

## 2020-05-21 ENCOUNTER — Inpatient Hospital Stay (HOSPITAL_COMMUNITY): Payer: Medicare HMO

## 2020-05-21 ENCOUNTER — Inpatient Hospital Stay (HOSPITAL_COMMUNITY): Payer: Medicare HMO | Admitting: Occupational Therapy

## 2020-05-21 ENCOUNTER — Inpatient Hospital Stay (HOSPITAL_COMMUNITY): Payer: Medicare HMO | Admitting: Speech Pathology

## 2020-05-21 NOTE — Progress Notes (Signed)
Physical Therapy Session Note  Patient Details  Name: Tyler Glass MRN: [id number] Date of Birth: May 16, 1934  Today's Date: 05/21/2020 PT Individual Time: 2505-3976 PT Individual Time Calculation (min): 60 min   Short Term Goals: Week 3:  PT Short Term Goal 1 (Week 3): STG = LTG due to ELOS  Skilled Therapeutic Interventions/Progress Updates:    Pt received supine in bed, awake and agreeable to session without reports of pain. Perfromed supine<>sit with supervision with bed features and donned tennis shoes with setupA while seated EOB. He request to brush teeth, so he ambulated with minA and RW sinkside where he washed his face and brushed his teeth with CGA/minA guard. Used mirror occasionally for visual feedback to reduce R lateral lean. He ambulated to his w/c within his room with minA and RW and required mod cues for safety awareness for approaching his w/c as he tends to favor his R side with sitting (almost sitting on arm rest). He propelled himself >253ft with distant supervision in his w/c from his room to day room gym. Performed squat<>pivot transfer with close supervision from w/c to mat table. Completed sit<>stands with CGA and no AD and performed picking up cones from standing position with minA due to posterior bias. He completed this several times with both LUE and RUE. He then completed dynamic standing balance with large therapy ball, performing over head reaching, thoracic rotations, and forward reaching while holding onto ball and requiring minA for standing balance. He then completed NMR on blue air-ex foam pad, unsupported standing with minA guard with eyes open and eyes closed with feet shldr width apart. Cues throughout for balance strategies and reducing R lateral lean. Then completed gait training with obstacle course, weaving in/out of 2x8 cones that were spaced ~41ft apart, with no AD. He initially required minA, regressing to modA towards the end and continues to show R lateral  lean and R foot slap with narrow BOS. He completed the same obstacle course with RW and he required CGA (regresing to minA towards the end). Gait speed was decreased but improved safety, especially with turns. Cues for widening BOS, reducing R lateral lean, RW management, and sequencing. Pt then completed standing BLE coordination training with RW support and minA guard, performing toe taps on cones with unilateral repetitions on both LLE and RLE. Significantly increased difficulty with RLE due to ataxia and R lean worsened with this as well. Cues provided for corrections, L weight shift, and balance strategies. Squat<>pivot with close supervision from mat table to w/c and he propelled himself in w/c back to his room where he remained seated in w/c with chair alarm on and needs in reach.   Therapy Documentation Precautions:  Precautions Precautions: Fall, Other (comment) Precaution Comments: R hemiparesis with strong R lean Restrictions Weight Bearing Restrictions: No  Therapy/Group: Individual Therapy  Latorsha Curling P Colt Martelle PT 05/21/2020, 7:28 AM

## 2020-05-21 NOTE — Progress Notes (Signed)
[facility] [facility] PHYSICAL MEDICINE & REHABILITATION PROGRESS NOTE   Subjective/Complaints: Patient seen sitting up in bed this morning.  He states he slept well overnight.  He is confused regarding his discharge date.  He states that he feels much better without hiccups.  ROS: Denies CP, SOB, N/V/D  Objective:   No results found. No results for input(s): WBC, HGB, HCT, PLT in the last 72 hours. No results for input(s): NA, K, CL, CO2, GLUCOSE, BUN, CREATININE, CALCIUM in the last 72 hours.  Intake/Output Summary (Last 24 hours) at 05/21/2020 0810 Last data filed at 05/21/2020 0700 Gross per 24 hour  Intake 1020 ml  Output 1325 ml  Net -305 ml        Physical Exam: Vital Signs Blood pressure 112/73, pulse 82, temperature 98.1 F (36.7 C), resp. rate 20, height 6\' 1"  (1.854 m), weight 76.3 kg, SpO2 97 %.  Constitutional: No distress . Vital signs reviewed. HENT: Normocephalic.  Atraumatic. Eyes: EOMI. No discharge. Cardiovascular: No JVD.  RRR. Respiratory: Normal effort.  No stridor.  Bilateral clear to auscultation. GI: Non-distended.  BS +. Skin: Warm and dry.  Intact. Psych: Normal mood.  Normal behavior. Musc: No edema in extremities.  No tenderness in extremities. Neuro: Alert Moderate dysarthria, stable Motor: 5/5 throughout, stable Mild facial weakness   Assessment/Plan: 1. Functional deficits which require 3+ hours per day of interdisciplinary therapy in a comprehensive inpatient rehab setting.  Physiatrist is providing close team supervision and 24 hour management of active medical problems listed below.  Physiatrist and rehab team continue to assess barriers to discharge/monitor patient progress toward functional and medical goals  Care Tool:  Bathing    Body parts bathed by patient: Right arm, Left arm, Chest, Abdomen, Front perineal area, Right upper leg, Left upper leg, Face, Buttocks, Left lower leg, Right lower leg   Body parts bathed by helper:  Buttocks Body parts n/a: Left lower leg, Right lower leg   Bathing assist Assist Level: Supervision/Verbal cueing     Upper Body Dressing/Undressing Upper body dressing   What is the patient wearing?: Pull over shirt    Upper body assist Assist Level: Set up assist    Lower Body Dressing/Undressing Lower body dressing      What is the patient wearing?: Pants, Underwear/pull up     Lower body assist Assist for lower body dressing: Supervision/Verbal cueing     Toileting Toileting    Toileting assist Assist for toileting: Contact Guard/Touching assist Assistive Device Comment: urinal   Transfers Chair/bed transfer  Transfers assist     Chair/bed transfer assist level: Contact Guard/Touching assist     Locomotion Ambulation   Ambulation assist      Assist level: Minimal Assistance - Patient > 75% Assistive device: Walker-rolling Max distance: 200   Walk 10 feet activity   Assist  Walk 10 feet activity did not occur: N/A  Assist level: Minimal Assistance - Patient > 75% Assistive device: Walker-rolling   Walk 50 feet activity   Assist Walk 50 feet with 2 turns activity did not occur: Safety/medical concerns  Assist level: Minimal Assistance - Patient > 75% Assistive device: Walker-rolling    Walk 150 feet activity   Assist Walk 150 feet activity did not occur: N/A  Assist level: Minimal Assistance - Patient > 75% Assistive device: Walker-rolling    Walk 10 feet on uneven surface  activity   Assist Walk 10 feet on uneven surfaces activity did not occur: N/A  Wheelchair     Assist Will patient use wheelchair at discharge?:  (TBD)             Wheelchair 50 feet with 2 turns activity    Assist        Assist Level: Total Assistance - Patient < 25%   Wheelchair 150 feet activity     Assist          Medical Problem List and Plan: 1.  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  Antithrombotics: -DVT/anticoagulation:   Lovenox changed to Eliquis  Doppler showing posterior tib/peroneal and gastroc DVTs left lower extremity.             -antiplatelet therapy: Aspirin 81 mg daily, Plavix 75 mg daily DC'd due to initiation of Eliquis, transition to ASA after 3 to 6 months per neurology 3. Pain Management: Tylenol as needed  Robaxin as needed  Voltaren gel ordered for right knee along with brace  Controlled on 12/2 4. Mood: Provide emotional support             -antipsychotic agents: N/A 5. Neuropsych: This patient is not fully capable of making decisions on his own behalf. 6. Skin/Wound Care: Routine skin checks 7. Fluids/Electrolytes/Nutrition: Routine in and outs 8. Post stroke dysphagia.    Advanced to regular thins with intermittent supervision             Continue to advance diet as tolerated 9. Essential hypertension.  Patient on Norvasc 2.5 mg daily and Zestoretic 10-12.5 mg daily prior to admission.  Resume as needed   Vitals:   05/20/20 1929 05/21/20 0429  BP: 119/73 112/73  Pulse: 78 82  Resp: 16 20  Temp: 97.8 F (36.6 C) 98.1 F (36.7 C)  SpO2: 99% 97%   Controlled on 12/2 10.  Hyperlipidemia: Lipitor 11. Hiccups- Resolved  Continue baclofen 10 mg TID, decreased to 5 3 times daily on 11/17, d/ced on 11/18  Discussed with pharmacy, Thorazine DC'd on 11/30 12. Constipation  Bowel meds increased on 11/23, increased again 11/24  Improved 13.  Hypokalemia  Potassium 3.7 on 11/29 14.  Transaminitis  ALT remains elevated, but stable on 11/29 15.  Hypoalbuminemia  Supplement initiated on 11/15 16.?  CKD stage IIIa  GFR 52 on 11/29  Encourage fluids  Continue to monitor 17.  Acute blood loss anemia  Hemoglobin 10.3 on 11/26, labs ordered for tomorrow  Continue to monitor 18.  Left iliopsoas strain  MRI suggesting muscle strain versus partial tear with tendinopathy  See #3 19.  Right  knee pain  Likely OA  See #3  Requested patient to review with daughter type of brace that patient has that has been effective for him  Improved  LOS: 20 days A FACE TO FACE EVALUATION WAS PERFORMED  Monish Haliburton [name] 05/21/2020, 8:10 AM

## 2020-05-21 NOTE — Plan of Care (Signed)
  Problem: Consults Goal: RH STROKE PATIENT EDUCATION Description: See Patient Education module for education specifics  Outcome: Progressing   Problem: RH BOWEL ELIMINATION Goal: RH STG MANAGE BOWEL WITH ASSISTANCE Description: STG Manage Bowel with supervision Assistance. Outcome: Progressing   Problem: RH BLADDER ELIMINATION Goal: RH STG MANAGE BLADDER WITH ASSISTANCE Description: STG Manage Bladder With supervision Assistance Outcome: Progressing   Problem: RH SKIN INTEGRITY Goal: RH STG MAINTAIN SKIN INTEGRITY WITH ASSISTANCE Description: STG Maintain Skin Integrity With supervision Assistance. Outcome: Progressing   Problem: RH SAFETY Goal: RH STG ADHERE TO SAFETY PRECAUTIONS W/ASSISTANCE/DEVICE Description: STG Adhere to Safety Precautions With supervision Assistance/Device. Outcome: Progressing   Problem: RH PAIN MANAGEMENT Goal: RH STG PAIN MANAGED AT OR BELOW PT'S PAIN GOAL Description: <3 on a 0-10 pain scale Outcome: Progressing   Problem: RH KNOWLEDGE DEFICIT Goal: RH STG INCREASE KNOWLEDGE OF STROKE PROPHYLAXIS Description: Patient will be able to demonstrate knowledge on medication management, medications used for future stroke prevention, and follow up care with the MD post discharge with min assist from CIR staff. Outcome: Progressing   

## 2020-05-21 NOTE — Progress Notes (Signed)
Patient ID: Tyler Glass, male   DOB: [birth year], 86 y.o.   MRN: [id number]  Daughter would like for her Mom to come in tomorrow to attend family education with her due to she will also be helping pt at home. Have called front desk to have her approved to come in tomorrow-[facility] [place]. Have gotten daughter's address where pt is going at discharge to work on home health arrangements. [address] [place] [address]. See tomorrow to answer any questions.

## 2020-05-21 NOTE — Progress Notes (Signed)
Speech Language Pathology Daily Session Note  Patient Details  Name: Tyler Glass MRN: [id number] Date of Birth: 11/07/1933  Today's Date: 05/21/2020 SLP Individual Time: 1300-1400 SLP Individual Time Calculation (min): 60 min  Short Term Goals: Week 3: SLP Short Term Goal 1 (Week 3): STG=LTG due to short ELOS  Skilled Therapeutic Interventions:   Patient seen for skilled ST session focusing on cognitive function goals. He ambulated in wheelchair majority of way to therapy room and said "Lavenia Atlas been doing this lately". He recalled computer program he worked on with PT yesterday when SLP showed him a Building services engineer of the computer device (BITS). When he and SLP later went to interact with the BITS, he started to recall activities he had worked on previous date. Patient was able to improve with problem solving during maze task with mod-maxA fading to modA level cues. He performed delayed recall of order of presentation of 4 object pictures and was initially 83% accurate, then improving to 88% next trial. Patient performed hands on task of creating design as shown in picture using pvc pipes of different kinds and lengths. He initially required maxA cues to perform and did seem a little overwhelmed, but he then progressed to min-modA cues at end of task. Patient continues to benefit from skilled SLP intervention to maximize cognitive-linguistic and swallow function prior to discharge.  Pain Pain Assessment Pain Scale: 0-10 Pain Score: 0-No pain  Therapy/Group: Individual Therapy   Angela Nevin, MA, CCC-SLP Speech Therapy

## 2020-05-21 NOTE — Progress Notes (Signed)
Occupational Therapy Session Note  Patient Details  Name: Corrin Hingle MRN: [id number] Date of Birth: February 29, 1934  Today's Date: 05/21/2020 OT Individual Time: 1100-1200 OT Individual Time Calculation (min): 60 min    Short Term Goals: Week 3:  OT Short Term Goal 1 (Week 3): STGs=LTGs due to ELOS  Skilled Therapeutic Interventions/Progress Updates:    Pt sitting up in w/c, no c/o pain, agreeable to OT session.  Pt self propelled to ortho gym with distant supervision.  Pt completed nustep BUE/BLE resistance level 5 x 10 minutes in preperation for motor coordination and balance activity.  Pt completed blocked practice sit<>stand with turning component using RW requiring VCs and TCs to increase safety with RW mgt and approaching sitting surface due to sitting prematurely requiring min assist during first attempt to recover right sided push and CGA during following attempts with improved carryover.  Pt also required visual demonstration of safe technique to facilitate increased carryover.  Pt completed dynamic standing task to toss horseshoes at target without DME requiring CGA.  Pt self propelled back to room, call bell in reach, seat belt alarm on.  Therapy Documentation Precautions:  Precautions Precautions: Fall, Other (comment) Precaution Comments: R hemiparesis with strong R lean Restrictions Weight Bearing Restrictions: No   Therapy/Group: Individual Therapy  Amie Critchley 05/21/2020, 3:53 PM

## 2020-05-22 ENCOUNTER — Inpatient Hospital Stay (HOSPITAL_COMMUNITY): Payer: Medicare HMO

## 2020-05-22 ENCOUNTER — Encounter (HOSPITAL_COMMUNITY): Payer: Medicare HMO | Admitting: Occupational Therapy

## 2020-05-22 ENCOUNTER — Encounter (HOSPITAL_COMMUNITY): Payer: Medicare HMO | Admitting: Speech Pathology

## 2020-05-22 ENCOUNTER — Ambulatory Visit (HOSPITAL_COMMUNITY): Payer: Medicare HMO

## 2020-05-22 ENCOUNTER — Inpatient Hospital Stay (HOSPITAL_COMMUNITY): Payer: Medicare HMO | Admitting: Occupational Therapy

## 2020-05-22 LAB — CBC WITH DIFFERENTIAL/PLATELET
Abs Immature Granulocytes: 0.01 10*3/uL (ref 0.00–0.07)
Basophils Absolute: 0 10*3/uL (ref 0.0–0.1)
Basophils Relative: 1 %
Eosinophils Absolute: 0.4 10*3/uL (ref 0.0–0.5)
Eosinophils Relative: 7 %
HCT: 33.1 % — ABNORMAL LOW (ref 39.0–52.0)
Hemoglobin: 10.9 g/dL — ABNORMAL LOW (ref 13.0–17.0)
Immature Granulocytes: 0 %
Lymphocytes Relative: 34 %
Lymphs Abs: 2 10*3/uL (ref 0.7–4.0)
MCH: 29.5 pg (ref 26.0–34.0)
MCHC: 32.9 g/dL (ref 30.0–36.0)
MCV: 89.5 fL (ref 80.0–100.0)
Monocytes Absolute: 0.6 10*3/uL (ref 0.1–1.0)
Monocytes Relative: 11 %
Neutro Abs: 2.7 10*3/uL (ref 1.7–7.7)
Neutrophils Relative %: 47 %
Platelets: 341 10*3/uL (ref 150–400)
RBC: 3.7 MIL/uL — ABNORMAL LOW (ref 4.22–5.81)
RDW: 13.3 % (ref 11.5–15.5)
WBC: 5.7 10*3/uL (ref 4.0–10.5)
nRBC: 0 % (ref 0.0–0.2)

## 2020-05-22 NOTE — Progress Notes (Signed)
[facility] PHYSICAL MEDICINE & REHABILITATION PROGRESS NOTE   Subjective/Complaints: [name] has no complaints this morning. Left thigh pain has improved Hiccups gone Upgraded to regular diet but he continues to avoid hard foods.   ROS: Denies CP, SOB, N/V/D  Objective:   No results found. Recent Labs    05/22/20 0455  WBC 5.7  HGB 10.9*  HCT 33.1*  PLT 341   No results for input(s): NA, K, CL, CO2, GLUCOSE, BUN, CREATININE, CALCIUM in the last 72 hours.  Intake/Output Summary (Last 24 hours) at 05/22/2020 1200 Last data filed at 05/22/2020 0800 Gross per 24 hour  Intake 780 ml  Output 1795 ml  Net -1015 ml   Physical Exam: Vital Signs Blood pressure 109/70, pulse 79, temperature 98.7 F (37.1 C), resp. rate 16, height 6\' 1"  (1.854 m), weight 76.3 kg, SpO2 97 %.  Gen: no distress, normal appearing HEENT: oral mucosa pink and moist, NCAT Cardio: Reg rate Chest: normal effort, normal rate of breathing Abd: soft, non-distended Ext: no edema Skin: intact Psych: Normal mood.  Normal behavior. Musc: No edema in extremities.  No tenderness in extremities. Neuro: Alert Moderate dysarthria, stable Motor: 5/5 throughout, stable Mild facial weakness   Assessment/Plan: 1. Functional deficits which require 3+ hours per day of interdisciplinary therapy in a comprehensive inpatient rehab setting.  Physiatrist is providing close team supervision and 24 hour management of active medical problems listed below.  Physiatrist and rehab team continue to assess barriers to discharge/monitor patient progress toward functional and medical goals  Care Tool:  Bathing    Body parts bathed by patient: Right arm, Left arm, Chest, Abdomen, Front perineal area, Right upper leg, Left upper leg, Face, Buttocks, Left lower leg, Right lower leg   Body parts bathed by helper: Buttocks Body parts n/a: Left lower leg, Right lower leg   Bathing assist Assist Level: Supervision/Verbal  cueing     Upper Body Dressing/Undressing Upper body dressing   What is the patient wearing?: Pull over shirt    Upper body assist Assist Level: Set up assist    Lower Body Dressing/Undressing Lower body dressing      What is the patient wearing?: Pants, Underwear/pull up     Lower body assist Assist for lower body dressing: Supervision/Verbal cueing     Toileting Toileting    Toileting assist Assist for toileting: Contact Guard/Touching assist Assistive Device Comment: urinal   Transfers Chair/bed transfer  Transfers assist     Chair/bed transfer assist level: Contact Guard/Touching assist     Locomotion Ambulation   Ambulation assist      Assist level: Minimal Assistance - Patient > 75% Assistive device: Walker-rolling Max distance: 200   Walk 10 feet activity   Assist  Walk 10 feet activity did not occur: N/A  Assist level: Minimal Assistance - Patient > 75% Assistive device: Walker-rolling   Walk 50 feet activity   Assist Walk 50 feet with 2 turns activity did not occur: Safety/medical concerns  Assist level: Minimal Assistance - Patient > 75% Assistive device: Walker-rolling    Walk 150 feet activity   Assist Walk 150 feet activity did not occur: N/A  Assist level: Minimal Assistance - Patient > 75% Assistive device: Walker-rolling    Walk 10 feet on uneven surface  activity   Assist Walk 10 feet on uneven surfaces activity did not occur: N/A         Wheelchair     Assist Will patient use wheelchair at discharge?:  (  TBD)             Wheelchair 50 feet with 2 turns activity    Assist        Assist Level: Total Assistance - Patient < 25%   Wheelchair 150 feet activity     Assist          Medical Problem List and Plan: 1.  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   Antithrombotics: -DVT/anticoagulation:   Lovenox changed to Eliquis  Doppler showing posterior tib/peroneal and gastroc DVTs left lower extremity.             -antiplatelet therapy: Aspirin 81 mg daily, Plavix 75 mg daily DC'd due to initiation of Eliquis, transition to ASA after 3 to 6 months per neurology 3. Pain Management: Tylenol as needed  Robaxin as needed  Voltaren gel ordered for right knee along with brace  Controlled 12/3 4. Mood: Provide emotional support             -antipsychotic agents: N/A 5. Neuropsych: This patient is not fully capable of making decisions on his own behalf. 6. Skin/Wound Care: Routine skin checks 7. Fluids/Electrolytes/Nutrition: Routine in and outs 8. Post stroke dysphagia.    Advanced to regular thins with intermittent supervision             Continue to advance diet as tolerated 9. Essential hypertension.  Patient on Norvasc 2.5 mg daily and Zestoretic 10-12.5 mg daily prior to admission.  Resume as needed   Vitals:   05/21/20 1929 05/22/20 0457  BP: 116/74 109/70  Pulse: 75 79  Resp: 16   Temp: 98.6 F (37 C) 98.7 F (37.1 C)  SpO2: 99% 97%   Controlled on 12/3 off medications.  10.  Hyperlipidemia: Lipitor 11. Hiccups- Resolved  Continue baclofen 10 mg TID, decreased to 5 3 times daily on 11/17, d/ced on 11/18  Discussed with pharmacy, Thorazine DC'd on 11/30 12. Constipation  Bowel meds increased on 11/23, increased again 11/24  Improved 13.  Hypokalemia  Potassium 3.7 on 11/29 14.  Transaminitis  ALT remains elevated, but stable on 11/29 15.  Hypoalbuminemia  Supplement initiated on 11/15 16.?  CKD stage IIIa  GFR 52 on 11/29  Encourage fluids  Continue to monitor 17.  Acute blood loss anemia  Hemoglobin 10.3 on 11/26, 10.9 on 12/3  Continue to monitor 18.  Left iliopsoas strain  MRI suggesting muscle strain versus partial tear with tendinopathy  See #3 19.  Right knee pain  Likely OA  See #3  Requested patient to review  with daughter type of brace that patient has that has been effective for him  Improved  LOS: 21 days A FACE TO FACE EVALUATION WAS PERFORMED  Desare Duddy P Tabbitha Janvrin 05/22/2020, 12:00 PM

## 2020-05-22 NOTE — Progress Notes (Signed)
Patient ID: Tyler Glass, male   DOB: [birth year], 86 y.o.   MRN: [id number]  Met with pt, wife and daughter who are here for education in preparation for discharge Monday. All feel it went well and feel comfortable with his care and discharge for Monday. Daughter to call [employer] to pay co-pays so equipment can be delivered to room prior to discharge home. See on Monday for any last minute questions.

## 2020-05-22 NOTE — Progress Notes (Signed)
Occupational Therapy Discharge Summary  Patient Details  Name: Tyler Glass MRN: [id number] Date of Birth: January 16, 1934  Today's Date: 05/22/2020 OT Individual Time:  OT Individual Time Calculation (min):     Patient has met 10 of 10 long term goals due to improved activity tolerance, improved balance, postural control, ability to compensate for deficits, improved awareness and improved coordination.  Patient to discharge at overall Supervision level.  Patient's care partner is independent to provide the necessary physical and cognitive assistance at discharge.  Family education has been completed with the pt's wife and daughter.   Recommendation:  Patient will benefit from ongoing skilled OT services in home health setting to continue to advance functional skills in the area of BADL and iADL.  Equipment: BSC and RW  Reasons for discharge: treatment goals met and discharge from hospital  Patient/family agrees with progress made and goals achieved: Yes  OT Discharge Precautions/Restrictions  Precautions Precautions: Fall;Other (comment) Precaution Comments: Pushes mildly-moderately to right Restrictions Weight Bearing Restrictions: No Pain Pain Assessment Pain Scale: 0-10 Pain Score: 0-No pain ADL ADL Grooming: Supervision/safety Where Assessed-Grooming: Standing at sink Upper Body Bathing: Supervision/safety Where Assessed-Upper Body Bathing: Sitting at sink Lower Body Bathing: Supervision/safety Where Assessed-Lower Body Bathing: Sitting at sink, Standing at sink Upper Body Dressing: Setup Where Assessed-Upper Body Dressing: Sitting at sink Lower Body Dressing: Supervision/safety Where Assessed-Lower Body Dressing: Sitting at sink, Standing at sink Toileting: Contact guard Where Assessed-Toileting: Bedside Commode (over toilet) Toilet Transfer: Therapist, music Method: Counselling psychologist: Bedside commode Vision Baseline Vision/History:  No visual deficits Patient Visual Report: No change from baseline Vision Assessment?: No apparent visual deficits Perception  Perception: Impaired Spatial Orientation: mild midline orientation impairment Praxis Praxis: Impaired Praxis Impairment Details: Motor planning Cognition Overall Cognitive Status: Impaired/Different from baseline Arousal/Alertness: Awake/alert Orientation Level: Oriented X4 Attention: Sustained;Divided;Alternating Focused Attention: Appears intact Sustained Attention: Appears intact Alternating Attention: Appears intact Divided Attention: Impaired Divided Attention Impairment: Functional basic Memory: Impaired Memory Impairment: Decreased recall of new information;Storage deficit;Retrieval deficit Decreased Short Term Memory: Functional basic Awareness: Impaired Awareness Impairment: Emergent impairment (however improved from initial evaluation) Problem Solving Impairment: Functional basic Executive Function: Sequencing;Initiating;Self Correcting Sequencing: Impaired Sequencing Impairment: Functional basic Initiating: Impaired Initiating Impairment: Functional basic Self Correcting: Impaired Self Correcting Impairment: Functional basic Safety/Judgment: Impaired Sensation Sensation Light Touch: Appears Intact Hot/Cold: Appears Intact Proprioception: Impaired Detail Proprioception Impaired Details: Impaired RLE Stereognosis: Not tested Coordination Gross Motor Movements are Fluid and Coordinated: No Fine Motor Movements are Fluid and Coordinated: Yes Coordination and Movement Description: impaired coordination of B LEs during stand pivot transfers however greatly improved from initial evaluation Finger Nose Finger Test: [facility] BUE Motor  Motor Motor: Abnormal postural alignment and control Motor - Skilled Clinical Observations: mild to moderate right pushing with dynamic standing Mobility  Bed Mobility Bed Mobility: Supine to Sit;Sit to  Supine Supine to Sit: Supervision/Verbal cueing Sit to Supine: Supervision/Verbal cueing Transfers Sit to Stand: Supervision/Verbal cueing Stand to Sit: Supervision/Verbal cueing  Trunk/Postural Assessment  Cervical Assessment Cervical Assessment: Exceptions to [facility] (forward head) Thoracic Assessment Thoracic Assessment: Exceptions to [facility] (mild kyphosis) Lumbar Assessment Lumbar Assessment: Exceptions to [facility] (posterior pelvic tilt) Postural Control Postural Control: Deficits on evaluation Trunk Control: mild right lateral pushing  Balance Balance Balance Assessed: Yes Static Sitting Balance Static Sitting - Balance Support: No upper extremity supported Static Sitting - Level of Assistance: 7: Independent Dynamic Sitting Balance Dynamic Sitting - Balance Support: No upper extremity supported Dynamic Sitting - Level  of Assistance: 5: Stand by assistance Static Standing Balance Static Standing - Balance Support: No upper extremity supported Static Standing - Level of Assistance: 5: Stand by assistance Dynamic Standing Balance Dynamic Standing - Balance Support: Bilateral upper extremity supported Dynamic Standing - Level of Assistance: 5: Stand by assistance Extremity/Trunk Assessment RUE Assessment RUE Assessment: Within Functional Limits LUE Assessment LUE Assessment: Within Functional Limits   Tyler Glass 05/22/2020, 4:17 PM

## 2020-05-22 NOTE — Plan of Care (Signed)
  Problem: Consults Goal: RH STROKE PATIENT EDUCATION Description: See Patient Education module for education specifics  Outcome: Progressing   Problem: RH BOWEL ELIMINATION Goal: RH STG MANAGE BOWEL WITH ASSISTANCE Description: STG Manage Bowel with supervision Assistance. Outcome: Progressing   Problem: RH BLADDER ELIMINATION Goal: RH STG MANAGE BLADDER WITH ASSISTANCE Description: STG Manage Bladder With supervision Assistance Outcome: Progressing   Problem: RH SKIN INTEGRITY Goal: RH STG MAINTAIN SKIN INTEGRITY WITH ASSISTANCE Description: STG Maintain Skin Integrity With supervision Assistance. Outcome: Progressing   Problem: RH SAFETY Goal: RH STG ADHERE TO SAFETY PRECAUTIONS W/ASSISTANCE/DEVICE Description: STG Adhere to Safety Precautions With supervision Assistance/Device. Outcome: Progressing   Problem: RH PAIN MANAGEMENT Goal: RH STG PAIN MANAGED AT OR BELOW PT'S PAIN GOAL Description: <3 on a 0-10 pain scale Outcome: Progressing   Problem: RH KNOWLEDGE DEFICIT Goal: RH STG INCREASE KNOWLEDGE OF STROKE PROPHYLAXIS Description: Patient will be able to demonstrate knowledge on medication management, medications used for future stroke prevention, and follow up care with the MD post discharge with min assist from CIR staff. Outcome: Progressing   

## 2020-05-22 NOTE — Progress Notes (Signed)
Speech Language Pathology Daily Session Note   Patient Details  Name: Tyler Glass MRN: [id number] Date of Birth: Apr 23, 1934  Today's Date: 05/22/2020 SLP Individual Time: 1135-1205 SLP Individual Time Calculation (min): 30 min   Skilled Therapeutic Interventions:  Patient seen with wife and daughter present for family education. SLP focused on importance of them providing supervision and assistance with medication management as patient was not consistently doing this prior to hospitalization. In addition, SLP recommended that family not provide assistance with things patient is able to perform independently, but to provide 24 hour supervision. SLP informed family of observations that patient appears hesitant initially with new tasks and with changes in routine. All questions answered to satisfaction of patient, wife and daughter.      Angela Nevin, MA, CCC-SLP Speech Therapy

## 2020-05-22 NOTE — Progress Notes (Signed)
Physical Therapy Session Note  Patient Details  Name: Tyler Glass MRN: [id number] Date of Birth: 1933-11-04  Today's Date: 05/22/2020 PT Individual Time: 1045-1130 + 1345-1430 PT Individual Time Calculation (min): 45 min + 45 min  Short Term Goals: Week 3:  PT Short Term Goal 1 (Week 3): STG = LTG due to ELOS  Skilled Therapeutic Interventions/Progress Updates:      1st session: Pt greeted seated in w/c with wife and daughter at bedside, pt agreeable to PT session with no reports of pain. Focus of session to perform scheduled family training/education. Lengthy discussion held with family regarding home safety, DME rec's, pt's progress with mobility, and also his current limitations to being modified independent with functional mobility. Discussed the importance of strict supervision/assist for all functional mobility tasks including transfers, ambulation, and stairs. All questions and concerns addressed with family and the patient. Pt transported in w/c for time management from his room to main therapy gym. With family present, he ambulated 273ft with CGA/minA and RW on level surfaces. Also performed gait with no AD to allow family to witness R pushing and level of assist required, which was modA for ~13ft. He also performed stair training, going up/down x4 steps with min/modA and L handrail support. Max cues required for stepping technique (ascend with L foot and descent with R foot leading). Family present and educated on technique and safety strategies, they deferred hands-on training as they felt comfortable. He then performed car transfer with CGA and RW with car height simulated to their mid-size SUV, again family present for education. He ambulated back to his room with CGA/minA and RW and ended session seated in w/c with needs in reach and chair alarm on, family remaining at bedside.    2nd session: Pt received supine in bed, agreeable to PT session, unaware that he had more therapies  scheduled for today. He denies pain. Performed supine<>sit with supervision and he donned tennis shoes with setupA while seated EOB. Squat<>pivot transfer with close supervision from EOB to w/c. He propelled himself >21ft in hallways, using BUE's, from his room to day room. Squat<>pivot transfer with CGA from w/c to Nustep. Completed x76min at workload of 5, focusing on bilateral LE coordination and RLE knee/hip extension. He required brief seated rest break at the 8 minute mark due to fatigue, rates RPE at 7/10. Squat<>pivot transfer with CGA from Nustep back to his w/c. Performed TUG, x3 trials, with minA and RW, increased unsteadiness with turns and with safety approaching to w/c. Provided cues for using RUE for reaching back to chair prior to sitting to assist with midline as he continues to shift R while attempting to sit. Performed dyanmic standing balance with bean bag toss, focusing on RUE coordination and forward weight shifting. He then was instructed to ambulate without an AD and retrieve the bags, requiring modA for gait and heavy modA for picking up bags from floor. He was returned to his room where he remained seated in w/c with needs in reach and chair alarm on.  Therapy Documentation Precautions:  Precautions Precautions: Fall, Other (comment) Precaution Comments: R hemiparesis with strong R lean Restrictions Weight Bearing Restrictions: No  Therapy/Group: Individual Therapy  Tyler Glass Tyler Glass PT 05/22/2020, 11:32 AM

## 2020-05-22 NOTE — Progress Notes (Signed)
Occupational Therapy Session Note  Patient Details  Name: Tyler Glass MRN: [id number] Date of Birth: 1933/09/04  Today's Date: 05/22/2020 OT Individual Time: 1000-1045; 6222-9798 OT Individual Time Calculation (min): 45 min ; and 30 min   Short Term Goals: Week 3:  OT Short Term Goal 1 (Week 3): STGs=LTGs due to ELOS  Skilled Therapeutic Interventions/Progress Updates:    First session:  Pt sitting up in w/c with wife and daughter present for family education.  OT educated family regarding pts current functional status including self care and functional mobility.  Demonstration provided with pt and facilitation of hands on training with wife to complete sinkside bathing and dressing and toilet transfers.  Pt ambulated w/c to sink using RW with CGA. Pt brushed teeth, washed face, and doffed shirt in standing with supervision.  Stand to sit with CGA at armchair in front of sink to doff pants, brief, socks and shoes with supervision and bathed UB/LB in sitting/standing with CGA.  Pt ambulated to bathroom and completed toilet transfer with CGA provided by wife with OT supervision.  Pt ambulated to w/c using RW and stand to sit with CGA.  Educated pts wife and dtr regarding specific VCs to provide to pt as needed for safety awareness and safe body Production manager.  Pts wife reported and demonstrated good understanding of all training.  Pts daughter present throughout session, agreeable to observe and reports understanding. Call bell in reach at end of session.  Second session:  Pt sitting up in w/c, no c/o pain, agreeable to OT session.  Pt participated in blocked training of functional transfers sit<>stand using RW with turning component.  Re-educated pt regarding safety techniques including ensuring BLE touch surface to sit on prior to descent and keeping RW in front of self until seated.  Pt completed ambulation w/c<>3 in 1 commode over toilet, w/c<>armchair at sink, and EOB<>3 in 1 commode  over toilet requiring CGA throughout.  Improved hand placement noted and good carryover of techniques educated on at beginning of session with less VCs needed.  Pt completed sit to supine with supervision.  Call bell in reach, bed alarm on.  Therapy Documentation Precautions:  Precautions Precautions: Fall, Other (comment) Precaution Comments: R hemiparesis with strong R lean Restrictions Weight Bearing Restrictions: No   Therapy/Group: Individual Therapy  Amie Critchley 05/22/2020, 12:37 PM

## 2020-05-23 DIAGNOSIS — S76912S Strain of unspecified muscles, fascia and tendons at thigh level, left thigh, sequela: Secondary | ICD-10-CM

## 2020-05-23 NOTE — Plan of Care (Signed)
  Problem: Consults Goal: RH STROKE PATIENT EDUCATION Description: See Patient Education module for education specifics  Outcome: Progressing   Problem: RH BOWEL ELIMINATION Goal: RH STG MANAGE BOWEL WITH ASSISTANCE Description: STG Manage Bowel with supervision Assistance. Outcome: Progressing   Problem: RH BLADDER ELIMINATION Goal: RH STG MANAGE BLADDER WITH ASSISTANCE Description: STG Manage Bladder With supervision Assistance Outcome: Progressing   Problem: RH SKIN INTEGRITY Goal: RH STG MAINTAIN SKIN INTEGRITY WITH ASSISTANCE Description: STG Maintain Skin Integrity With supervision Assistance. Outcome: Progressing   Problem: RH SAFETY Goal: RH STG ADHERE TO SAFETY PRECAUTIONS W/ASSISTANCE/DEVICE Description: STG Adhere to Safety Precautions With supervision Assistance/Device. Outcome: Progressing   Problem: RH PAIN MANAGEMENT Goal: RH STG PAIN MANAGED AT OR BELOW PT'S PAIN GOAL Description: <3 on a 0-10 pain scale Outcome: Progressing   Problem: RH KNOWLEDGE DEFICIT Goal: RH STG INCREASE KNOWLEDGE OF STROKE PROPHYLAXIS Description: Patient will be able to demonstrate knowledge on medication management, medications used for future stroke prevention, and follow up care with the MD post discharge with min assist from CIR staff. Outcome: Progressing   

## 2020-05-23 NOTE — Progress Notes (Signed)
[place] PHYSICAL MEDICINE & REHABILITATION PROGRESS NOTE   Subjective/Complaints: No issues this morning. Appreciative of the efforts of the rehab team. Anxious to get home!  ROS: Patient denies fever, rash, sore throat, blurred vision, nausea, vomiting, diarrhea, cough, shortness of breath or chest pain, joint or back pain, headache, or mood change.    Objective:   No results found. Recent Labs    05/22/20 0455  WBC 5.7  HGB 10.9*  HCT 33.1*  PLT 341   No results for input(s): NA, K, CL, CO2, GLUCOSE, BUN, CREATININE, CALCIUM in the last 72 hours.  Intake/Output Summary (Last 24 hours) at 05/23/2020 7544 Last data filed at 05/23/2020 0745 Gross per 24 hour  Intake 780 ml  Output 625 ml  Net 155 ml   Physical Exam: Vital Signs Blood pressure 100/61, pulse 79, temperature 98.5 F (36.9 C), resp. rate 16, height 6\' 1"  (1.854 m), weight 76.3 kg, SpO2 95 %.  Constitutional: No distress . Vital signs reviewed. HEENT: EOMI, oral membranes moist Neck: supple Cardiovascular: RRR without murmur. No JVD    Respiratory/Chest: CTA Bilaterally without wheezes or rales. Normal effort    GI/Abdomen: BS +, non-tender, non-distended Ext: no clubbing, cyanosis, or edema Psych: pleasant and cooperative Skin: intact. Musc: No edema in extremities.  No tenderness in extremities. Neuro: Alert, fair insight and awareness Moderate dysarthria, stable Motor: 5/5 throughout, stable.  Mild facial weakness   Assessment/Plan: 1. Functional deficits which require 3+ hours per day of interdisciplinary therapy in a comprehensive inpatient rehab setting.  Physiatrist is providing close team supervision and 24 hour management of active medical problems listed below.  Physiatrist and rehab team continue to assess barriers to discharge/monitor patient progress toward functional and medical goals  Care Tool:  Bathing    Body parts bathed by patient: Right arm, Left arm, Chest, Abdomen, Front  perineal area, Right upper leg, Left upper leg, Face, Buttocks, Left lower leg, Right lower leg   Body parts bathed by helper: Buttocks Body parts n/a: Left lower leg, Right lower leg   Bathing assist Assist Level: Supervision/Verbal cueing     Upper Body Dressing/Undressing Upper body dressing   What is the patient wearing?: Pull over shirt    Upper body assist Assist Level: Set up assist    Lower Body Dressing/Undressing Lower body dressing      What is the patient wearing?: Pants, Underwear/pull up     Lower body assist Assist for lower body dressing: Supervision/Verbal cueing     Toileting Toileting    Toileting assist Assist for toileting: Contact Guard/Touching assist Assistive Device Comment: urinal   Transfers Chair/bed transfer  Transfers assist     Chair/bed transfer assist level: Contact Guard/Touching assist     Locomotion Ambulation   Ambulation assist      Assist level: Minimal Assistance - Patient > 75% Assistive device: Walker-rolling Max distance: 200   Walk 10 feet activity   Assist  Walk 10 feet activity did not occur: N/A  Assist level: Minimal Assistance - Patient > 75% Assistive device: Walker-rolling   Walk 50 feet activity   Assist Walk 50 feet with 2 turns activity did not occur: Safety/medical concerns  Assist level: Minimal Assistance - Patient > 75% Assistive device: Walker-rolling    Walk 150 feet activity   Assist Walk 150 feet activity did not occur: N/A  Assist level: Minimal Assistance - Patient > 75% Assistive device: Walker-rolling    Walk 10 feet on uneven surface  activity   Assist Walk 10 feet on uneven surfaces activity did not occur: N/A         Wheelchair     Assist Will patient use wheelchair at discharge?:  (TBD)             Wheelchair 50 feet with 2 turns activity    Assist        Assist Level: Total Assistance - Patient < 25%   Wheelchair 150 feet activity      Assist          Medical Problem List and Plan: 1.  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  Antithrombotics: -DVT/anticoagulation:   Lovenox changed to Eliquis  Doppler showing posterior tib/peroneal and gastroc DVTs left lower extremity.             -antiplatelet therapy: Aspirin 81 mg daily, Plavix 75 mg daily DC'd due to initiation of Eliquis, transition to ASA after 3 to 6 months per neurology 3. Pain Management: Tylenol as needed  Robaxin as needed  Voltaren gel ordered for right knee along with brace  Controlled 12/4 4. Mood: Provide emotional support             -antipsychotic agents: N/A 5. Neuropsych: This patient is not fully capable of making decisions on his own behalf. 6. Skin/Wound Care: Routine skin checks 7. Fluids/Electrolytes/Nutrition: Routine in and outs 8. Post stroke dysphagia.    Advanced to regular thins with intermittent supervision             Continue to advance diet as tolerated 9. Essential hypertension.  Patient on Norvasc 2.5 mg daily and Zestoretic 10-12.5 mg daily prior to admission.  Resume as needed   Vitals:   05/22/20 1934 05/23/20 0539  BP: 111/71 100/61  Pulse: 82 79  Resp: 17 16  Temp: 98.4 F (36.9 C) 98.5 F (36.9 C)  SpO2: 99% 95%   Controlled on 12/4 off medications.  10.  Hyperlipidemia: Lipitor 11. Hiccups- Resolved  Continue baclofen 10 mg TID, decreased to 5 3 times daily on 11/17, d/ced on 11/18  Discussed with pharmacy, Thorazine DC'd on 11/30 12. Constipation  Bowel meds increased on 11/23, increased again 11/24  Improved 13.  Hypokalemia  Potassium 3.7 on 11/29 14.  Transaminitis  ALT remains elevated, but stable on 11/29 15.  Hypoalbuminemia  Supplement initiated on 11/15 16.?  CKD stage IIIa  GFR 52 on 11/29  Encourage fluids  Continue to monitor 17.  Acute blood loss anemia  Hemoglobin 10.3 on 11/26,  10.9 on 12/3  Continue to monitor 18.  Left iliopsoas strain  MRI suggesting muscle strain versus partial tear with tendinopathy  See #3 19.  Right knee pain  Likely OA  See #3  Right knee sleeve/brace for support   LOS: 22 days A FACE TO FACE EVALUATION WAS PERFORMED  Ranelle Oyster 05/23/2020, 8:22 AM

## 2020-05-23 NOTE — Discharge Summary (Signed)
Physical Therapy Discharge Summary  Patient Details  Name: Tyler Glass MRN: [id number] Date of Birth: Dec 15, 1933  Today's Date: 05/24/2020 PT Individual Time: 1355-1440 PT Individual Time Calculation (min): 45 min   Patient has met 8 of 10 long term goals due to improved activity tolerance, improved balance, improved postural control, increased strength, ability to compensate for deficits, improved attention, improved awareness and improved coordination.  Patient to discharge at an ambulatory level [place].   Patient's care partner is independent to provide the necessary physical and cognitive assistance at discharge.  Reasons goals not met: Ambulation goal set for CGA however pt fluctuated with performances and more consistently required minA for safety due to his R lateral lean/push. Family training was performed with both daughter and wife prior to DC and they demonstrated good understanding of precautions, guarding techniques, and safe mobility.   Recommendation:  Patient will benefit from ongoing skilled PT services in home health setting to continue to advance safe functional mobility, address ongoing impairments in standing balance, gait deficits, RLE weakness, and coordination training in order to minimize fall risk.  Equipment: RW  Reasons for discharge: treatment goals met and discharge from hospital  Patient/family agrees with progress made and goals achieved: Yes  Skilled Therapeutic Interventions/Progress Updates:  Patient in w/c in the room upon PT arrival. Patient alert and agreeable to PT session. Patient denied pain during session.  Therapeutic Activity: Bed Mobility: Patient performed supine to/from sit with mod I for increased time on a flat bed without use of bed rails.  Transfers: Patient performed sit to/from stand from w/c, hospital bed, standard arm chair, and mat table with supervision using RW throughout session. Provided verbal cues for pushing up from  sitting surface x1 for safety. Patient performed a simulated mid size SUV height car transfer with CGA using RW. Provided cues for safe technique. Patient performed floor transfer mat table<>floor mat to simulate fall recovery with CGA for safety. Educated on safe steps for fall recovery, performance of self-assessment before getting up from a fall, patient unable to recall any on the items to assess when performing floor transfer, crawling to a sturdy piece of furniture to get up to a sitting position, not standing, and performing a second self assessment in sitting. Encouraged patient to keep his cell phone in his pocket at all times when home alone for access to emergency services. Educated patient on fall risk/prevention, home modifications to prevent falls, and activation of emergency services in the event of a fall with injury or symptoms during session.    Gait Training:  Patient ambulated 150-200 feet x3 during session using RW with CGA progressing to min A intermittently after 50 feet. Ambulated as described below. Provided verbal cues for increased R foot clearance, recovery to midline with R lean, and intentionally R foot placement for increased motor control with stepping. Patient ambulated up/down a ramp, over 10 feet of mulch (unlevel surface), and up/down a curb to simulate community ambulation over unlevel surfaces with min A using RW. Provided cues for technique and use of AD. Patient ascended/descended 12 steps using B rails with CGA. Performed step-to gait pattern leading with L while ascending and R while descending. Provided cues for technique and sequencing.   Patient in w/c in the room at end of session with breaks locked, chair alarm set, and all needs within reach.   PT Discharge Precautions/Restrictions Precautions Precautions: Fall;Other (comment) Precaution Comments: R pusher, primarily in standing and during functional activities Required Braces or  Orthoses:  (Wears a R  neoprene knee sleeve for chronic R knee pain) Restrictions Weight Bearing Restrictions: No Vision/Perception  Perception Perception: Impaired Spatial Orientation: mild midline orientation impairment Praxis Praxis: Impaired Praxis Impairment Details: Motor planning  Cognition Overall Cognitive Status: Impaired/Different from baseline Arousal/Alertness: Awake/alert Orientation Level: Oriented X4 Attention: Selective;Sustained;Alternating Focused Attention: Appears intact Sustained Attention: Appears intact Selective Attention: Impaired Selective Attention Impairment: Functional basic;Functional complex Alternating Attention: Impaired Alternating Attention Impairment: Verbal complex;Functional complex Divided Attention: Impaired Divided Attention Impairment: Functional basic;Verbal complex Memory: Impaired Awareness: Impaired Problem Solving: Impaired Safety/Judgment: Impaired Sensation Sensation Light Touch: Appears Intact Hot/Cold: Appears Intact Proprioception: Impaired Detail Proprioception Impaired Details: Impaired RLE Stereognosis: Not tested Coordination Gross Motor Movements are Fluid and Coordinated: No Fine Motor Movements are Fluid and Coordinated: Yes Coordination and Movement Description: Impaired coordination of R LE during functional transfers and gait however this has improved greatly since date of initial evaluation Heel Shin Test: R LE decreased coordination Motor  Motor Motor: Abnormal postural alignment and control Motor - Skilled Clinical Observations: mild to moderate right pushing with dynamic standing  Mobility Bed Mobility Bed Mobility: Supine to Sit;Sit to Supine;Rolling Right;Rolling Left Rolling Right: Independent Rolling Left: Independent Supine to Sit: Independent Sit to Supine: Independent Transfers Transfers: Sit to Stand;Stand to Sit;Squat Pivot Transfers;Stand Pivot Transfers Sit to Stand: Supervision/Verbal cueing Stand to Sit:  Supervision/Verbal cueing Stand Pivot Transfers: Contact Guard/Touching assist Stand Pivot Transfer Details: Verbal cues for sequencing;Verbal cues for gait pattern;Verbal cues for technique;Verbal cues for safe use of DME/AE;Tactile cues for weight shifting;Verbal cues for precautions/safety;Tactile cues for placement;Tactile cues for sequencing;Visual cues for safe use of DME/AE Squat Pivot Transfers: Supervision/Verbal cueing Transfer (Assistive device): Rolling walker Locomotion  Gait Ambulation: Yes Gait Assistance: Contact Guard/Touching assist;Minimal Assistance - Patient > 75% Gait Distance (Feet): 200 Feet Assistive device: Rolling walker Gait Assistance Details: Tactile cues for weight shifting;Verbal cues for gait pattern;Verbal cues for sequencing;Verbal cues for technique;Visual cues/gestures for precautions/safety;Tactile cues for posture;Verbal cues for safe use of DME/AE;Verbal cues for precautions/safety;Visual cues/gestures for sequencing Gait Gait: Yes Gait Pattern: Impaired Gait Pattern: Step-through pattern;Decreased step length - right;Decreased stance time - right;Decreased hip/knee flexion - right;Narrow base of support;Poor foot clearance - right;Lateral trunk lean to right Stairs / Additional Locomotion Stairs: Yes Stairs Assistance: Contact Guard/Touching assist Stair Management Technique: Two rails Number of Stairs: 12 Height of Stairs: 6 Ramp: Minimal Assistance - Patient >75% (with RW) Curb: Minimal Assistance - Patient >75% (with RW) Wheelchair Mobility Wheelchair Mobility: Yes Wheelchair Assistance: Chartered loss adjuster: Both upper extremities Wheelchair Parts Management: Needs assistance Distance: >250 ft  Trunk/Postural Assessment  Cervical Assessment Cervical Assessment: Within Functional Limits Thoracic Assessment Thoracic Assessment: Exceptions to [facility] (rounded shulders) Lumbar Assessment Lumbar Assessment: Exceptions  to [facility] (posterior pelvic tilt) Postural Control Postural Control: Deficits on evaluation Trunk Control: mild right lateral pushing  Balance Balance Balance Assessed: Yes Static Sitting Balance Static Sitting - Balance Support: No upper extremity supported Static Sitting - Level of Assistance: 7: Independent Dynamic Sitting Balance Dynamic Sitting - Balance Support: No upper extremity supported Dynamic Sitting - Level of Assistance: 5: Stand by assistance Static Standing Balance Static Standing - Balance Support: No upper extremity supported Static Standing - Level of Assistance: 5: Stand by assistance Dynamic Standing Balance Dynamic Standing - Balance Support: During functional activity Dynamic Standing - Level of Assistance: Other (comment) (CGA) Extremity Assessment  RUE Assessment RUE Assessment: Within Functional Limits LUE Assessment LUE Assessment: Within Functional Limits RLE  Assessment RLE Assessment: Exceptions to [facility] General Strength Comments: Grossly 4+/5 LLE Assessment LLE Assessment: Within Functional Limits    Christian P Manhard PT, DPT Apolinar Junes PT, DPT  05/24/2020, 5:56 PM

## 2020-05-24 ENCOUNTER — Inpatient Hospital Stay (HOSPITAL_COMMUNITY): Payer: Medicare HMO

## 2020-05-24 ENCOUNTER — Inpatient Hospital Stay (HOSPITAL_COMMUNITY): Payer: Medicare HMO | Admitting: Speech Pathology

## 2020-05-24 NOTE — Plan of Care (Signed)
  Problem: Consults Goal: RH STROKE PATIENT EDUCATION Description: See Patient Education module for education specifics  Outcome: Progressing   Problem: RH BOWEL ELIMINATION Goal: RH STG MANAGE BOWEL WITH ASSISTANCE Description: STG Manage Bowel with supervision Assistance. Outcome: Progressing   Problem: RH BLADDER ELIMINATION Goal: RH STG MANAGE BLADDER WITH ASSISTANCE Description: STG Manage Bladder With supervision Assistance Outcome: Progressing   Problem: RH SKIN INTEGRITY Goal: RH STG MAINTAIN SKIN INTEGRITY WITH ASSISTANCE Description: STG Maintain Skin Integrity With supervision Assistance. Outcome: Progressing   Problem: RH SAFETY Goal: RH STG ADHERE TO SAFETY PRECAUTIONS W/ASSISTANCE/DEVICE Description: STG Adhere to Safety Precautions With supervision Assistance/Device. Outcome: Progressing   Problem: RH PAIN MANAGEMENT Goal: RH STG PAIN MANAGED AT OR BELOW PT'S PAIN GOAL Description: <3 on a 0-10 pain scale Outcome: Progressing   Problem: RH KNOWLEDGE DEFICIT Goal: RH STG INCREASE KNOWLEDGE OF STROKE PROPHYLAXIS Description: Patient will be able to demonstrate knowledge on medication management, medications used for future stroke prevention, and follow up care with the MD post discharge with min assist from CIR staff. Outcome: Progressing

## 2020-05-24 NOTE — Progress Notes (Signed)
Speech Language Pathology Discharge Summary  Patient Details  Name: Tyler Glass MRN: [id number] Date of Birth: 1933/08/01  Today's Date: 05/24/2020 SLP Individual Time: 1023-1100 SLP Individual Time Calculation (min): 37 min   Skilled Therapeutic Interventions:  Pt was seen for skilled ST targeting cognitive and dysphagia goals.  SLP facilitated the session with a functional snack of regular textures and thin liquids to assess toleration of currently prescribed diet prior to discharge.  Pt demonstrated no overt s/s of aspiration with solids or liquids.  Pt's mastication of solids was timely and efficient without leaving residue in the oral cavity post swallow.   Pt could recall general activities from previous ST therapy appointments with min assist question cues.  Pt was fully intelligible during conversations with SLP with no cues needed for use of any compensatory strategies, although he at times needed min cues for redirection to the topic at hand due to tangentiality.  Pt verbalized he is ready to discharge tomorrow.  Pt was left in wheelchair with chair alarm set and call bell within reach.      Patient has met 6 of 6 long term goals.  Patient to discharge at overall Supervision;Modified Independent;Min level.  Reasons goals not met: N/A   Clinical Impression/Discharge Summary: Patient made overall good progress and met all of his long term goals related to swallow function, speech/voice and cognitive function. At time of discharge, he is tolerating a regular texture solids, thin liquids diet with intermittent assistance/setup but modified independent overal. His voice is [facility] and speech intelligiblity is 90-100% at conversational level without cues. Cognitively, he benefits from repeated trials with new tasks, demonstration/modeling cues and visual cues for improved and consistent task performance. He demonstrates decreased recall of new information secondary to storage and retrieval deficit.  SLP has noted that patient appears to be apprehensive initially when trying new tasks, or when adapted device use is discontinued (ie: patient uneasy at first when oral suction discontinued even though he was able to expectorate saliva, etc. without it at time it was discontinued. He will benefit from 24 hour supervision from family for safety with ADL's and accuracy and consistent performance with medication management, etc.  Care Partner:  Caregiver Able to Provide Assistance: Yes  Type of Caregiver Assistance: Cognitive  Recommendation:  [place] SLP;24 hour supervision/assistance  Rationale for SLP Follow Up: Reduce caregiver burden;Maximize cognitive function and independence   Equipment: N/A   Reasons for discharge: Treatment goals met;Discharged from hospital   Patient/Family Agrees with Progress Made and Goals Achieved: Yes        Parthiv Mucci, Selinda Orion; Discharge summary portion written by pt's primary SLP Sonia Baller [place] 05/24/2020, 3:46 PM

## 2020-05-24 NOTE — Progress Notes (Signed)
Occupational Therapy Session Note  Patient Details  Name: Tyrees Chopin MRN: [id number] Date of Birth: [birth year]  Today's Date: 05/24/2020 OT Individual Time: 2548-3234 OT Individual Time Calculation (min): 39 min    Short Term Goals: Week 3:  OT Short Term Goal 1 (Week 3): STGs=LTGs due to ELOS  Skilled Therapeutic Interventions/Progress Updates:    Pt received supine with no c/o pain. Pt agreeable to take shower. Pt completed bed mobility with mod I, HOB raised. Pt used RW with min cueing and completed ambulatory transfer into the bathroom with CGA-min A, with slight R lean. Pt completed toileting tasks, voiding small BM, and then requiring CGA for hygiene. Pt completed transfer into shower onto TTB. Pt required cueing for safety, attempting to turn all the way around to wash his bottom in standing. CGA required during standing but all other bathing completed with supervision. Pt oriented to year, month, situation. Once at the sink pt applied shaving cream to his toothbrush despite OT stating he was using shaving cream, requiring intervention to prevent use. Pt then laughing stating "why didn't you tell me". Pt completed shaving with good demo of RUE proprioception. Pt donned shirt with set up assist, pants and underwear with CGA. Socks and shoes donned with set up assist. Pt was left sitting in the w/c with all needs met, chair pad alarm set.   Therapy Documentation Precautions:  Precautions Precautions: Fall, Other (comment) Precaution Comments: R pusher, primarily in standing and during functional activities Required Braces or Orthoses:  (Wears a R neoprene knee sleeve for chronic R knee pain) Restrictions Weight Bearing Restrictions: No   Therapy/Group: Individual Therapy  Curtis Sites 05/24/2020, 7:57 AM

## 2020-05-24 NOTE — Progress Notes (Signed)
[place] PHYSICAL MEDICINE & REHABILITATION PROGRESS NOTE   Subjective/Complaints: No complaints. Excited about going home tomorrow.   ROS: Patient denies fever, rash, sore throat, blurred vision, nausea, vomiting, diarrhea, cough, shortness of breath or chest pain, joint or back pain, headache, or mood change.    Objective:   No results found. Recent Labs    05/22/20 0455  WBC 5.7  HGB 10.9*  HCT 33.1*  PLT 341   No results for input(s): NA, K, CL, CO2, GLUCOSE, BUN, CREATININE, CALCIUM in the last 72 hours.  Intake/Output Summary (Last 24 hours) at 05/24/2020 0843 Last data filed at 05/24/2020 0522 Gross per 24 hour  Intake 600 ml  Output 1535 ml  Net -935 ml   Physical Exam: Vital Signs Blood pressure (!) 125/95, pulse 76, temperature 98.3 F (36.8 C), temperature source Oral, resp. rate 16, height 6\' 1"  (1.854 m), weight 76.3 kg, SpO2 98 %.  Constitutional: No distress . Vital signs reviewed. HEENT: EOMI, oral membranes moist Neck: supple Cardiovascular: RRR without murmur. No JVD    Respiratory/Chest: CTA Bilaterally without wheezes or rales. Normal effort    GI/Abdomen: BS +, non-tender, non-distended Ext: no clubbing, cyanosis, or edema Psych: pleasant and cooperative Skin: intact. Musc: No edema in extremities.  No tenderness in extremities. Neuro: Alert, fair insight and awareness Mild dysarthria Motor: 5/5 throughout, stable.  Mild facial weakness   Assessment/Plan: 1. Functional deficits which require 3+ hours per day of interdisciplinary therapy in a comprehensive inpatient rehab setting.  Physiatrist is providing close team supervision and 24 hour management of active medical problems listed below.  Physiatrist and rehab team continue to assess barriers to discharge/monitor patient progress toward functional and medical goals  Care Tool:  Bathing    Body parts bathed by patient: Right arm, Left arm, Chest, Abdomen, Front perineal area, Right  upper leg, Left upper leg, Face, Buttocks, Left lower leg, Right lower leg   Body parts bathed by helper: Buttocks Body parts n/a: Left lower leg, Right lower leg   Bathing assist Assist Level: Contact Guard/Touching assist     Upper Body Dressing/Undressing Upper body dressing   What is the patient wearing?: Pull over shirt    Upper body assist Assist Level: Set up assist    Lower Body Dressing/Undressing Lower body dressing      What is the patient wearing?: Pants, Underwear/pull up     Lower body assist Assist for lower body dressing: Contact Guard/Touching assist     Toileting Toileting    Toileting assist Assist for toileting: Contact Guard/Touching assist Assistive Device Comment: urinal   Transfers Chair/bed transfer  Transfers assist     Chair/bed transfer assist level: Contact Guard/Touching assist     Locomotion Ambulation   Ambulation assist      Assist level: Minimal Assistance - Patient > 75% Assistive device: Walker-rolling Max distance: 200   Walk 10 feet activity   Assist  Walk 10 feet activity did not occur: N/A  Assist level: Minimal Assistance - Patient > 75% Assistive device: Walker-rolling   Walk 50 feet activity   Assist Walk 50 feet with 2 turns activity did not occur: Safety/medical concerns  Assist level: Minimal Assistance - Patient > 75% Assistive device: Walker-rolling    Walk 150 feet activity   Assist Walk 150 feet activity did not occur: N/A  Assist level: Minimal Assistance - Patient > 75% Assistive device: Walker-rolling    Walk 10 feet on uneven surface  activity  Assist Walk 10 feet on uneven surfaces activity did not occur: N/A         Wheelchair     Assist Will patient use wheelchair at discharge?:  (TBD)             Wheelchair 50 feet with 2 turns activity    Assist        Assist Level: Total Assistance - Patient < 25%   Wheelchair 150 feet activity     Assist           Medical Problem List and Plan: 1.  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  Antithrombotics: -DVT/anticoagulation:   Lovenox changed to Eliquis  Doppler showing posterior tib/peroneal and gastroc DVTs left lower extremity.             -antiplatelet therapy: Aspirin 81 mg daily, Plavix 75 mg daily DC'd due to initiation of Eliquis, transition to ASA after 3 to 6 months per neurology 3. Pain Management: Tylenol as needed  Robaxin as needed  Voltaren gel ordered for right knee along with brace  Controlled 12/5 4. Mood: Provide emotional support             -antipsychotic agents: N/A 5. Neuropsych: This patient is not fully capable of making decisions on his own behalf. 6. Skin/Wound Care: Routine skin checks 7. Fluids/Electrolytes/Nutrition: Routine in and outs 8. Post stroke dysphagia.    Advanced to regular thins with intermittent supervision             Continue to advance diet as tolerated 9. Essential hypertension.  Patient on Norvasc 2.5 mg daily and Zestoretic 10-12.5 mg daily prior to admission.  Resume as needed   Vitals:   05/23/20 1928 05/24/20 0522  BP: 110/75 (!) 125/95  Pulse: 78 76  Resp: 17 16  Temp: 98.5 F (36.9 C) 98.3 F (36.8 C)  SpO2: 98% 98%   12/5 Controlled without meds until this morning. Consider resuming norvasc 2.5mg  at discharge depending upon pattern today/tomorrow  10.  Hyperlipidemia: Lipitor 11. Hiccups- Resolved  Continue baclofen 10 mg TID, decreased to 5 3 times daily on 11/17, d/ced on 11/18  Discussed with pharmacy, Thorazine DC'd on 11/30 12. Constipation  Bowel meds increased on 11/23, increased again 11/24  Improved 13.  Hypokalemia  Potassium 3.7 on 11/29 14.  Transaminitis  ALT remains elevated, but stable on 11/29 15.  Hypoalbuminemia  Supplement initiated on 11/15 16.?  CKD stage IIIa  GFR 52 on 11/29  Encourage  fluids  Continue to monitor 17.  Acute blood loss anemia  Hemoglobin 10.3 on 11/26, 10.9 on 12/3  Continue to monitor 18.  Left iliopsoas strain  MRI suggesting muscle strain versus partial tear with tendinopathy  See #3 19.  Right knee pain  Likely OA  See #3  Right knee sleeve/brace for support   LOS: 23 days A FACE TO FACE EVALUATION WAS PERFORMED  Ranelle Oyster 05/24/2020, 8:43 AM

## 2020-05-24 NOTE — Discharge Summary (Addendum)
Physician Discharge Summary  Patient ID: Jorian Willhoite MRN: [id number] DOB/AGE: [birth year] 86 y.o.  Admit date: 05/01/2020 Discharge date: 05/25/2020  Discharge Diagnoses:  Principal Problem:   CVA (cerebral vascular accident) [facility]) Active Problems:   Acute blood loss anemia   Hypoalbuminemia due to protein-calorie malnutrition (HCC)   Hypokalemia   Essential hypertension   Transaminitis   Hiccups   AKI (acute kidney injury) ([place])   Stage 3 chronic kidney disease (HCC)   Acute deep vein thrombosis (DVT) of tibial vein of left lower extremity (HCC)   Strain of left iliopsoas muscle   Chronic pain of right knee Muscle strain versus partial tear with tendinopathy left iliopsoas strain  Discharged Condition: Stable  Significant Diagnostic Studies: CT HEAD WO CONTRAST  Result Date: 04/27/2020 CLINICAL DATA:  Neuro deficit, acute stroke suspected. EXAM: CT HEAD WITHOUT CONTRAST TECHNIQUE: Contiguous axial images were obtained from the base of the skull through the vertex without intravenous contrast. COMPARISON:  None. FINDINGS: Brain: No evidence of acute large vascular territory infarction, hemorrhage, hydrocephalus, extra-axial collection or mass lesion/mass effect. Scattered white matter hypodensities, likely related to chronic microvascular ischemic disease. Mild generalized cerebral volume loss with ex vacuo ventricular dilation. Vascular: Calcific atherosclerosis. Skull: No acute fracture. Sinuses/Orbits: No acute findings. Other: No mastoid effusions. IMPRESSION: No evidence of acute intracranial abnormality. Electronically Signed   By: Margaretha Sheffield MD   On: 04/27/2020 14:38   MR ANGIO HEAD WO CONTRAST  Result Date: 04/28/2020 CLINICAL DATA:  Right-sided weakness.  Dysarthria. EXAM: MRI HEAD WITHOUT AND WITH CONTRAST MRA HEAD WITHOUT CONTRAST MRA NECK WITHOUT AND WITH CONTRAST TECHNIQUE: Multiplanar, multiecho pulse sequences of the brain and surrounding structures were obtained  without and with intravenous contrast. Angiographic images of the Circle of Willis were obtained using MRA technique without intravenous contrast. Angiographic images of the neck were obtained using MRA technique without and with intravenous contrast. Carotid stenosis measurements (when applicable) are obtained utilizing NASCET criteria, using the distal internal carotid diameter as the denominator. CONTRAST:  8.68m GADAVIST GADOBUTROL 1 MMOL/ML IV SOLN COMPARISON:  Head CT yesterday. FINDINGS: MRI HEAD FINDINGS Brain: Diffusion imaging shows acute infarction in the right lateral medulla. No other acute finding. Mild chronic small-vessel ischemic changes affect pons. Mild chronic small-vessel ischemic changes affect the cerebral hemispheric deep white matter. Generalized age related atrophy. No large vessel territory infarction. No mass lesion, hemorrhage, hydrocephalus or extra-axial collection. Vascular: Major vessels at the base of the brain show flow. Skull and upper cervical spine: Negative Sinuses/Orbits: Clear/normal Other: None MRA HEAD FINDINGS Both internal carotid arteries are widely patent through the skull base and siphon regions. The anterior and middle cerebral vessels are normal without proximal stenosis, aneurysm or vascular malformation. Large left vertebral artery patent to the basilar. No antegrade flow seen in a right vertebral artery. No basilar stenosis. Posterior circulation branch vessels are patent. MRA NECK FINDINGS Brachiocephalic vessel origins from the arch are not included. Both common carotid arteries widely patent to the bifurcation. Both carotid bifurcations appear normal. Right cervical ICA is normal. Irregularity of the medial wall of the left cervical ICA, likely due to fibromuscular disease. 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 Dominant left vertebral artery shows 30-50% stenosis at its origin but wide patency beyond that to the basilar. Right vertebral artery is a severely diseased in  thready vessel which does not show flow beyond the upper cervical region. IMPRESSION: 1. 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 2. Mild chronic small-vessel ischemic change  of the pons and cerebral hemispheric white matter otherwise. 3. Neck MR angiography does not show any significant carotid bifurcation disease. Irregularity of the medial wall of the left cervical ICA likely due to fibromuscular disease. 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 4. Dominant left vertebral artery shows 30-50% stenosis at its origin but wide patency beyond that to the basilar. Severely diseased right vertebral artery which does not show flow beyond the upper cervical region. Electronically Signed   By: Nelson Chimes M.D.   On: 04/28/2020 13:04   MR ANGIO NECK W WO CONTRAST  Result Date: 04/28/2020 CLINICAL DATA:  Right-sided weakness.  Dysarthria. EXAM: MRI HEAD WITHOUT AND WITH CONTRAST MRA HEAD WITHOUT CONTRAST MRA NECK WITHOUT AND WITH CONTRAST TECHNIQUE: Multiplanar, multiecho pulse sequences of the brain and surrounding structures were obtained without and with intravenous contrast. Angiographic images of the Circle of Willis were obtained using MRA technique without intravenous contrast. Angiographic images of the neck were obtained using MRA technique without and with intravenous contrast. Carotid stenosis measurements (when applicable) are obtained utilizing NASCET criteria, using the distal internal carotid diameter as the denominator. CONTRAST:  8.40m GADAVIST GADOBUTROL 1 MMOL/ML IV SOLN COMPARISON:  Head CT yesterday. FINDINGS: MRI HEAD FINDINGS Brain: Diffusion imaging shows acute infarction in the right lateral medulla. No other acute finding. Mild chronic small-vessel ischemic changes affect pons. Mild chronic small-vessel ischemic changes affect the cerebral hemispheric deep white matter. Generalized age related atrophy. No large vessel territory infarction. No mass lesion, hemorrhage,  hydrocephalus or extra-axial collection. Vascular: Major vessels at the base of the brain show flow. Skull and upper cervical spine: Negative Sinuses/Orbits: Clear/normal Other: None MRA HEAD FINDINGS Both internal carotid arteries are widely patent through the skull base and siphon regions. The anterior and middle cerebral vessels are normal without proximal stenosis, aneurysm or vascular malformation. Large left vertebral artery patent to the basilar. No antegrade flow seen in a right vertebral artery. No basilar stenosis. Posterior circulation branch vessels are patent. MRA NECK FINDINGS Brachiocephalic vessel origins from the arch are not included. Both common carotid arteries widely patent to the bifurcation. Both carotid bifurcations appear normal. Right cervical ICA is normal. Irregularity of the medial wall of the left cervical ICA, likely due to fibromuscular disease. 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 Dominant left vertebral artery shows 30-50% stenosis at its origin but wide patency beyond that to the basilar. Right vertebral artery is a severely diseased in thready vessel which does not show flow beyond the upper cervical region. IMPRESSION: 1. 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 2. Mild chronic small-vessel ischemic change of the pons and cerebral hemispheric white matter otherwise. 3. Neck MR angiography does not show any significant carotid bifurcation disease. Irregularity of the medial wall of the left cervical ICA likely due to fibromuscular disease. 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 4. Dominant left vertebral artery shows 30-50% stenosis at its origin but wide patency beyond that to the basilar. Severely diseased right vertebral artery which does not show flow beyond the upper cervical region. Electronically Signed   By: MNelson ChimesM.D.   On: 04/28/2020 13:04   MR BRAIN WO CONTRAST  Result Date: 04/28/2020 CLINICAL DATA:  Right-sided weakness.  Dysarthria.  EXAM: MRI HEAD WITHOUT AND WITH CONTRAST MRA HEAD WITHOUT CONTRAST MRA NECK WITHOUT AND WITH CONTRAST TECHNIQUE: Multiplanar, multiecho pulse sequences of the brain and surrounding structures were obtained without and with intravenous contrast. Angiographic images of the Circle of Willis were obtained using MRA technique without  intravenous contrast. Angiographic images of the neck were obtained using MRA technique without and with intravenous contrast. Carotid stenosis measurements (when applicable) are obtained utilizing NASCET criteria, using the distal internal carotid diameter as the denominator. CONTRAST:  8.9m GADAVIST GADOBUTROL 1 MMOL/ML IV SOLN COMPARISON:  Head CT yesterday. FINDINGS: MRI HEAD FINDINGS Brain: Diffusion imaging shows acute infarction in the right lateral medulla. No other acute finding. Mild chronic small-vessel ischemic changes affect pons. Mild chronic small-vessel ischemic changes affect the cerebral hemispheric deep white matter. Generalized age related atrophy. No large vessel territory infarction. No mass lesion, hemorrhage, hydrocephalus or extra-axial collection. Vascular: Major vessels at the base of the brain show flow. Skull and upper cervical spine: Negative Sinuses/Orbits: Clear/normal Other: None MRA HEAD FINDINGS Both internal carotid arteries are widely patent through the skull base and siphon regions. The anterior and middle cerebral vessels are normal without proximal stenosis, aneurysm or vascular malformation. Large left vertebral artery patent to the basilar. No antegrade flow seen in a right vertebral artery. No basilar stenosis. Posterior circulation branch vessels are patent. MRA NECK FINDINGS Brachiocephalic vessel origins from the arch are not included. Both common carotid arteries widely patent to the bifurcation. Both carotid bifurcations appear normal. Right cervical ICA is normal. Irregularity of the medial wall of the left cervical ICA, likely due to  fibromuscular disease. 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 Dominant left vertebral artery shows 30-50% stenosis at its origin but wide patency beyond that to the basilar. Right vertebral artery is a severely diseased in thready vessel which does not show flow beyond the upper cervical region. IMPRESSION: 1. 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 2. Mild chronic small-vessel ischemic change of the pons and cerebral hemispheric white matter otherwise. 3. Neck MR angiography does not show any significant carotid bifurcation disease. Irregularity of the medial wall of the left cervical ICA likely due to fibromuscular disease. 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 4. Dominant left vertebral artery shows 30-50% stenosis at its origin but wide patency beyond that to the basilar. Severely diseased right vertebral artery which does not show flow beyond the upper cervical region. Electronically Signed   By: MNelson ChimesM.D.   On: 04/28/2020 13:04   MR PELVIS WO CONTRAST  Result Date: 05/12/2020 CLINICAL DATA:  Left lower quadrant abdominal pain. Possible hernia. EXAM: MRI PELVIS WITHOUT CONTRAST TECHNIQUE: Multiplanar multisequence MR imaging of the pelvis was performed. No intravenous contrast was administered. COMPARISON:  None. FINDINGS: Examination limited by patient motion. Patient would not or could not hold still for the examination. Urinary Tract: The bladder is unremarkable. No bladder mass, calculi or asymmetric bladder wall thickening. Mild uniform bladder wall thickening could be due to lack of distension or possibly partial bladder outlet obstruction. Bowel: Large amount of stool in the rectum could suggest fecal impaction. No rectal mass. The visualized sigmoid colon is grossly normal and the visualized small-bowel loops are grossly normal. Vascular/Lymphatic: No aneurysm or lymphadenopathy. Reproductive: The prostate gland is within normal limits in size for age. No lesions are  identified. The seminal vesicles appear normal. Other:  No inguinal mass, adenopathy or inguinal hernia. Musculoskeletal: Edema like signal changes in the left iliopsoas muscle along with tendinopathy and involving the iliopsoas tendon. Some surrounding fluid is also noted. Both hips are normally located. No significant degenerative changes, stress fracture or AVN. The pubic symphysis and SI joints are intact. No pelvic fractures or bone lesions. IMPRESSION: 1. Left iliopsoas muscle edema suggesting a muscle strain or  partial tear. There is also tendinopathy involving the iliopsoas tendon but no complete tear/rupture. 2. Large amount of stool in the rectum could suggest fecal impaction. 3. No pelvic fractures or bone lesions. 4. No inguinal mass or hernia. Electronically Signed   By: Marijo Sanes M.D.   On: 05/12/2020 07:43   DG Swallowing Func-Speech Pathology  Result Date: 05/12/2020 Objective Swallowing Evaluation: Type of Study: MBS-Modified Barium Swallow Study  Patient Details Name: Mert Dietrick MRN: [id number] Date of Birth: 11/19/1933 Today's Date: 05/12/2020 Past Medical History: Past Medical History: Diagnosis Date  Hypertension  Past Surgical History: No past surgical history on file. HPI: See H&P  Subjective: alert, with spouse at bedside Assessment / Plan / Recommendation CHL IP CLINICAL IMPRESSIONS 05/12/2020 Clinical Impression Patient's swallowing function appears much improved since initial MBS on 11/10. Patient demonstrates a mild oropharyngeal dysphagia characterized by prolonged mastication and AP transit with premature spillage to the valleculae and delayed swallow initiation that triggers mostly at the pyriform sinuses with thin liquids and at the velleculae with solids.  However, patient was able to fully protect his airway without any instances of penetration or aspiration observed. Patient also demonstrated very minimal pharyngeal residue with solids and liquids. Recommend patient  continued Dys. 3 textures and upgrade to thin liquids. Patient verbalized understanding and agreement. SLP Visit Diagnosis Dysphagia, oropharyngeal phase (R13.12) Attention and concentration deficit following -- Frontal lobe and executive function deficit following -- Impact on safety and function Mild aspiration risk   CHL IP TREATMENT RECOMMENDATION 05/12/2020 Treatment Recommendations Therapy as outlined in treatment plan below   Prognosis 05/12/2020 Prognosis for Safe Diet Advancement Good Barriers to Reach Goals -- Barriers/Prognosis Comment -- CHL IP DIET RECOMMENDATION 05/12/2020 SLP Diet Recommendations Dysphagia 3 (Mech soft) solids;Thin liquid Liquid Administration via Cup;Straw Medication Administration Crushed with puree Compensations Minimize environmental distractions;Slow rate;Small sips/bites Postural Changes Seated upright at 90 degrees   CHL IP OTHER RECOMMENDATIONS 05/12/2020 Recommended Consults -- Oral Care Recommendations Oral care BID Other Recommendations --   CHL IP FOLLOW UP RECOMMENDATIONS 05/12/2020 Follow up Recommendations Inpatient Rehab   CHL IP FREQUENCY AND DURATION 05/12/2020 Speech Therapy Frequency (ACUTE ONLY) min 3x week Treatment Duration 2 weeks      CHL IP ORAL PHASE 05/12/2020 Oral Phase Impaired Oral - Pudding Teaspoon -- Oral - Pudding Cup -- Oral - Honey Teaspoon -- Oral - Honey Cup -- Oral - Nectar Teaspoon -- Oral - Nectar Cup -- Oral - Nectar Straw -- Oral - Thin Teaspoon Premature spillage;Delayed oral transit Oral - Thin Cup Delayed oral transit;Premature spillage Oral - Thin Straw Premature spillage;Delayed oral transit Oral - Puree Delayed oral transit Oral - Mech Soft -- Oral - Regular Delayed oral transit;Impaired mastication Oral - Multi-Consistency -- Oral - Pill -- Oral Phase - Comment --  CHL IP PHARYNGEAL PHASE 05/12/2020 Pharyngeal Phase Impaired Pharyngeal- Pudding Teaspoon -- Pharyngeal -- Pharyngeal- Pudding Cup -- Pharyngeal -- Pharyngeal- Honey  Teaspoon -- Pharyngeal -- Pharyngeal- Honey Cup -- Pharyngeal -- Pharyngeal- Nectar Teaspoon -- Pharyngeal -- Pharyngeal- Nectar Cup -- Pharyngeal -- Pharyngeal- Nectar Straw NT Pharyngeal -- Pharyngeal- Thin Teaspoon Delayed swallow initiation-pyriform sinuses Pharyngeal -- Pharyngeal- Thin Cup Delayed swallow initiation-pyriform sinuses Pharyngeal Material does not enter airway Pharyngeal- Thin Straw Delayed swallow initiation-pyriform sinuses Pharyngeal Material does not enter airway Pharyngeal- Puree Delayed swallow initiation-vallecula Pharyngeal -- Pharyngeal- Mechanical Soft -- Pharyngeal -- Pharyngeal- Regular Delayed swallow initiation-vallecula Pharyngeal -- Pharyngeal- Multi-consistency -- Pharyngeal -- Pharyngeal- Pill -- Pharyngeal -- Pharyngeal Comment --  CHL IP CERVICAL ESOPHAGEAL PHASE 05/12/2020 Cervical Esophageal Phase WFL Pudding Teaspoon -- Pudding Cup -- Honey Teaspoon -- Honey Cup -- Nectar Teaspoon -- Nectar Cup -- Nectar Straw -- Thin Teaspoon -- Thin Cup -- Thin Straw -- Puree -- Mechanical Soft -- Regular -- Multi-consistency -- Pill -- Cervical Esophageal Comment -- PAYNE, COURTNEY 05/12/2020, 10:08 AM          Weston Anna, MA, CCC-SLP [phone]     DG Swallowing Func-Speech Pathology  Result Date: 04/29/2020 Completed by Greggory Keen, SLP Student Supervised and reviewed by Herbie Baltimore MA CCC-SLP Objective Swallowing Evaluation: Type of Study: MBS-Modified Barium Swallow Study  Patient Details Name: Garth Diffley MRN: [id number] Date of Birth: [birth year] Today's Date: 04/29/2020 Time: SLP Start Time (ACUTE ONLY): 0930 -SLP Stop Time (ACUTE ONLY): 0951 SLP Time Calculation (min) (ACUTE ONLY): 21 min Past Medical History: Past Medical History: Diagnosis Date  Hypertension  Past Surgical History: No past surgical history on file. HPI: Yohannes Waibel is a 86 y.o. male with PMH significant for HTN who presents with right sided weakness, unsteady gait and difficulty with speech and trouble  swallowing and persistent hiccups. 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 Mild chronic small-vessel ischemic change of the pons and cerebral hemispheric white matter otherwise.  Subjective: alert, with spouse at bedside Assessment / Plan / Recommendation CHL IP CLINICAL IMPRESSIONS 04/29/2020 Clinical Impression Pt was seen for MBS and demonstrates a moderate oropharyngeal dysphagia characterized by premature spillage, instances of penetration/aspiration, pharyngeal residue, and impaired UES opening secondary to reduced movement and duration of hyolaryngeal excursion. Premature spillage was observed with thin liquid as the material fell to the level of the pyriforms. Prior to initiating the swallow, a trace amount of thin liquid was penetrated into the laryngeal vestibule above the level of the vocal folds and was subsequently ejected. During the swallow, additional thin liquid fell below the level of the vocal folds and the pt demonstrated an immediate cough in attempt to eject the aspirate. The pt also demonstrated silent aspiration of thin liquid while attempting a chin tuck. No instances of penetration/aspiration occurred with any other POs. The pt demonstrates significant pharyngeal residue across POs in the valleculae, lateral channels, and pyriforms. In order to address pharyngeal residue, multiple swallows (3) were utilized. Additional compensatory strategies were attempted to address residue but were unsuccessful. Recommend dys 3 diet with nectar thick liquids and use of multiple swallows. SLP will continue to f/u acutely for diet toleration and advancement. SLP Visit Diagnosis Dysphagia, oropharyngeal phase (R13.12) Attention and concentration deficit following -- Frontal lobe and executive function deficit following -- Impact on safety and function Moderate aspiration risk   CHL IP TREATMENT RECOMMENDATION 04/29/2020 Treatment Recommendations Therapy as outlined in  treatment plan below   Prognosis 04/29/2020 Prognosis for Safe Diet Advancement Good Barriers to Reach Goals Time post onset Barriers/Prognosis Comment -- CHL IP DIET RECOMMENDATION 04/29/2020 SLP Diet Recommendations Dysphagia 3 (Mech soft) solids;Nectar thick liquid Liquid Administration via Cup Medication Administration Crushed with puree Compensations Multiple dry swallows after each bite/sip Postural Changes Seated upright at 90 degrees   CHL IP OTHER RECOMMENDATIONS 04/29/2020 Recommended Consults -- Oral Care Recommendations Oral care BID Other Recommendations --   No flowsheet data found.  CHL IP FREQUENCY AND DURATION 04/29/2020 Speech Therapy Frequency (ACUTE ONLY) min 2x/week Treatment Duration 2 weeks      CHL IP ORAL PHASE 04/29/2020 Oral Phase Impaired Oral - Pudding Teaspoon -- Oral - Pudding Cup --  Oral - Honey Teaspoon -- Oral - Honey Cup -- Oral - Nectar Teaspoon -- Oral - Nectar Cup -- Oral - Nectar Straw -- Oral - Thin Teaspoon -- Oral - Thin Cup Premature spillage;Delayed oral transit Oral - Thin Straw Premature spillage;Delayed oral transit Oral - Puree WFL Oral - Mech Soft -- Oral - Regular WFL Oral - Multi-Consistency -- Oral - Pill -- Oral Phase - Comment --  CHL IP PHARYNGEAL PHASE 04/29/2020 Pharyngeal Phase Impaired Pharyngeal- Pudding Teaspoon -- Pharyngeal -- Pharyngeal- Pudding Cup -- Pharyngeal -- Pharyngeal- Honey Teaspoon -- Pharyngeal -- Pharyngeal- Honey Cup -- Pharyngeal -- Pharyngeal- Nectar Teaspoon -- Pharyngeal -- Pharyngeal- Nectar Cup -- Pharyngeal -- Pharyngeal- Nectar Straw Pharyngeal residue - valleculae;Pharyngeal residue - pyriform;Reduced anterior laryngeal mobility;Reduced laryngeal elevation;Lateral channel residue;Compensatory strategies attempted (with notebox) Pharyngeal -- Pharyngeal- Thin Teaspoon -- Pharyngeal -- Pharyngeal- Thin Cup Reduced anterior laryngeal mobility;Reduced laryngeal elevation;Pharyngeal residue - valleculae;Lateral channel  residue;Pharyngeal residue - pyriform;Penetration/Aspiration before swallow;Trace aspiration Pharyngeal Material enters airway, remains ABOVE vocal cords then ejected out Pharyngeal- Thin Straw Lateral channel residue;Pharyngeal residue - pyriform;Pharyngeal residue - valleculae;Reduced anterior laryngeal mobility;Reduced laryngeal elevation;Penetration/Aspiration during swallow;Trace aspiration Pharyngeal Material enters airway, remains ABOVE vocal cords then ejected out Pharyngeal- Puree Lateral channel residue;Pharyngeal residue - pyriform;Pharyngeal residue - valleculae;Reduced laryngeal elevation;Reduced anterior laryngeal mobility Pharyngeal -- Pharyngeal- Mechanical Soft -- Pharyngeal -- Pharyngeal- Regular Reduced anterior laryngeal mobility;Reduced laryngeal elevation;Pharyngeal residue - valleculae;Pharyngeal residue - pyriform;Lateral channel residue Pharyngeal -- Pharyngeal- Multi-consistency -- Pharyngeal -- Pharyngeal- Pill -- Pharyngeal -- Pharyngeal Comment --  CHL IP CERVICAL ESOPHAGEAL PHASE 04/29/2020 Cervical Esophageal Phase Impaired Pudding Teaspoon -- Pudding Cup -- Honey Teaspoon -- Honey Cup -- Nectar Teaspoon -- Nectar Cup -- Nectar Straw -- Thin Teaspoon -- Thin Cup -- Thin Straw -- Puree -- Mechanical Soft -- Regular -- Multi-consistency -- Pill -- Cervical Esophageal Comment -- Lynann Beaver 04/29/2020, 10:44 AM              ECHOCARDIOGRAM COMPLETE  Result Date: 04/28/2020    ECHOCARDIOGRAM REPORT   Patient Name:   [facility] Date of Exam: 04/28/2020 Medical Rec #:  [id number]    Height:       73.0 in Accession #:    [id number]   Weight:       185.0 lb Date of Birth:  10-Jan-1934    BSA:          2.082 m Patient Age:    51 years     BP:           125/80 mmHg Patient Gender: M            HR:           80 bpm. Exam Location:  Inpatient Procedure: 2D Echo Indications:    stroke 434.91  History:        Patient has no prior history of Echocardiogram examinations.                  Risk Factors:Hypertension.  Sonographer:    Jannett Celestine RDCS (AE) Referring Phys: [place]  1. Left ventricular ejection fraction, by estimation, is 60 to 65%. The left ventricle has normal function. The left ventricle has no regional wall motion abnormalities. Left ventricular diastolic parameters are consistent with Grade I diastolic dysfunction (impaired relaxation).  2. Right ventricular systolic function is normal. The right ventricular size is normal.  3. The mitral valve is normal in structure. No evidence of mitral valve regurgitation.  No evidence of mitral stenosis.  4. The aortic valve is normal in structure. Aortic valve regurgitation is not visualized. No aortic stenosis is present.  5. The inferior vena cava is normal in size with greater than 50% respiratory variability, suggesting right atrial pressure of 3 mmHg. FINDINGS  Left Ventricle: Left ventricular ejection fraction, by estimation, is 60 to 65%. The left ventricle has normal function. The left ventricle has no regional wall motion abnormalities. The left ventricular internal cavity size was normal in size. There is  no left ventricular hypertrophy. Left ventricular diastolic parameters are consistent with Grade I diastolic dysfunction (impaired relaxation). Normal left ventricular filling pressure. Right Ventricle: The right ventricular size is normal. No increase in right ventricular wall thickness. Right ventricular systolic function is normal. Left Atrium: Left atrial size was normal in size. Right Atrium: Right atrial size was normal in size. Pericardium: There is no evidence of pericardial effusion. Mitral Valve: The mitral valve is normal in structure. No evidence of mitral valve regurgitation. No evidence of mitral valve stenosis. Tricuspid Valve: The tricuspid valve is normal in structure. Tricuspid valve regurgitation is not demonstrated. No evidence of tricuspid stenosis. Aortic Valve: The aortic valve is  normal in structure. Aortic valve regurgitation is not visualized. No aortic stenosis is present. Pulmonic Valve: The pulmonic valve was normal in structure. Pulmonic valve regurgitation is not visualized. No evidence of pulmonic stenosis. Aorta: The aortic root is normal in size and structure. Venous: The inferior vena cava is normal in size with greater than 50% respiratory variability, suggesting right atrial pressure of 3 mmHg. IAS/Shunts: No atrial level shunt detected by color flow Doppler.  LEFT VENTRICLE PLAX 2D LVIDd:         3.60 cm  Diastology LVIDs:         2.30 cm  LV e' medial:    3.05 cm/s LV PW:         1.10 cm  LV E/e' medial:  15.3 LV IVS:        0.90 cm  LV e' lateral:   7.29 cm/s LVOT diam:     2.10 cm  LV E/e' lateral: 6.4 LV SV:         47 LV SV Index:   23 LVOT Area:     3.46 cm  RIGHT VENTRICLE RV S prime:     12.50 cm/s TAPSE (M-mode): 1.6 cm LEFT ATRIUM           Index LA diam:      2.90 cm 1.39 cm/m LA Vol (A2C): 25.6 ml 12.30 ml/m  AORTIC VALVE LVOT Vmax:   64.60 cm/s LVOT Vmean:  51.900 cm/s LVOT VTI:    0.136 m  AORTA Ao Root diam: 3.20 cm MITRAL VALVE MV Area (PHT): 2.73 cm    SHUNTS MV Decel Time: 278 msec    Systemic VTI:  0.14 m MV E velocity: 46.70 cm/s  Systemic Diam: 2.10 cm MV A velocity: 61.30 cm/s MV E/A ratio:  0.76 Mihai Croitoru MD Electronically signed by Sanda Klein MD Signature Date/Time: 04/28/2020/2:42:34 PM    Final    VAS [place] LOWER EXTREMITY VENOUS (DVT)  Result Date: 05/08/2020  Lower Venous DVT Study Indications: Left lower extremity intermittent pain. Other Indications: Recent CVA, right lower extremity weakness. Comparison Study: No prior study Performing Technologist: Maudry Mayhew MHA, RDMS, RVT, RDCS  Examination Guidelines: A complete evaluation includes B-mode imaging, spectral Doppler, color Doppler, and power Doppler as needed of all accessible portions of  each vessel. Bilateral testing is considered an integral part of a complete  examination. Limited examinations for reoccurring indications may be performed as noted. The reflux portion of the exam is performed with the patient in reverse Trendelenburg.  +-----+---------------+---------+-----------+----------+--------------+ RIGHTCompressibilityPhasicitySpontaneityPropertiesThrombus Aging +-----+---------------+---------+-----------+----------+--------------+ CFV  Full           Yes      Yes                                 +-----+---------------+---------+-----------+----------+--------------+   +---------+---------------+---------+-----------+----------+--------------+ LEFT     CompressibilityPhasicitySpontaneityPropertiesThrombus Aging +---------+---------------+---------+-----------+----------+--------------+ CFV      Full           Yes      Yes                                 +---------+---------------+---------+-----------+----------+--------------+ SFJ      Full                                                        +---------+---------------+---------+-----------+----------+--------------+ FV Prox  Full                                                        +---------+---------------+---------+-----------+----------+--------------+ FV Mid   Full                                                        +---------+---------------+---------+-----------+----------+--------------+ FV DistalFull                                                        +---------+---------------+---------+-----------+----------+--------------+ PFV      Full                                                        +---------+---------------+---------+-----------+----------+--------------+ POP      Full           Yes      Yes                                 +---------+---------------+---------+-----------+----------+--------------+ PTV      None                    No                   Acute           +---------+---------------+---------+-----------+----------+--------------+ PERO     None  No                   Acute          +---------+---------------+---------+-----------+----------+--------------+ Gastroc  None                    No                   Acute          +---------+---------------+---------+-----------+----------+--------------+     Summary: RIGHT: - No evidence of common femoral vein obstruction.  LEFT: - Findings consistent with acute deep vein thrombosis involving the left posterior tibial veins, left peroneal veins, and left gastrocnemius veins. - No cystic structure found in the popliteal fossa.  *See table(s) above for measurements and observations. Electronically signed by Deitra Mayo MD on 05/08/2020 at 3:54:30 PM.    Final     Labs:  Basic Metabolic Panel: Recent Labs  Lab 05/18/20 0629  NA 139  K 3.7  CL 104  CO2 28  GLUCOSE 97  BUN 18  CREATININE 1.34*  CALCIUM 8.9    CBC: Recent Labs  Lab 05/22/20 0455  WBC 5.7  NEUTROABS 2.7  HGB 10.9*  HCT 33.1*  MCV 89.5  PLT 341    CBG: No results for input(s): GLUCAP in the last 168 hours.  Family history.  Positive for hypertension and hyperlipidemia.  Denies any colon cancer esophageal cancer or rectal cancer  Brief HPI:   Alaric Gladwin is a 86 y.o. right-handed male with history of hypertension.  Patient lives in [place] with his daughter who works at discharge.  Two-level home half bath on main level.  Reportedly independent prior to admission.  Up until recently patient had been living in [place] alone driving doing his own grocery shopping and yard work.  He has a daughter here in the area that works.  Presented 04/28/2020 with right hemiparesis and dysarthria.  Cranial CT scan negative for acute changes.  Patient did not receive TPA.  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  MRA  angiography does not show significant carotid bifurcation disease.  Admission chemistries glucose 172 creatinine 1.44 alcohol negative urine drug screen negative hemoglobin A1c of 6.  Echocardiogram with ejection fraction of 60 to 65% no wall motion abnormality grade 1 diastolic dysfunction.  Maintained on aspirin and Plavix for CVA prophylaxis x3 weeks then aspirin alone.  Subcutaneous Lovenox for DVT prophylaxis.  Hospital course complicated by dysphagia maintained on mechanical soft nectar thick liquids.  Therapy evaluations completed and patient was admitted for a comprehensive rehab program   Hospital Course: Jumar Greenstreet was admitted to rehab 05/01/2020 for inpatient therapies to consist of PT, ST and OT at least three hours five days a week. Past admission physiatrist, therapy team and rehab RN have worked together to provide customized collaborative inpatient rehab.  Pertaining to patient's right lateral medullary infarction remained stable.  Initially on aspirin and Plavix for CVA prophylaxis however patient developed a posterior tibial peroneal and gastrocnemius DVT left lower extremity identified on vascular study thus he was transition to Eliquis and can go back to aspirin after 3 to 6 months per neurology services.  Pain managed with use of Voltaren gel as directed as well as Robaxin as needed.  His diet has been advanced to regular.  Blood pressure monitored patient had been on Norvasc and Zestoretic prior to admission is could be  resumed as needed.  Bouts of constipation resolved with laxative assistance.  CKD stage III GFR 52 on 05/18/2020.  Patient did have a left iliopsoas muscle strain versus partial tear with tendinopathy per MRI conservative care he remained on Voltaren gel and Robaxin as needed.   Blood pressures were monitored on TID basis and controlled     Rehab course: During patient's stay in rehab weekly team conferences were held to monitor patient's progress, set goals and  discuss barriers to discharge. At admission, patient required moderate assist stand pivot transfers minimal assist lateral scoot transfers minimal assist sit to side-lying.  Moderate assist upper body bathing mod assist lower body bathing moderate assist upper body dressing moderate assist lower body dressing  Physical exam.  Blood pressure 113/81 pulse 81 temperature 97.6 respirations 14 oxygen saturations 98% room air Constitutional.  No acute distress HEENT Head.  Normocephalic and atraumatic Eyes.  Pupils round and reactive to light no discharge without nystagmus Neck.  Supple nontender no JVD without thyromegaly Cardiac regular rate rhythm without extra sounds or murmur heard Abdomen.  Soft nontender positive bowel sounds without rebound Respiratory effort normal no respiratory distress without wheeze Skin.  Warm and dry Neurologic.  Alert oriented makes eye contact with examiner follows commands moderate dysarthria. Motor.  5/5 throughout mild facial weakness  He/ has had improvement in activity tolerance, balance, postural control as well as ability to compensate for deficits. He/ has had improvement in functional use RUE/LUE  and RLE/LLE as well as improvement in awareness.  Patient ambulates 200 feet contact-guard minimal assist rolling walker.  Also performed ambulation without assistive device to allow family to witness right pushing and level of assist required.  Perform stair training going up and down 4 steps min mod assist on left handrail support.  He performed car transfers are contact-guard assist with rolling walker with car height simulated to midsize SUV.  Patient met 10 out of 10 long-term goals due to improved activity tolerance improved balance and postural control ability to compensate for deficits improved awareness improved coordination.  Supervision upper body bathing supervision lower body bathing set up upper body dressing supervision lower body dressing full family  teaching completed plan discharge to home       Disposition: Discharge to home    Diet: Regular  Special Instructions: No driving smoking or alcohol  Continue Eliquis for approximately 3 to 6 months and then transition to aspirin for CVA prophylaxis per neurology services  Medications at discharge. 1.  Tylenol as needed 2.  Eliquis 5 mg twice daily 3.  Lipitor 80 mg daily 4.  Voltaren gel 2 g 4 times daily as needed to affected area 5.  Robaxin 500 mg every 8 hours as needed 6.  Protonix 40 mg p.o. daily 7.  MiraLAX twice daily hold for loose stools  30-35 minutes were spent completing discharge summary and discharge planning  Discharge Instructions     Ambulatory referral to Neurology   Complete by: As directed    An appointment is requested in approximately 4 weeks right lateral medullary infarction   Ambulatory referral to Physical Medicine Rehab   Complete by: As directed    Moderate complexity follow-up 1 to 2 weeks right lateral medullary infarction        Follow-up Information     Jamse Arn, MD Follow up.   Specialty: Physical Medicine and Rehabilitation Why: Office to call for appointment Contact information: [address] [place] [address]  [phone]                 Signed: Lavon Paganini Cordova 05/25/2020, 5:11 AM Patient was seen, face-face, and physical exam performed by me on day of discharge, greater than 30 minutes of total time spent.. Please see progress note from day of discharge as well.  Delice Lesch, MD, ABPMR

## 2020-05-25 DIAGNOSIS — I6330 Cerebral infarction due to thrombosis of unspecified cerebral artery: Secondary | ICD-10-CM

## 2020-05-25 MED ORDER — ATORVASTATIN CALCIUM 80 MG PO TABS: 80.0000 mg | ORAL_TABLET | Freq: Every day | ORAL | 0 refills | Status: AC

## 2020-05-25 MED ORDER — METHOCARBAMOL 500 MG PO TABS: 500.0000 mg | ORAL_TABLET | Freq: Three times a day (TID) | ORAL | 0 refills | Status: DC | PRN

## 2020-05-25 MED ORDER — ACETAMINOPHEN 325 MG PO TABS: 650.0000 mg | ORAL_TABLET | ORAL | Status: DC | PRN

## 2020-05-25 MED ORDER — POLYETHYLENE GLYCOL 3350 17 G PO PACK: 17.0000 g | PACK | Freq: Two times a day (BID) | ORAL | 0 refills | Status: DC

## 2020-05-25 MED ORDER — APIXABAN 5 MG PO TABS: 5.0000 mg | ORAL_TABLET | Freq: Two times a day (BID) | ORAL | 1 refills | Status: DC

## 2020-05-25 MED ORDER — DICLOFENAC SODIUM 1 % EX GEL: 2.0000 g | Freq: Four times a day (QID) | CUTANEOUS | Status: DC | PRN

## 2020-05-25 MED ORDER — DICLOFENAC SODIUM 1 % EX GEL: 2.0000 g | Freq: Four times a day (QID) | CUTANEOUS | 0 refills | Status: DC | PRN

## 2020-05-25 MED ORDER — PANTOPRAZOLE SODIUM 40 MG PO TBEC: 40.0000 mg | DELAYED_RELEASE_TABLET | Freq: Every day | ORAL | 0 refills | Status: DC

## 2020-05-25 NOTE — Progress Notes (Signed)
[facility] PHYSICAL MEDICINE & REHABILITATION PROGRESS NOTE   Subjective/Complaints: Patient states he slept well overnight.  He is eager for discharge.  He is appreciative of his care.  ROS: Denies CP, SOB, N/V/D  Objective:   No results found. No results for input(s): WBC, HGB, HCT, PLT in the last 72 hours. No results for input(s): NA, K, CL, CO2, GLUCOSE, BUN, CREATININE, CALCIUM in the last 72 hours.  Intake/Output Summary (Last 24 hours) at 05/25/2020 0858 Last data filed at 05/25/2020 0753 Gross per 24 hour  Intake 960 ml  Output 1350 ml  Net -390 ml   Physical Exam: Vital Signs Blood pressure 122/80, pulse 78, temperature 98.6 F (37 C), temperature source Oral, resp. rate 16, height 6\' 1"  (1.854 m), weight 76.3 kg, SpO2 100 %.  Constitutional: No distress . Vital signs reviewed. HENT: Normocephalic.  Atraumatic. Eyes: EOMI. No discharge. Cardiovascular: No JVD.  RRR. Respiratory: Normal effort.  No stridor.  Bilateral clear to auscultation. GI: Non-distended.  BS +. Skin: Warm and dry.  Intact. Psych: Normal mood.  Normal behavior. Musc: No edema in extremities.  No tenderness in extremities. Neuro: Alert Mild dysarthria, unchanged Motor: 5/5 throughout, unchanged Mild facial weakness   Assessment/Plan: 1. Functional deficits which require 3+ hours per day of interdisciplinary therapy in a comprehensive inpatient rehab setting.  Physiatrist is providing close team supervision and 24 hour management of active medical problems listed below.  Physiatrist and rehab team continue to assess barriers to discharge/monitor patient progress toward functional and medical goals  Care Tool:  Bathing    Body parts bathed by patient: Right arm, Left arm, Chest, Abdomen, Front perineal area, Right upper leg, Left upper leg, Face, Buttocks, Left lower leg, Right lower leg   Body parts bathed by helper: Buttocks Body parts n/a: Left lower leg, Right lower leg   Bathing  assist Assist Level: Contact Guard/Touching assist     Upper Body Dressing/Undressing Upper body dressing   What is the patient wearing?: Pull over shirt    Upper body assist Assist Level: Set up assist    Lower Body Dressing/Undressing Lower body dressing      What is the patient wearing?: Pants, Underwear/pull up     Lower body assist Assist for lower body dressing: Contact Guard/Touching assist     Toileting Toileting    Toileting assist Assist for toileting: Contact Guard/Touching assist Assistive Device Comment: urinal   Transfers Chair/bed transfer  Transfers assist     Chair/bed transfer assist level: Contact Guard/Touching assist Chair/bed transfer assistive device:   Ambulation assist      Assist level: Minimal Assistance - Patient > 75% Assistive device: Walker-rolling Max distance: 200 ft   Walk 10 feet activity   Assist  Walk 10 feet activity did not occur: N/A  Assist level: Contact Guard/Touching assist Assistive device: Walker-rolling   Walk 50 feet activity   Assist Walk 50 feet with 2 turns activity did not occur: Safety/medical concerns  Assist level: Minimal Assistance - Patient > 75% Assistive device: Walker-rolling    Walk 150 feet activity   Assist Walk 150 feet activity did not occur: N/A  Assist level: Minimal Assistance - Patient > 75% Assistive device: Walker-rolling    Walk 10 feet on uneven surface  activity   Assist Walk 10 feet on uneven surfaces activity did not occur: N/A   Assist level: Minimal Assistance - Patient > 75% Assistive device: Geologist, engineering  Assist Will patient use wheelchair at discharge?:  (TBD) Type of Wheelchair: Manual    Wheelchair assist level: Supervision/Verbal cueing Max wheelchair distance: 250 ft    Wheelchair 50 feet with 2 turns activity    Assist        Assist Level: Supervision/Verbal cueing    Wheelchair 150 feet activity     Assist      Assist Level: Supervision/Verbal cueing   Medical Problem List and Plan: 1.  Dysarthria/dysphagia secondary to right lateral medullary infarct  DC today  Will see patient for hospital follow-up in 1 month post-discharge 2.  Antithrombotics: -DVT/anticoagulation:   Lovenox changed to Eliquis  Doppler showing posterior tib/peroneal and gastroc DVTs left lower extremity.             -antiplatelet therapy: Aspirin 81 mg daily, Plavix 75 mg daily DC'd due to initiation of Eliquis, transition to ASA after 3 to 6 months per neurology 3. Pain Management: Tylenol as needed  Robaxin as needed  Voltaren gel ordered for right knee along with brace  Controlled 12/6 4. Mood: Provide emotional support             -antipsychotic agents: N/A 5. Neuropsych: This patient is not fully capable of making decisions on his own behalf. 6. Skin/Wound Care: Routine skin checks 7. Fluids/Electrolytes/Nutrition: Routine in and outs 8. Post stroke dysphagia.    Advanced to regular thins with intermittent supervision             Continue to advance diet as tolerated 9. Essential hypertension.  Patient on Norvasc 2.5 mg daily and Zestoretic 10-12.5 mg daily prior to admission.  Resume as needed   Vitals:   05/24/20 1922 05/25/20 0356  BP: 121/73 122/80  Pulse: 74 78  Resp: 17 16  Temp: 98.2 F (36.8 C) 98.6 F (37 C)  SpO2: 100% 100%   Controlled off meds on 12/6 10.  Hyperlipidemia: Lipitor 11. Hiccups- Resolved  Continue baclofen 10 mg TID, decreased to 5 3 times daily on 11/17, d/ced on 11/18  Discussed with pharmacy, Thorazine DC'd on 11/30 12. Constipation  Bowel meds increased on 11/23, increased again 11/24  Improved 13.  Hypokalemia  Potassium 3.7 on 11/29 14.  Transaminitis  ALT remains elevated, but stable on 11/29 15.  Hypoalbuminemia  Supplement initiated on 11/15 16.?  CKD stage IIIa  GFR 52 on 11/29  Encourage fluids  Continue  to monitor 17.  Acute blood loss anemia  Hemoglobin 10.9 on 12/3  Continue to monitor 18.  Left iliopsoas strain  MRI suggesting muscle strain versus partial tear with tendinopathy  See #3 19.  Right knee pain  Likely OA  See #3  Right knee sleeve/brace for support  > 30 minutes spent in total in discharge planning between myself and PA regarding aforementioned, as well discussion regarding DME equipment, follow-up appointments, follow-up therapies, discharge medications, discharge recommendations  LOS: 24 days A FACE TO FACE EVALUATION WAS PERFORMED  Tyler Glass [name] 05/25/2020, 8:58 AM

## 2020-05-25 NOTE — Progress Notes (Signed)
Inpatient Rehabilitation Care Coordinator  Discharge Note  The overall goal for the admission was met for:   Discharge location: Yes-HOME WITH DAUGHTER WITH WIFE ASSISTING WITH HIS CARE-24 HR  Length of Stay: Yes-24 DAYS  Discharge activity level: Yes-SUPERVISION-CGA LEVEL ASSIST  Home/community participation: Yes  Services provided included: MD, RD, PT, OT, SLP, RN, CM, Pharmacy, Neuropsych and SW  Financial Services: Private Insurance: [employer]  Follow-up services arranged: Home Health: [facility]-PT,OT,SP, DME: [facility]-RW AND 3 IN 1 and Patient/Family has no preference for HH/DME agencies  Comments (or additional information):DAUGHTER AND WIFE WERE HERE FOR EDUCATION AND FEEL COMFORTABLE WITH THE CARE HE REQUIRES AT DC. DAUGHTER TO WORK ON GETTING HIM A PCP SHE [facility]. SHE AND MOM TO SET THIS UP  Patient/Family verbalized understanding of follow-up arrangements: Yes  Individual responsible for coordination of the follow-up plan: ALIYA-DAUGHTER [phone]  Confirmed correct DME delivered: Elease Hashimoto 05/25/2020    Elease Hashimoto

## 2020-05-25 NOTE — Progress Notes (Signed)
Patient education given by Deatra Ina PA to patient and patient's daughter.  All personable belongings gathered.  Patient has no complaints at this time.  Patient or family member have no questions at this time.

## 2020-05-27 ENCOUNTER — Telehealth: Payer: Self-pay

## 2020-05-27 NOTE — Telephone Encounter (Signed)
Transitional Care Call--who you spoke with Mr. Kaylon Hitz & Loney Laurence (daughter)    1. Are you/is patient experiencing any problems since coming home? None  Are there any questions regarding any aspect of care? None.  2. Are there any questions regarding medications administration/dosing? None  Are meds being taken as prescribed?Yes.  Patient should review meds with caller to confirm. All medications confirmed. Have there been any falls? No falls.  3. Has Home Health been to the house and/or have they contacted you? No  If not, have you tried to contact them? [facility] ph number given (ph# [phone]). Can we help you contact them? Daughter will call [facility].  4. Are bowels and bladder emptying properly? Yes. Are there any unexpected incontinence issues? No.  If applicable, is patient following bowel/bladder programs? No issues.  5. Any fevers, problems with breathing, unexpected pain? No.  6. Are there any skin problems or new areas of breakdown? None. 7. Has the patient/family member arranged specialty MD follow up (ie cardiology/neurology/renal/surgical/etc)?  Appointments with Dr. Allena Katz & [facility] already scheduled.  Can we help arrange? No need.  8. Does the patient need any other services or support that we can help arrange? No. 9. Are caregivers following through as expected in assisting the patient? Yes.         11. Has the patient quit smoking, drinking alcohol, or using drugs as recommended? Patient does not take part in such activities.   Appointment Date/Time/ Arrival time/ and who they are seeing [address]

## 2020-06-08 ENCOUNTER — Encounter: Payer: Self-pay | Admitting: Physical Medicine & Rehabilitation

## 2020-06-08 ENCOUNTER — Other Ambulatory Visit: Payer: Self-pay

## 2020-06-08 ENCOUNTER — Encounter: Payer: Medicare HMO | Attending: Physical Medicine & Rehabilitation | Admitting: Physical Medicine & Rehabilitation

## 2020-06-08 VITALS — BP 157/93 | HR 94 | Temp 97.7°F | Ht 73.0 in | Wt 174.0 lb

## 2020-06-08 DIAGNOSIS — R269 Unspecified abnormalities of gait and mobility: Secondary | ICD-10-CM | POA: Insufficient documentation

## 2020-06-08 DIAGNOSIS — I10 Essential (primary) hypertension: Secondary | ICD-10-CM | POA: Diagnosis not present

## 2020-06-08 DIAGNOSIS — I639 Cerebral infarction, unspecified: Secondary | ICD-10-CM | POA: Diagnosis not present

## 2020-06-08 DIAGNOSIS — I82442 Acute embolism and thrombosis of left tibial vein: Secondary | ICD-10-CM | POA: Insufficient documentation

## 2020-06-08 DIAGNOSIS — M25561 Pain in right knee: Secondary | ICD-10-CM | POA: Insufficient documentation

## 2020-06-08 DIAGNOSIS — G8929 Other chronic pain: Secondary | ICD-10-CM | POA: Diagnosis present

## 2020-06-08 NOTE — Progress Notes (Addendum)
Subjective:    Patient ID: Tyler Glass, male    DOB: 1934/05/15, 86 y.o.   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  Admit date: 05/01/2020 Discharge date: 05/25/2020  At discharge, he was instructed to follow up with Neurology, with whom he has an appointment. Limited historian. He continues to take Eliquis for DVT. Knee pain is controlled. No issues with swallowing.  BP is elevated, states it runs that high at home as well. BMs are fairly regular.  Therapies: To start in 2 weeks DME: Did not require Mobility: Walker at all times  Pain Inventory Average Pain 0 Pain Right Now 0 My pain is no pain  LOCATION OF PAIN  No pain  BOWEL Number of stools per week: 5 Oral laxative use No  Type of laxative n/a Enema or suppository use No  History of colostomy No  Incontinent No   BLADDER Normal In and out cath, frequency n/a Able to self cath n/a Bladder incontinence No  Frequent urination No  Leakage with coughing No  Difficulty starting stream No  Incomplete bladder emptying No    Mobility walk with assistance use a walker  Function retired  Neuro/Psych trouble walking  Prior Studies transions of care  Physicians involved in your care transions of care   History reviewed. No pertinent family history. Social History   Socioeconomic History  . Marital status: Married    Spouse name: Not on file  . Number of children: Not on file  . Years of education: Not on file  . Highest education level: Not on file  Occupational History  . Not on file  Tobacco Use  . Smoking status: Never Smoker  . Smokeless tobacco: Never Used  Vaping Use  . Vaping Use: Never used  Substance and Sexual Activity  . Alcohol use: Yes    Comment: occ  . Drug use: Not on file  . Sexual activity: Not on file  Other Topics Concern  . Not on file  Social History Narrative  . Not on file    Social Determinants of Health   Financial Resource Strain: Not on file  Food Insecurity: Not on file  Transportation Needs: Not on file  Physical Activity: Not on file  Stress: Not on file  Social Connections: Not on file   History reviewed. No pertinent surgical history. Past Medical History:  Diagnosis Date  . Hypertension    BP (!) 157/93   Pulse 94   Temp 97.7 F (36.5 C)   Ht 6\' 1"  (1.854 m)   Wt 174 lb (78.9 kg)   SpO2 97%   BMI 22.96 kg/m   Opioid Risk Score:   Fall Risk Score:  `1  Depression screen PHQ 2/9  Depression screen PHQ 2/9 06/08/2020  Decreased Interest 0  Down, Depressed, Hopeless 0  PHQ - 2 Score 0  Altered sleeping 0  Tired, decreased energy 0  Change in appetite 0  Feeling bad or failure about yourself  0  Trouble concentrating 0  Moving slowly or fidgety/restless 0  Suicidal thoughts 0  PHQ-9 Score 0    Review of Systems  Constitutional: Negative.   HENT: Negative.   Eyes: Negative.   Respiratory: Negative.   Cardiovascular: Negative.   Gastrointestinal: Negative.   Endocrine: Negative.   Genitourinary: Negative.   Musculoskeletal: Positive for gait problem.  Skin: Negative.   Allergic/Immunologic: Negative.   Neurological: Positive for weakness.  Hematological: Negative.   Psychiatric/Behavioral: Negative.   All other systems reviewed and are negative.      Objective:   Physical Exam  Constitutional: No distress . Vital signs reviewed. HENT: Normocephalic.  Atraumatic. Eyes: EOMI. No discharge. Cardiovascular: No JVD.   Respiratory: Normal effort.  No stridor.   GI: Non-distended.   Skin: Warm and dry.  Intact. Psych: Normal mood.  Normal behavior. Musc: No edema in extremities.  No tenderness in extremities. Neuro: Alert HOH Mild dysarthria, stable Motor: 5/5 throughout, unchanged Mild facial weakness    Assessment & Plan:  Right-handed male with history of hypertension present for follow up for right  lateral medullary infarct.  1.  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 Post stroke dysphagia.    Regular thins with intermittent supervision to initiate therapies to advance diet  4. Essential hypertension.    Not on any meds at present - may need to restart  Elevated today, pt states ?elevated at home as well.  Recommended ambulatory monitoring with purchase of home blood pressure cuff  5.  Right knee pain             Likely OA             Right knee sleeve/brace for support prn  6. Gait abnormality  Cont walker for safety  Discussed with daugher via telephone recommendations, she states she has a BP cuff at home and will schedule a PCP appointment.   Meds reviewed Referrals reviewed All questions answered

## 2020-06-13 ENCOUNTER — Other Ambulatory Visit: Payer: Self-pay

## 2020-06-13 ENCOUNTER — Emergency Department (HOSPITAL_BASED_OUTPATIENT_CLINIC_OR_DEPARTMENT_OTHER): Payer: Medicare HMO

## 2020-06-13 ENCOUNTER — Emergency Department (HOSPITAL_BASED_OUTPATIENT_CLINIC_OR_DEPARTMENT_OTHER)
Admission: EM | Admit: 2020-06-13 | Discharge: 2020-06-13 | Disposition: A | Discharge status: Home / Self Care | Payer: Medicare HMO | Attending: Emergency Medicine | Admitting: Emergency Medicine

## 2020-06-13 ENCOUNTER — Encounter (HOSPITAL_BASED_OUTPATIENT_CLINIC_OR_DEPARTMENT_OTHER): Payer: Self-pay | Admitting: Emergency Medicine

## 2020-06-13 DIAGNOSIS — N1831 Chronic kidney disease, stage 3a: Secondary | ICD-10-CM | POA: Diagnosis not present

## 2020-06-13 DIAGNOSIS — R1110 Vomiting, unspecified: Secondary | ICD-10-CM | POA: Insufficient documentation

## 2020-06-13 DIAGNOSIS — I82442 Acute embolism and thrombosis of left tibial vein: Secondary | ICD-10-CM | POA: Insufficient documentation

## 2020-06-13 DIAGNOSIS — R10815 Periumbilic abdominal tenderness: Secondary | ICD-10-CM | POA: Insufficient documentation

## 2020-06-13 DIAGNOSIS — Z7901 Long term (current) use of anticoagulants: Secondary | ICD-10-CM | POA: Insufficient documentation

## 2020-06-13 DIAGNOSIS — I129 Hypertensive chronic kidney disease with stage 1 through stage 4 chronic kidney disease, or unspecified chronic kidney disease: Secondary | ICD-10-CM | POA: Insufficient documentation

## 2020-06-13 DIAGNOSIS — R109 Unspecified abdominal pain: Secondary | ICD-10-CM | POA: Diagnosis not present

## 2020-06-13 DIAGNOSIS — I700 Atherosclerosis of aorta: Secondary | ICD-10-CM | POA: Insufficient documentation

## 2020-06-13 DIAGNOSIS — R000 Tachycardia, unspecified: Secondary | ICD-10-CM | POA: Insufficient documentation

## 2020-06-13 DIAGNOSIS — R066 Hiccough: Secondary | ICD-10-CM | POA: Diagnosis not present

## 2020-06-13 DIAGNOSIS — K529 Noninfective gastroenteritis and colitis, unspecified: Secondary | ICD-10-CM

## 2020-06-13 LAB — COMPREHENSIVE METABOLIC PANEL
ALT: 32 U/L (ref 0–44)
AST: 36 U/L (ref 15–41)
Albumin: 4.2 g/dL (ref 3.5–5.0)
Alkaline Phosphatase: 91 U/L (ref 38–126)
Anion gap: 11 (ref 5–15)
BUN: 15 mg/dL (ref 8–23)
CO2: 27 mmol/L (ref 22–32)
Calcium: 9.5 mg/dL (ref 8.9–10.3)
Chloride: 100 mmol/L (ref 98–111)
Creatinine, Ser: 1.41 mg/dL — ABNORMAL HIGH (ref 0.61–1.24)
GFR, Estimated: 49 mL/min — ABNORMAL LOW (ref >60)
Glucose, Bld: 144 mg/dL — ABNORMAL HIGH (ref 70–99)
Potassium: 3.7 mmol/L (ref 3.5–5.1)
Sodium: 138 mmol/L (ref 135–145)
Total Bilirubin: 0.7 mg/dL (ref 0.3–1.2)
Total Protein: 7.4 g/dL (ref 6.5–8.1)

## 2020-06-13 LAB — URINALYSIS, ROUTINE W REFLEX MICROSCOPIC
Bilirubin Urine: NEGATIVE
Glucose, UA: NEGATIVE mg/dL
Hgb urine dipstick: NEGATIVE
Ketones, ur: NEGATIVE mg/dL
Nitrite: NEGATIVE
Protein, ur: NEGATIVE mg/dL
Specific Gravity, Urine: 1.005 (ref 1.005–1.030)
pH: 6.5 (ref 5.0–8.0)

## 2020-06-13 LAB — CBC
HCT: 44.9 % (ref 39.0–52.0)
Hemoglobin: 14.6 g/dL (ref 13.0–17.0)
MCH: 28.7 pg (ref 26.0–34.0)
MCHC: 32.5 g/dL (ref 30.0–36.0)
MCV: 88.4 fL (ref 80.0–100.0)
Platelets: 349 10*3/uL (ref 150–400)
RBC: 5.08 MIL/uL (ref 4.22–5.81)
RDW: 12.8 % (ref 11.5–15.5)
WBC: 6.7 10*3/uL (ref 4.0–10.5)
nRBC: 0 % (ref 0.0–0.2)

## 2020-06-13 LAB — URINALYSIS, MICROSCOPIC (REFLEX)

## 2020-06-13 LAB — LIPASE, BLOOD: Lipase: 29 U/L (ref 11–51)

## 2020-06-13 MED ORDER — CIPROFLOXACIN HCL 500 MG PO TABS: 500.0000 mg | ORAL_TABLET | Freq: Once | ORAL | Status: DC

## 2020-06-13 MED ORDER — DIPHENHYDRAMINE HCL 50 MG/ML IJ SOLN
25.0000 mg | Freq: Once | INTRAMUSCULAR | Status: AC
  Administered 2020-06-13: 20:00:00 25 mg via INTRAVENOUS
  Filled 2020-06-13: qty 1

## 2020-06-13 MED ORDER — LORAZEPAM 2 MG/ML IJ SOLN
1.0000 mg | Freq: Once | INTRAMUSCULAR | Status: AC
  Administered 2020-06-13: 21:00:00 1 mg via INTRAVENOUS
  Filled 2020-06-13: qty 1

## 2020-06-13 MED ORDER — METOCLOPRAMIDE HCL 10 MG PO TABS: 10.0000 mg | ORAL_TABLET | Freq: Four times a day (QID) | ORAL | 0 refills | Status: DC | PRN

## 2020-06-13 MED ORDER — METRONIDAZOLE 500 MG PO TABS: 500.0000 mg | ORAL_TABLET | Freq: Once | ORAL | Status: DC

## 2020-06-13 MED ORDER — ONDANSETRON HCL 4 MG/2ML IJ SOLN: 4.0000 mg | Freq: Once | INTRAMUSCULAR | Status: DC

## 2020-06-13 MED ORDER — SODIUM CHLORIDE 0.9 % IV BOLUS
1000.0000 mL | Freq: Once | INTRAVENOUS | Status: AC
  Administered 2020-06-13: 20:00:00 1000 mL via INTRAVENOUS

## 2020-06-13 MED ORDER — METOCLOPRAMIDE HCL 5 MG/ML IJ SOLN
10.0000 mg | Freq: Once | INTRAMUSCULAR | Status: AC
  Administered 2020-06-13: 20:00:00 10 mg via INTRAVENOUS
  Filled 2020-06-13: qty 2

## 2020-06-13 MED ORDER — IOHEXOL 300 MG/ML  SOLN
100.0000 mL | Freq: Once | INTRAMUSCULAR | Status: AC | PRN
  Administered 2020-06-13: 20:00:00 100 mL via INTRAVENOUS

## 2020-06-13 NOTE — ED Triage Notes (Signed)
Reports central abdominal pain with n/v/d since Wednesday.  Recently hospitalized with a stroke at [facility].

## 2020-06-13 NOTE — ED Notes (Signed)
Discharge instructions discussed. Prescriptions reviewed. Departs ED at this time with daughter in stable condition.

## 2020-06-13 NOTE — ED Provider Notes (Signed)
[facility] EMERGENCY DEPARTMENT Provider Note   CSN: [id number] Arrival date & time: 06/13/20  1756     History Chief Complaint  Patient presents with  . Abdominal Pain  . Vomiting    Tyler HughsJessie Shelburne is a 86 y.o. male history of hypertension, recent brainstem stroke and DVT on Eliquis, here presenting with abdominal pain and vomiting and hiccups.  Patient has been having hiccups for the last week or so.  Patient apparently went to the hospital and was given medicines and felt better.  Since yesterday he has been having vomiting.  He states that he cannot keep anything down and has some hiccups as well.  Denies any chest pain.  Patient denies any focal weakness.   The history is provided by the patient.       Past Medical History:  Diagnosis Date  . Hypertension     Patient Active Problem List   Diagnosis Date Noted  . Abnormality of gait 06/08/2020  . Chronic pain of right knee   . Strain of left iliopsoas muscle   . Stage 3 chronic kidney disease (HCC)   . Acute deep vein thrombosis (DVT) of tibial vein of left lower extremity (HCC)   . AKI (acute kidney injury) (HCC)   . Hiccups   . Acute blood loss anemia   . Hypoalbuminemia due to protein-calorie malnutrition (HCC)   . Hypokalemia   . Essential hypertension   . Transaminitis   . Dysphagia, post-stroke   . Brainstem stroke (HCC)   . Benign essential HTN   . Other hyperlipidemia   . Vitamin B12 deficiency   . Hypertension   . Chronic renal failure, stage 3a (HCC)   . CVA (cerebral vascular accident) (HCC) 04/27/2020    History reviewed. No pertinent surgical history.     No family history on file.  Social History   Tobacco Use  . Smoking status: Never Smoker  . Smokeless tobacco: Never Used  Vaping Use  . Vaping Use: Never used  Substance Use Topics  . Alcohol use: Not Currently    Comment: occ  . Drug use: Never    Home Medications Prior to Admission medications   Medication Sig  Start Date End Date Taking? Authorizing Provider  acetaminophen (TYLENOL) 325 MG tablet Take 2 tablets (650 mg total) by mouth every 4 (four) hours as needed for mild pain (or temp > 37.5 C (99.5 F)). 05/25/20   Angiulli, Mcarthur Rossettianiel J, PA-C  apixaban (ELIQUIS) 5 MG TABS tablet Take 1 tablet (5 mg total) by mouth 2 (two) times daily. 05/25/20   Angiulli, Mcarthur Rossettianiel J, PA-C  atorvastatin (LIPITOR) 80 MG tablet Take 1 tablet (80 mg total) by mouth daily. 05/25/20   Angiulli, Mcarthur Rossettianiel J, PA-C  diclofenac Sodium (VOLTAREN) 1 % GEL Apply 2 g topically 4 (four) times daily as needed (pain). 05/25/20   Angiulli, Mcarthur Rossettianiel J, PA-C  methocarbamol (ROBAXIN) 500 MG tablet Take 1 tablet (500 mg total) by mouth every 8 (eight) hours as needed for muscle spasms. 05/25/20   Angiulli, Mcarthur Rossettianiel J, PA-C  pantoprazole (PROTONIX) 40 MG tablet Take 1 tablet (40 mg total) by mouth daily. 05/25/20   Angiulli, Mcarthur Rossettianiel J, PA-C  polyethylene glycol (MIRALAX / GLYCOLAX) 17 g packet Take 17 g by mouth 2 (two) times daily. 05/25/20   Angiulli, Mcarthur Rossettianiel J, PA-C    Allergies    Patient has no known allergies.  Review of Systems   Review of Systems  Gastrointestinal: Positive for abdominal pain  and vomiting.  All other systems reviewed and are negative.   Physical Exam Updated Vital Signs BP (!) 141/96   Pulse 89   Temp 98.2 F (36.8 C) (Oral)   Resp 16   Ht 6\' 1"  (1.854 m)   Wt 79.4 kg   SpO2 100%   BMI 23.09 kg/m   Physical Exam Vitals and nursing note reviewed.  Constitutional:      Comments: Chronically ill, hiccups  HENT:     Head: Normocephalic.  Eyes:     Extraocular Movements: Extraocular movements intact.     Pupils: Pupils are equal, round, and reactive to light.  Cardiovascular:     Rate and Rhythm: Normal rate and regular rhythm.  Pulmonary:     Effort: Pulmonary effort is normal.  Abdominal:     General: Abdomen is flat.     Comments: Mild periumbilical tenderness.  Skin:    General: Skin is warm.     Capillary  Refill: Capillary refill takes less than 2 seconds.  Neurological:     General: No focal deficit present.     Mental Status: He is oriented to person, place, and time.  Psychiatric:        Mood and Affect: Mood normal.        Behavior: Behavior normal.     ED Results / Procedures / Treatments   Labs (all labs ordered are listed, but only abnormal results are displayed) Labs Reviewed  COMPREHENSIVE METABOLIC PANEL - Abnormal; Notable for the following components:      Result Value   Glucose, Bld 144 (*)    Creatinine, Ser 1.41 (*)    GFR, Estimated 49 (*)    All other components within normal limits  URINALYSIS, ROUTINE W REFLEX MICROSCOPIC - Abnormal; Notable for the following components:   APPearance HAZY (*)    Leukocytes,Ua MODERATE (*)    All other components within normal limits  URINALYSIS, MICROSCOPIC (REFLEX) - Abnormal; Notable for the following components:   Bacteria, UA MANY (*)    All other components within normal limits  URINE CULTURE  LIPASE, BLOOD  CBC    EKG EKG Interpretation  Date/Time:  Saturday June 13 2020 18:14:47 EST Ventricular Rate:  116 PR Interval:  142 QRS Duration: 78 QT Interval:  334 QTC Calculation: 464 R Axis:   47 Text Interpretation: Sinus tachycardia Nonspecific ST abnormality Abnormal ECG Since last tracing rate faster Confirmed by [id number] [phone]) on 06/13/2020 7:16:44 PM   Radiology CT ABDOMEN PELVIS W CONTRAST  Result Date: 06/13/2020 CLINICAL DATA:  Will distension. EXAM: CT ABDOMEN AND PELVIS WITH CONTRAST TECHNIQUE: Multidetector CT imaging of the abdomen and pelvis was performed using the standard protocol following bolus administration of intravenous contrast. CONTRAST:  06/15/2020 OMNIPAQUE IOHEXOL 300 MG/ML  SOLN COMPARISON:  None. FINDINGS: Lower chest: The lung bases are clear. The heart size is normal. Hepatobiliary: The liver is normal. Status post cholecystectomy.There is no biliary ductal dilation. Pancreas:  Normal contours without ductal dilatation. No peripancreatic fluid collection. Spleen: Unremarkable. Adrenals/Urinary Tract: --Adrenal glands: Unremarkable. --Right kidney/ureter: There are multiple low-attenuation areas in the cortex of the right kidney suggestive of small cysts. Additionally, there are areas of cortical thinning consistent with scarring. --Left kidney/ureter: Multiple cysts are noted involving the left kidney. There are areas of cortical thinning and scarring. --Urinary bladder: There is diffuse bladder wall thickening. Stomach/Bowel: --Stomach/Duodenum: There is a moderate-sized hiatal hernia. --Small bowel: Unremarkable. --Colon: There is scattered colonic diverticula without definite  evidence for diverticulitis. There is liquid stool throughout the colon. There are scattered areas of hyperenhancement of the colon for example at the rectum, sigmoid colon, and hepatic flexure. --Appendix: Normal. Vascular/Lymphatic: Atherosclerotic calcification is present within the non-aneurysmal abdominal aorta, without hemodynamically significant stenosis. There are questionable filling defects within the bilateral common femoral veins, left greater than right (axial series 2, image 78). --No retroperitoneal lymphadenopathy. --No mesenteric lymphadenopathy. --No pelvic or inguinal lymphadenopathy. Reproductive: There is significant prostatomegaly. Other: No ascites or free air. The abdominal wall is normal. Musculoskeletal. There are advanced multilevel degenerative changes throughout the lumbar spine. There is no evidence for an acute compression fracture. IMPRESSION: 1. Liquid stool throughout the colon consistent with diarrhea. There are scattered areas of hyperenhancement of the colon, which may be secondary to an infectious or inflammatory colitis. 2. Questionable filling defects within the bilateral common femoral veins, left greater than right. While this may be secondary to mixing artifact,  correlation with ultrasound is recommended. 3. Prostatomegaly with diffuse bladder wall thickening. Findings may be secondary to chronic outlet obstruction. 4. Moderate-sized hiatal hernia. Aortic Atherosclerosis (ICD10-I70.0). Electronically Signed   By: Katherine Mantle M.D.   On: 06/13/2020 20:06   US Venous Img Lower Unilateral Left (DVT)  Result Date: 06/13/2020 CLINICAL DATA:  86 year old male with left calf DVT last month. Questionable filling defects in bilateral common femoral veins on CT Abdomen and Pelvis tonight. EXAM: LEFT LOWER EXTREMITY VENOUS DOPPLER ULTRASOUND TECHNIQUE: Gray-scale sonography with compression, as well as color and duplex ultrasound, were performed to evaluate the deep venous system(s) from the level of the common femoral vein through the popliteal and proximal calf veins. COMPARISON:  CT Abdomen and Pelvis 1940 hours today. Pelvis MRI 05/11/2020. FINDINGS: VENOUS Normal compressibility of the common femoral, superficial femoral, and popliteal veins. Visualized portions of profunda femoral vein and great saphenous vein unremarkable. Evidence of patent left lower extremity deep venous system until the calf, where a single incompressible calf vein appears partially thrombosed (images 52 and 53). Furthermore, the distal superficial saphenous vein also appears incompressible and thrombosed (images 61 and 62). No other filling defects to suggest DVT on grayscale or color Doppler imaging. Doppler waveforms show normal direction of venous flow, normal respiratory plasticity and response to augmentation. Limited views of the contralateral common femoral vein are unremarkable. OTHER None. Limitations: none IMPRESSION: 1. Positive for partially thrombosed single left lower extremity calf vein, and nearby distal left saphenous superficial venous thrombosis. 2. Negative for other left lower extremity DVT. Bilateral common femoral veins are patent. Electronically Signed   By: Odessa Fleming M.D.    On: 06/13/2020 21:04    Procedures Procedures (including critical care time)  Medications Ordered in ED Medications  sodium chloride 0.9 % bolus 1,000 mL (0 mLs Intravenous Stopped 06/13/20 2102)  metoCLOPramide (REGLAN) injection 10 mg (10 mg Intravenous Given 06/13/20 1946)  diphenhydrAMINE (BENADRYL) injection 25 mg (25 mg Intravenous Given 06/13/20 1946)  iohexol (OMNIPAQUE) 300 MG/ML solution 100 mL (100 mLs Intravenous Contrast Given 06/13/20 1930)  LORazepam (ATIVAN) injection 1 mg (1 mg Intravenous Given 06/13/20 2108)    ED Course  I have reviewed the triage vital signs and the nursing notes.  Pertinent labs & imaging results that were available during my care of the patient were reviewed by me and considered in my medical decision making (see chart for details).    MDM Rules/Calculators/A&P  Sherri Levenhagen is a 86 y.o. male here presenting with hiccups and vomiting.  I think hiccups likely secondary to abdominal pain and vomiting.  Plan to get CBC, CMP, lipase, CT abdomen pelvis, UA to look for obstruction or pancreatitis.  Will hydrate and give antiemetics and reassess.  9:58 PM Patient's labs were baseline.  CT showed possible colitis.  Also possible filling defect of the common femoral veins.  Patient did have a DVT on the left side and ultrasound today showed left calf vein DVT.  He is already on Eliquis and the size of the DVT actually improved from previous.  Patient's urine showed some bacteria but no obvious signs of UTI.  Since patient has no dysuria, will hold off antibiotics and send off urine culture.  I think clinically he has viral gastroenteritis.  Will discharge home with Reglan which will help with nausea as well as hiccups.  Hiccups are resolved after given meds.  Final Clinical Impression(s) / ED Diagnoses Final diagnoses:  None    Rx / DC Orders ED Discharge Orders    None       Charlynne Pander, MD 06/13/20 2200

## 2020-06-13 NOTE — Discharge Instructions (Addendum)
Stay hydrated   Take reglan for nausea and vomiting and hiccups   You likely have viral gastroenteritis   See your doctor   Return to ER if you have worse abdominal pain, vomiting, fever, uncontrolled hiccups

## 2020-06-13 NOTE — ED Notes (Signed)
Patient transported to CT 

## 2020-06-13 NOTE — ED Notes (Signed)
Ultrasound at bedside

## 2020-06-15 ENCOUNTER — Telehealth: Payer: Self-pay

## 2020-06-15 NOTE — Telephone Encounter (Signed)
Tyler Glass, OT from [facility] called requesting HHOT 1wk8 for post stroke recovery, RUE strenghtening, ADL training. Orders approved and given.

## 2020-06-16 LAB — URINE CULTURE: Culture: >=100000 — AB

## 2020-06-17 ENCOUNTER — Telehealth: Payer: Self-pay

## 2020-06-17 NOTE — Telephone Encounter (Signed)
NO treatment for UC ED 06/13/20 per Rhea Bleacher PA

## 2020-06-22 ENCOUNTER — Telehealth: Payer: Self-pay

## 2020-06-22 NOTE — Telephone Encounter (Signed)
Per protocol: Ok given for Speech Therapy once a week for 8 week by [facility].

## 2020-06-30 ENCOUNTER — Ambulatory Visit: Payer: Medicare HMO | Admitting: Adult Health

## 2020-06-30 ENCOUNTER — Encounter: Payer: Self-pay | Admitting: Adult Health

## 2020-06-30 ENCOUNTER — Other Ambulatory Visit: Payer: Self-pay

## 2020-06-30 VITALS — BP 125/81 | HR 92 | Ht 73.0 in

## 2020-06-30 DIAGNOSIS — I639 Cerebral infarction, unspecified: Secondary | ICD-10-CM | POA: Diagnosis not present

## 2020-06-30 DIAGNOSIS — I10 Essential (primary) hypertension: Secondary | ICD-10-CM

## 2020-06-30 DIAGNOSIS — E785 Hyperlipidemia, unspecified: Secondary | ICD-10-CM

## 2020-06-30 DIAGNOSIS — I82442 Acute embolism and thrombosis of left tibial vein: Secondary | ICD-10-CM | POA: Diagnosis not present

## 2020-06-30 MED ORDER — CHLORPROMAZINE HCL 25 MG PO TABS: 12.5000 mg | ORAL_TABLET | Freq: Three times a day (TID) | ORAL | 3 refills | Status: DC | PRN

## 2020-06-30 NOTE — Patient Instructions (Signed)
Recommend continued participation with home health therapies - may consider doing additional outpatient therapies once completed   Trial use of Thorazine 25 mg 3 times daily as needed for hiccups  Continue Eliquis (apixaban) daily  and atorvastatin 80 mg daily for secondary stroke prevention  Continue to follow up with PCP regarding cholesterol and blood pressure management  Maintain strict control of hypertension with blood pressure goal below 130/90 and cholesterol with LDL cholesterol (bad cholesterol) goal below 70 mg/dL.       Followup in the future with me in 3 months or call earlier if needed       Thank you for coming to see [place] at [facility]. I hope we have been able to provide you high quality care today.  You may receive a patient satisfaction survey over the next few weeks. We would appreciate your feedback and comments so that we may continue to improve ourselves and the health of our patients.    Hiccups  A hiccup is the result of a sudden irritation of a muscle that is used for breathing (diaphragm). The diaphragm is located under your lungs and above your stomach. When the diaphragm gets irritated, it may quickly tighten without your control (have a spasm). The spasm causes you to quickly suck in air, and that causes your vocal cords to close together quickly. These reactions cause the hiccup sound. Hiccups usually last only a short amount of time (less than 48 hours). In unusual cases, they can last for days or months and require you to see your health care provider. Common causes of hiccups include:  Eating too fast or eating too much food.  Drinking alcohol or bubbly (carbonated) drinks.  Eating or drinking hot or spicy foods and drinks.  Swallowing extra air when sucking on candy or a straw or when chewing on gum.  Feeling nervous, stressed, or excited.  Having certain conditions that irritate the diaphragm nerves.  Having metabolic or  nervous system disorders. Follow these instructions at home: To prevent hiccups or lessen discomfort from hiccups:  Eat and chew your food slowly.  Eat small meals, and avoid overeating.  If you drink alcohol: ? Limit how much you have to:  0-1 drink a day for women who are not pregnant.  0-2 drinks a day for men. ? Know how much alcohol is in a drink. In the [place] one drink equals one 12 oz bottle of beer (355 mL), one 5 oz glass of wine (148 mL), or one 1 oz glass of hard liquor (44 mL).  Limit your drinking of carbonated or fizzy drinks, such as soda.  Avoid eating or drinking hot or spicy foods and drinks. General instructions  Watch for any changes in your hiccups.  Take over-the-counter and prescription medicines only as told by your health care provider. Contact a health care provider if:  Your hiccups last for more than 48 hours.  Your hiccups do not improve with treatment.  You cannot sleep or eat because of your hiccups.  You have unexpected weight loss because of your hiccups.  You have numbness, tingling, or weakness. Get help right away if:  You have trouble breathing or swallowing.  You have severe pain in your abdomen. These symptoms may represent a serious problem that is an emergency. Do not wait to see if the symptoms will go away. Get medical help right away. Call your local emergency services (911 in the [place] Do not drive yourself to the hospital.  Summary  A hiccup is the result of a sudden irritation of a muscle that is used for breathing (diaphragm).  Hiccups can be caused by many things, including eating too fast.  Call your health care provider if your hiccups last for more than 48 hours. This information is not intended to replace advice given to you by your health care provider. Make sure you discuss any questions you have with your health care provider. Document Revised: 02/07/2020 Document Reviewed: 02/07/2020 Elsevier Patient Education   2021 Elsevier Inc.     Chlorpromazine tablets What is this medicine? CHLORPROMAZINE (klor PROE ma zeen) has many different uses. It is used to treat certain mental and behavioral disorders. It is also used to control nausea and vomiting, nervousness before surgery, and hiccups that will not go away. It is also used to treat episodes of porphyria and in combination with other medicines to treat tetanus. This medicine may be used for other purposes; ask your health care provider or pharmacist if you have questions. COMMON BRAND NAME(S): Thorazine What should I tell my health care provider before I take this medicine? They need to know if you have any of these conditions:  blockage in your bowel  brain tumor  dementia  diabetes  difficulty swallowing  glaucoma  have trouble controlling your muscles  head injury  heart disease  history of irregular heartbeat  if you often drink alcohol  kidney disease  liver disease  low blood counts, like low white cell, platelet, or red cell counts  low blood pressure  lung or breathing disease, like asthma  Parkinson's disease  prostate disease  seizures  trouble passing urine  an unusual or allergic reaction to chlorpromazine, sulfites, other medicines, foods, dyes, or preservatives  pregnant or trying to get pregnant  breast-feeding How should I use this medicine? Take this medicine by mouth with a glass of water. Follow the directions on the prescription label. Take your doses at regular intervals. Do not take your medicine more often than directed. Do not stop taking this medicine suddenly. This can cause nausea, vomiting, and dizziness. Ask your doctor or health care professional for advice if you are to stop taking this medicine. Talk to your pediatrician regarding the use of this medicine in children. Special care may be needed. While this medicine may be prescribed for children as young as 6 months for selected  conditions, precautions do apply. Overdosage: If you think you have taken too much of this medicine contact a poison control center or emergency room at once. NOTE: This medicine is only for you. Do not share this medicine with others. What if I miss a dose? If you miss a dose, take it as soon as you can. If it is almost time for your next dose, take only that dose. Do not take double or extra doses. What may interact with this medicine? Do not take this medicine with any of the following medications:  cisapride  dronedarone  metoclopramide  pimozide  saquinavir  thioridazine This medicine may also interact with the following medications:  alcohol  antihistamines for allergy, cough, and cold  atropine  certain medicines for anxiety or sleep  certain medicines for bladder problems like oxybutynin, tolterodine  certain medicines for depression like amitriptyline, fluoxetine, sertraline  certain medicines for stomach problems like dicyclomine, hyoscyamine  certain medicines for travel sickness like scopolamine  epinephrine  general anesthetics like halothane, isoflurane, methoxyflurane, propofol  levodopa or other medicines for Parkinson's disease  lithium  medicines for high blood pressure  medicines for seizures like phenobarbital, primidone, phenytoin  medicines that relax muscles for surgery  narcotic medicines for pain  other medicines that prolong the QT interval (an abnormal heart rhythm)  propranolol  warfarin This list may not describe all possible interactions. Give your health care provider a list of all the medicines, herbs, non-prescription drugs, or dietary supplements you use. Also tell them if you smoke, drink alcohol, or use illegal drugs. Some items may interact with your medicine. What should I watch for while using this medicine? Visit your health care professional for regular checks on your progress. Tell your health care professional if  symptoms do not start to get better or if they get worse. Do not stop taking except on your health care professional's advice. You may develop a severe reaction. Your health care professional will tell you how much medicine to take. You may get drowsy, dizzy, or have blurred vision. Do not drive, use machinery, or do anything that needs mental alertness until you know how this medicine affects you. Do not stand or sit up quickly, especially if you are an older patient. This reduces the risk of dizzy or fainting spells. Alcohol can increase possible dizziness or drowsiness. Avoid alcoholic drinks. This drug can cause problems with controlling your body temperature. It can lower the response of your body to cold temperatures. If possible, stay indoors during cold weather. If you must go outdoors, wear warm clothes. It can also lower the response of your body to heat. Do not overheat. Do not over-exercise. Stay out of the sun when possible. If you must be in the sun, wear cool clothing. Drink plenty of water. If you have trouble controlling your body temperature, call your health care provider right away. This medicine can make you more sensitive to the sun. Keep out of the sun. If you cannot avoid being in the sun, wear protective clothing and use sunscreen. Do not use sun lamps or tanning beds/booths. This medicine may increase blood sugar. Ask your health care provider if changes in diet or medicines are needed if you have diabetes. Your mouth may get dry. Chewing sugarless gum or sucking hard candy, and drinking plenty of water may help. Contact your doctor if the problem does not go away or is severe. If you are going to have surgery, tell your doctor or health care professional that you are taking this medicine. What side effects may I notice from receiving this medicine? Side effects that you should report to your doctor or health care professional as soon as possible:  allergic reactions like skin  rash, itching or hives, swelling of the face, lips, or tongue  abnormal production of milk  breast enlargement in both males and females  changes in vision  chest pain  confusion  fast, irregular heartbeat  fever, chills, sore throat  seizures  signs and symptoms of high blood sugar such as being more thirsty or hungry or having to urinate more than normal. You may also feel very tired or have blurry vision.  signs and symptoms of liver injury like dark yellow or brown urine; general ill feeling or flu-like symptoms; light-colored stools; loss of appetite; nausea; right upper belly pain; unusually weak or tired; yellowing of the eyes or skin  signs and symptoms of low blood pressure like dizziness; feeling faint or lightheaded, falls; unusually weak or tired  trouble passing urine or change in the amount of urine  trouble swallowing  uncontrollable movements of the arms, face, head, mouth, neck, or upper body  unusual bruising or bleeding  unusually weak or tired Side effects that usually do not require medical attention (report to your doctor or health care professional if they continue or are bothersome):  constipation  drowsiness  dry mouth This list may not describe all possible side effects. Call your doctor for medical advice about side effects. You may report side effects to FDA at 1-800-FDA-1088. Where should I keep my medicine? Keep out of the reach of children. Store at room temperature between 15 and 30 degrees C (59 and 86 degrees F). Throw away any unused medicine after the expiration date. NOTE: This sheet is a summary. It may not cover all possible information. If you have questions about this medicine, talk to your doctor, pharmacist, or health care provider.  2021 Elsevier/Gold Standard (2019-04-30 11:43:15)

## 2020-06-30 NOTE — Progress Notes (Signed)
[facility] [address] [place]. [place] [id number] [phone]       HOSPITAL FOLLOW UP NOTE  [name] Date of Birth:  22-Jun-1933 Medical Record Number:  [id number]   Reason for Referral:  hospital stroke follow up    SUBJECTIVE:   CHIEF COMPLAINT:  Chief Complaint  Patient presents with  . Follow-up    Rm 14 with daughter Tyler Glass(Tyler Glass) Pt is well, daughter thinks he has had another stroke since Hospital visit.     HPI:   Tyler Glass is a 87 y.o. male with history of HTN  who presented on 04/27/2020 with persistent R sided weakness w/ gait instability, dysphagia and persistent hiccups.  Personally reviewed hospitalization pertinent progress notes, lab work and imaging with summary provided.  Evaluated by Dr. 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  Recommended DAPT for 3 months then Plavix alone as on aspirin PTA.  History of HTN stable during admission.  History of HLD on atorvastatin 40 mg daily with LDL 118 therefore increase to 80 mg daily.  Other stroke risk factors include advanced age and EtOH use but no prior stroke history.  Residual deficits of mild right-sided dysmetria, dysphagia and hiccups (placed on Thorazine 12.5 mg 3 times daily).  Evaluated by therapies and discharged to CIR per recommendations for ongoing therapy needs.    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    CT head No acute abnormality.   MRI  R lateral medullary infarct. Mild small vessel disease.   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 Dominant L VA 30-50% stenosis at origin. R VA severe stenosis w/ no flow beyond.  2D Echo EF 60-65%. No source of embolus   LDL 118  HgbA1c 6.0  VTE prophylaxis - Lovenox 40 mg sq daily   aspirin 81 mg daily prior to admission per pt, now on aspirin 325 mg  daily. Will decrease aspirin to 81 and add plavix. Continue DAPT x 3 months then plavix alone    Therapy recommendations:  CIR  Disposition: CIR  Today, 06/30/2020, [name] is being seen for hospital follow-up accompanied by his daughter.  He was discharged home from CIR on 05/25/2020 after 24-day stay.  Reports residual gait impairment, dysarthria, dysphagia and intermittent hiccups. Per daughter, hiccups subsided but restarted on 12/21 and on 12/23 [facility] on elevated blood pressure and concern regarding recurrence of hiccups therefore EMS called but patient refused to go to ED. he continued to experience hiccups in addition to nausea and vomiting therefore further evaluated in the ED on 12/25.  Diagnosed with likely viral gastroenteritis and discharged on Reglan for possible benefit on nausea and hiccups with resolution of hiccups after medications.  Daughter reports intermittent hiccups typically after swallowing pills or eating and can be accompanied by vomiting.  He continues to live with his daughter but is eager to return back to his home as he was previously independent in ADLs and IADLs.  He is currently ambulating with rolling walker and [facility]. During CIR admission, he was found to have a posterior tibial, peroneal and gastrocnemius vein LLE DVT therefore transition DAPT to Eliquis.  He has remained on Eliquis without bleeding or bruising.  Remains on atorvastatin 80 mg daily without myalgias.  Blood pressure today 125/81.  Stable at home recently but  daughter reports initially upon stroke admission discharge, lisinopril was held but since restarting, blood pressure has stabilized.  No further concerns at this time.      ROS:   14 system review of systems performed and negative with exception of those listed in HPI  PMH:  Past Medical History:  Diagnosis Date  . Hypertension     PSH: History reviewed. No pertinent surgical history.  Social History:   Social History   Socioeconomic History  . Marital status: Married    Spouse name: Not on file  . Number of children: Not on file  . Years of education: Not on file  . Highest education level: Not on file  Occupational History  . Not on file  Tobacco Use  . Smoking status: Never Smoker  . Smokeless tobacco: Never Used  Vaping Use  . Vaping Use: Never used  Substance and Sexual Activity  . Alcohol use: Not Currently    Comment: occ  . Drug use: Never  . Sexual activity: Not on file  Other Topics Concern  . Not on file  Social History Narrative  . Not on file   Social Determinants of Health   Financial Resource Strain: Not on file  Food Insecurity: Not on file  Transportation Needs: Not on file  Physical Activity: Not on file  Stress: Not on file  Social Connections: Not on file  Intimate Partner Violence: Not on file    Family History: History reviewed. No pertinent family history.  Medications:   Current Outpatient Medications on File Prior to Visit  Medication Sig Dispense Refill  . acetaminophen (TYLENOL) 325 MG tablet Take 2 tablets (650 mg total) by mouth every 4 (four) hours as needed for mild pain (or temp > 37.5 C (99.5 F)).    . diclofenac Sodium (VOLTAREN) 1 % GEL Apply 2 g topically 4 (four) times daily as needed (pain). 2 g 0  . methocarbamol (ROBAXIN) 500 MG tablet Take 1 tablet (500 mg total) by mouth every 8 (eight) hours as needed for muscle spasms. 60 tablet 0  . metoCLOPramide (REGLAN) 10 MG tablet Take 1 tablet (10 mg total) by mouth every 6 (six) hours as needed for nausea (nausea/headache). 10 tablet 0  . apixaban (ELIQUIS) 5 MG TABS tablet Take 1 tablet (5 mg total) by mouth 2 (two) times daily. (Patient not taking: Reported on 06/30/2020) 60 tablet 1  . atorvastatin (LIPITOR) 80 MG tablet Take 1 tablet (80 mg total) by mouth daily. (Patient not taking: Reported on 06/30/2020) 30 tablet 0  . pantoprazole (PROTONIX) 40 MG tablet Take 1 tablet (40 mg  total) by mouth daily. (Patient not taking: Reported on 06/30/2020) 30 tablet 0  . polyethylene glycol (MIRALAX / GLYCOLAX) 17 g packet Take 17 g by mouth 2 (two) times daily. (Patient not taking: Reported on 06/30/2020) 14 each 0   No current facility-administered medications on file prior to visit.    Allergies:  No Known Allergies    OBJECTIVE:  Physical Exam  Vitals:   06/30/20 1357  BP: 125/81  Pulse: 92  Height: 6\' 1"  (1.854 m)   Body mass index is 23.09 kg/m. No exam data present  General: well developed, well nourished,  pleasant elderly African-American male, seated, in no evident distress Head: head normocephalic and atraumatic.   Neck: supple with no carotid or supraclavicular bruits Cardiovascular: regular rate and rhythm, no murmurs Musculoskeletal: no deformity Skin:  no rash/petichiae Vascular:  Normal pulses all extremities  Neurologic Exam Mental Status: Awake and fully alert.   Mild dysarthria and voice hoarseness.  Oriented to place and time. Recent and remote memory intact. Attention span, concentration and fund of knowledge appropriate. Mood and affect appropriate.  Cranial Nerves: Fundoscopic exam reveals sharp disc margins. Pupils equal, briskly reactive to light. Extraocular movements full without nystagmus. Visual fields full to confrontation.  HOH bilaterally. Facial sensation intact.  Mild left lower facial weakness.  Tongue, palate moves normally and symmetrically.  Motor: Normal bulk and tone. Normal strength in all tested extremity muscles -unable to appreciate weakness on exam Sensory.: intact to touch , pinprick , position and vibratory sensation.  Coordination: Rapid alternating movements normal in all extremities. Finger-to-nose and heel-to-shin performed accurately bilaterally. Gait and Station: Deferred as rolling walker not present at visit Reflexes: 1+ and symmetric. Toes downgoing.     NIHSS  2 Modified Rankin  3      ASSESSMENT:  Tyler Glass is a 87 y.o. 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  Found to have LLE DVT during CIR admission and placed on Eliquis.  Vascular risk factors include bilateral VA stenosis, HTN, HLD, advanced age and EtOH use.      PLAN:  1. Right lateral medullary stroke:  a. Residual deficit: Dysarthria, occasional dysarthria, gait impairment and intermittent hiccups.  i. Hiccups initially resolved but restarted approximately 2 weeks postdischarge.  Daughter concerned regarding possible recurrent stroke due to recurrence of hiccups.  Low suspicion of new stroke and likely more in setting of viral gastroenteritis and increased stressors.  As he has no new or worsening focal deficits currently on exam, will hold off on any additional imaging as current treatment plan would not alter.  Discussed importance of proceeding to ED immediately with any new stroke/TIA symptoms ii. Recommend Thorazine 12.5 mg 3 times daily as needed for hiccups.  May consider increasing dosage if needed but will start at low-dose due to age and importance of avoiding hypotension iii. Continue to work with [facility] PT/OT/SLP for hopeful ongoing improvement.  May consider transition to outpatient therapies once completed if indicated b. Continue Eliquis (apixaban) daily  and atorvastatin 80 mg daily for secondary stroke prevention.  Continue DAPT for total of 3 months duration then Plavix alone.  Discussed secondary stroke prevention measures and importance of close PCP follow up for aggressive stroke risk factor management  2. LLE DVT: Posterior tibial peroneal and gastrocnemius during CIR admission.  Remains on Eliquis and recommend continuation for at least 78-month duration and then will repeat lower extremity Doppler.  If resolved, may transition to aspirin  81 mg daily for secondary stroke prevention 3. HTN: BP goal <130/90.  Stable on lisinopril 40 mg daily per PCP 4. HLD: LDL goal <70. Recent LDL 118 on atorvastatin 40 mg daily therefore increased to 80 mg daily during recent stroke admission.  Request ongoing prescribing per PCP as well as repeat lipid panel in the next 1 to 2 months     Follow up in 3 months or call earlier if needed   CC:  [facility] provider: Dr. Wendie Simmer, Paula Compton, MD    I spent 60 minutes of face-to-face and non-face-to-face time with patient and daughter.  This included previsit chart review including recent hospitalization pertinent progress notes, lab work and imaging, lab review, study review, order entry, electronic health record documentation,  patient and daughter education and discussion regarding recent stroke including etiology, residual deficits including fluctuation and possible treatment of hiccups, importance of managing stroke risk factors and answered all questions to patient and daughters satisfaction     Ihor Austin, AGNP-BC  [facility] [address] [place], [place] [address]  Phone [phone] Fax [phone] Note: This document was prepared with digital dictation and possible smart phrase technology. Any transcriptional errors that result from this process are unintentional.

## 2020-07-01 NOTE — Progress Notes (Signed)
I agree with the above plan 

## 2020-07-07 ENCOUNTER — Telehealth: Payer: Self-pay | Admitting: *Deleted

## 2020-07-07 NOTE — Telephone Encounter (Signed)
Gala Romney OT requested a VO for MSW to visit Mr Ginsberg to assist with community resources.  Approval given.

## 2020-07-28 ENCOUNTER — Telehealth: Payer: Self-pay | Admitting: *Deleted

## 2020-07-28 ENCOUNTER — Telehealth: Payer: Self-pay

## 2020-07-28 NOTE — Telephone Encounter (Signed)
Victorino Dike, PT from [facility] calling requesting HHPT for 1wk6 start next week. Orders approved and given.

## 2020-07-28 NOTE — Telephone Encounter (Signed)
Talina OT [email or web address]  called to request extension on OT for Mr Nawabi.  She is asking to continue with visits 1wk8.Approval given.  He has an appt with Dr Allena Katz on 08/03/20.

## 2020-08-03 ENCOUNTER — Other Ambulatory Visit: Payer: Self-pay

## 2020-08-03 ENCOUNTER — Encounter: Payer: Medicare HMO | Attending: Physical Medicine & Rehabilitation | Admitting: Physical Medicine & Rehabilitation

## 2020-08-03 ENCOUNTER — Encounter: Payer: Self-pay | Admitting: Physical Medicine & Rehabilitation

## 2020-08-03 VITALS — BP 142/88 | HR 77 | Temp 98.5°F | Ht 73.0 in | Wt 174.0 lb

## 2020-08-03 DIAGNOSIS — I82442 Acute embolism and thrombosis of left tibial vein: Secondary | ICD-10-CM | POA: Diagnosis present

## 2020-08-03 DIAGNOSIS — I10 Essential (primary) hypertension: Secondary | ICD-10-CM | POA: Diagnosis present

## 2020-08-03 DIAGNOSIS — G8929 Other chronic pain: Secondary | ICD-10-CM | POA: Insufficient documentation

## 2020-08-03 DIAGNOSIS — M25561 Pain in right knee: Secondary | ICD-10-CM | POA: Insufficient documentation

## 2020-08-03 DIAGNOSIS — I639 Cerebral infarction, unspecified: Secondary | ICD-10-CM | POA: Insufficient documentation

## 2020-08-03 DIAGNOSIS — R269 Unspecified abnormalities of gait and mobility: Secondary | ICD-10-CM | POA: Insufficient documentation

## 2020-08-03 NOTE — Progress Notes (Signed)
Subjective:    Patient ID: Tyler Glass, male    DOB: 09/07/1933, 87 y.o.   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  Last clinic visit on 06/08/2020.  Family supplements history. Since that time, patient is still in therapies. He continues to follow up with Neuro.  He is still on Eliquis.  Denies swallowing issues. BP is relatively controlled. Discussed driving with daughter. Denies knee pain. Denies falls. Continues to be in therapies.   Pain Inventory Average Pain 0 Pain Right Now 0 My pain is no pain  LOCATION OF PAIN  No pain  BOWEL Number of stools per week: 5 Oral laxative use No  Type of laxative n/a Enema or suppository use No  History of colostomy No  Incontinent No   BLADDER Normal In and out cath, frequency n/a Able to self cath n/a Bladder incontinence No  Frequent urination No  Leakage with coughing No  Difficulty starting stream No  Incomplete bladder emptying No    Mobility walk with assistance use a walker  Function retired  Neuro/Psych trouble walking  Prior Studies Any changes since last visit?  no  Physicians involved in your care Any changes since last visit?  no   No family history on file. Social History   Socioeconomic History  . Marital status: Married    Spouse name: Not on file  . Number of children: Not on file  . Years of education: Not on file  . Highest education level: Not on file  Occupational History  . Not on file  Tobacco Use  . Smoking status: Never Smoker  . Smokeless tobacco: Never Used  Vaping Use  . Vaping Use: Never used  Substance and Sexual Activity  . Alcohol use: Not Currently    Comment: occ  . Drug use: Never  . Sexual activity: Not on file  Other Topics Concern  . Not on file  Social History Narrative  . Not on file   Social Determinants of Health   Financial Resource Strain: Not on file  Food  Insecurity: Not on file  Transportation Needs: Not on file  Physical Activity: Not on file  Stress: Not on file  Social Connections: Not on file   No past surgical history on file. Past Medical History:  Diagnosis Date  . Hypertension    BP (!) 142/88   Pulse 77   Temp 98.5 F (36.9 C)   Ht 6\' 1"  (1.854 m)   Wt 174 lb (78.9 kg)   SpO2 99%   BMI 22.96 kg/m   Opioid Risk Score:   Fall Risk Score:  `1  Depression screen PHQ 2/9  Depression screen PHQ 2/9 06/08/2020  Decreased Interest 0  Down, Depressed, Hopeless 0  PHQ - 2 Score 0  Altered sleeping 0  Tired, decreased energy 0  Change in appetite 0  Feeling bad or failure about yourself  0  Trouble concentrating 0  Moving slowly or fidgety/restless 0  Suicidal thoughts 0  PHQ-9 Score 0    Review of Systems  Constitutional: Negative.   HENT: Negative.   Eyes: Negative.   Respiratory: Negative.   Cardiovascular: Negative.   Gastrointestinal: Negative.   Endocrine: Negative.   Genitourinary: Negative.   Musculoskeletal: Positive for gait problem.  Skin: Negative.   Allergic/Immunologic: Negative.   Neurological: Positive for weakness.  Hematological: Negative.   Psychiatric/Behavioral: Negative.   All other systems reviewed and  are negative.     Objective:   Physical Exam  Constitutional: No distress . Vital signs reviewed. HENT: Normocephalic.  Atraumatic. Eyes: EOMI. No discharge. Cardiovascular: No JVD.   Respiratory: Normal effort.  No stridor.   GI: Non-distended.   Skin: Warm and dry.  Intact. Psych: Normal mood.  Normal behavior. Musc: No edema in extremities.  No tenderness in extremities. 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  1.  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  2.  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 Post stroke dysphagia.    Regular thins with intermittent supervision to initiate therapies to advance diet  4. Essential hypertension.    Controlled today, controlled at home today  5.  Right knee pain             Likely OA             Right knee sleeve/brace for support prn  Controlled  6. Gait abnormality  Continue walker for safety

## 2020-08-06 ENCOUNTER — Telehealth: Payer: Self-pay | Admitting: *Deleted

## 2020-08-06 NOTE — Telephone Encounter (Signed)
Christy ST with [facility]  [facility] called for ST POC 1wk8.  Approval given.

## 2020-08-20 ENCOUNTER — Telehealth: Payer: Self-pay

## 2020-08-20 NOTE — Telephone Encounter (Signed)
Tyler Glass missed speech therapy visit today. Patient had a funeral to attend.

## 2020-08-31 ENCOUNTER — Telehealth: Payer: Self-pay | Admitting: Adult Health

## 2020-08-31 DIAGNOSIS — I82442 Acute embolism and thrombosis of left tibial vein: Secondary | ICD-10-CM

## 2020-08-31 NOTE — Telephone Encounter (Signed)
Eliquis typically continued for 1-months for tx of DVT -initiated during CIR admission 04/2020. Can f/u with CIR or with out office for repeat lower extremity ultrasound around 09/2020 and if resolved, Eliquis will be discontinued and transition back to Plavix

## 2020-08-31 NOTE — Telephone Encounter (Signed)
PT.'s daughter Sharmon Leyden is on [place]. She asks does dad need to continue taking apixaban (ELIQUIS) 5 MG TABS tablet. She states that PCP is waiting to see what Neurologist is saying. Please advise.

## 2020-09-01 NOTE — Addendum Note (Signed)
Addended by: Guy Begin on: 09/01/2020 01:33 PM   Modules accepted: Orders

## 2020-09-01 NOTE — Addendum Note (Signed)
Addended by: Raliegh Ip on: 09/01/2020 03:20 PM   Modules accepted: Orders

## 2020-09-01 NOTE — Telephone Encounter (Signed)
Typically repeat around 6 months which will give adequate time for DVT to resolve.  If they are coming back for the appointment at the end of April, I would recommend trying to align the ultrasound the same day as appointment

## 2020-09-01 NOTE — Telephone Encounter (Signed)
Called daughter, alyah.  Pt transitioning going back to [facility] 09-18-20, will come back for appt 10-14-20, but is asking if can have the [place] prior to leaving to go back to [facility] so as not to have to come back again.  I relayed did not think an issue. Will let her know once spoken to JM/NP.  She appreciated call.

## 2020-09-01 NOTE — Telephone Encounter (Signed)
Can we order and see if can be done same day here 10-14-20?

## 2020-09-08 ENCOUNTER — Telehealth: Payer: Self-pay | Admitting: Adult Health

## 2020-09-08 NOTE — Telephone Encounter (Signed)
Patient wife called me back and her daughter will call me when she gets home. So I can tell her where to go for DVT for her father thanks Annabelle Harman

## 2020-09-09 ENCOUNTER — Telehealth: Payer: Self-pay

## 2020-09-09 NOTE — Telephone Encounter (Signed)
[place] with [facility] will be faxing home health approved orders, for Tyler Glass.  Per [place] orders where faxed before and not signed.   Please sign orders or advise.  Thank you  (Call back contact: Gerogia ph [phone])

## 2020-09-09 NOTE — Telephone Encounter (Signed)
Thank you :)

## 2020-09-15 NOTE — Telephone Encounter (Signed)
Called and Left a message for patient's daughter to call me back . So I can give her directions .    [address] [phone]  Check In at Entrance C  .  See Apt details for date and time - No restrictions arrive 30 mins early .

## 2020-09-24 ENCOUNTER — Encounter: Payer: Medicare HMO | Attending: Physical Medicine & Rehabilitation | Admitting: Physical Medicine & Rehabilitation

## 2020-09-24 DIAGNOSIS — M25561 Pain in right knee: Secondary | ICD-10-CM | POA: Insufficient documentation

## 2020-09-24 DIAGNOSIS — I10 Essential (primary) hypertension: Secondary | ICD-10-CM | POA: Insufficient documentation

## 2020-09-24 DIAGNOSIS — G8929 Other chronic pain: Secondary | ICD-10-CM | POA: Insufficient documentation

## 2020-09-24 DIAGNOSIS — I639 Cerebral infarction, unspecified: Secondary | ICD-10-CM | POA: Insufficient documentation

## 2020-09-24 DIAGNOSIS — R269 Unspecified abnormalities of gait and mobility: Secondary | ICD-10-CM | POA: Insufficient documentation

## 2020-09-24 DIAGNOSIS — I82442 Acute embolism and thrombosis of left tibial vein: Secondary | ICD-10-CM | POA: Insufficient documentation

## 2020-10-01 ENCOUNTER — Ambulatory Visit: Payer: Medicare HMO | Admitting: Physical Medicine & Rehabilitation

## 2020-10-12 ENCOUNTER — Ambulatory Visit (HOSPITAL_COMMUNITY): Payer: Medicare HMO

## 2020-10-14 ENCOUNTER — Encounter: Payer: Self-pay | Admitting: Adult Health

## 2020-10-14 ENCOUNTER — Other Ambulatory Visit: Payer: Self-pay

## 2020-10-14 ENCOUNTER — Ambulatory Visit (HOSPITAL_COMMUNITY)
Admission: RE | Admit: 2020-10-14 | Discharge: 2020-10-14 | Disposition: A | Discharge status: Home / Self Care | Payer: Medicare HMO | Source: Ambulatory Visit | Attending: Adult Health | Admitting: Adult Health

## 2020-10-14 ENCOUNTER — Ambulatory Visit: Payer: Medicare HMO | Admitting: Adult Health

## 2020-10-14 VITALS — BP 120/72 | HR 76 | Ht 73.0 in | Wt 163.0 lb

## 2020-10-14 DIAGNOSIS — I69319 Unspecified symptoms and signs involving cognitive functions following cerebral infarction: Secondary | ICD-10-CM

## 2020-10-14 DIAGNOSIS — I69398 Other sequelae of cerebral infarction: Secondary | ICD-10-CM

## 2020-10-14 DIAGNOSIS — I82442 Acute embolism and thrombosis of left tibial vein: Secondary | ICD-10-CM | POA: Diagnosis present

## 2020-10-14 DIAGNOSIS — I639 Cerebral infarction, unspecified: Secondary | ICD-10-CM | POA: Diagnosis not present

## 2020-10-14 DIAGNOSIS — I10 Essential (primary) hypertension: Secondary | ICD-10-CM | POA: Diagnosis not present

## 2020-10-14 DIAGNOSIS — E785 Hyperlipidemia, unspecified: Secondary | ICD-10-CM

## 2020-10-14 DIAGNOSIS — R269 Unspecified abnormalities of gait and mobility: Secondary | ICD-10-CM

## 2020-10-14 NOTE — Patient Instructions (Addendum)
Would recommend restarting therapies for hopeful further recovery - would recommend driving rehab prior to returning to driving who can formally clear you to drive  Continue Eliquis (apixaban) daily  and atorvastatin  for secondary stroke prevention  Continue to follow up with PCP regarding cholesterol and blood pressure management  Maintain strict control of hypertension with blood pressure goal below 130/90 and cholesterol with LDL cholesterol (bad cholesterol) goal below 70 mg/dL.   Scheduled to get lower extremity ultrasound completed today at 4pm [address] [address] [phone]  Check In at Entrance C       Followup in the future with me in 6 months or call earlier if needed       Thank you for coming to see [place] at [facility]. I hope we have been able to provide you high quality care today.  You may receive a patient satisfaction survey over the next few weeks. We would appreciate your feedback and comments so that we may continue to improve ourselves and the health of our patients.

## 2020-10-14 NOTE — Progress Notes (Signed)
[facility] [address] [place]. [place] [address] [phone]       STROKE FOLLOW UP NOTE  [name] Date of Birth:  08/02/1933 Medical Record Number:  [id number]   Reason for Referral: stroke follow up    SUBJECTIVE:   CHIEF COMPLAINT:  Chief Complaint  Patient presents with  . Follow-up    RM 14 with daughter Tyler Glass) Pt is well and stable, still some R side weakness     HPI:   Today, 10/14/2020, [name] returns for 63-month stroke follow-up accompanied by his daughter  Stable since prior visit without new stroke/TIA symptoms reports residual gait impairment, cognitive impairment, dysarthria and right sided weakness.  He also continues to experience hiccups and frequent vomiting currently being evaluated by GI.  He has since returned back home in [place], [place] with family checking on him frequently. Therapies have not yet been restarted since he returned back there but [facility]. Continues to ambulate with RW and denies any recent falls. He is able to maintain ADLs independently but does need assistance for majority if IADLs. He is interested in returning back to driving although daughter has concerns regarding this. No residual swallowing dificulties and remains on regular diet without difficulty.  Denies new stroke/TIA symptoms  Remains on Eliquis and atorvastatin without associated side effects Blood pressure today 120/72  Daughter questions continued use of Eliquis and repeating lower extremity imaging which has not yet been completed  No further concerns at this time    History provided for reference purposes only Initial visit 06/30/2020 JM: [name] is being seen for hospital follow-up accompanied by his daughter.  He was discharged home from CIR on 05/25/2020 after 24-day stay.  Reports residual gait impairment, dysarthria, dysphagia and intermittent hiccups. Per daughter, hiccups subsided  but restarted on 12/21 and on 12/23 [facility] PT on elevated blood pressure and concern regarding recurrence of hiccups therefore EMS called but patient refused to go to ED. he continued to experience hiccups in addition to nausea and vomiting therefore further evaluated in the ED on 12/25.  Diagnosed with likely viral gastroenteritis and discharged on Reglan for possible benefit on nausea and hiccups with resolution of hiccups after medications.  Daughter reports intermittent hiccups typically after swallowing pills or eating and can be accompanied by vomiting.  He continues to live with his daughter but is eager to return back to his home as he was previously independent in ADLs and IADLs.  He is currently ambulating with rolling walker and continues to work with [facility] PT/OT/SLP. During CIR admission, he was found to have a posterior tibial, peroneal and gastrocnemius vein LLE DVT therefore transition DAPT to Eliquis.  He has remained on Eliquis without bleeding or bruising.  Remains on atorvastatin 80 mg daily without myalgias.  Blood pressure today 125/81.  Stable at home recently but daughter reports initially upon stroke admission discharge, lisinopril was held but since restarting, blood pressure has stabilized.  No further concerns at this time.  Stroke admission 04/27/2020 [name] is a 87 y.o. male with history of HTN  who presented on 04/27/2020 with persistent R sided weakness w/ gait instability, dysphagia and persistent hiccups.  Personally reviewed hospitalization pertinent progress notes, lab work and imaging with summary provided.  Evaluated by Dr. 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  Recommended DAPT for 3 months then Plavix  alone as on aspirin PTA.  History of HTN stable during admission.  History of HLD on atorvastatin 40 mg daily with LDL 118 therefore increase to 80 mg daily.  Other stroke risk factors  include advanced age and EtOH use but no prior stroke history.  Residual deficits of mild right-sided dysmetria, dysphagia and hiccups (placed on Thorazine 12.5 mg 3 times daily).  Evaluated by therapies and discharged to CIR per recommendations for ongoing therapy needs.    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    CT head No acute abnormality.   MRI  R lateral medullary infarct. Mild small vessel disease.   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 Dominant L VA 30-50% stenosis at origin. R VA severe stenosis w/ no flow beyond.  2D Echo EF 60-65%. No source of embolus   LDL 118  HgbA1c 6.0  VTE prophylaxis - Lovenox 40 mg sq daily   aspirin 81 mg daily prior to admission per pt, now on aspirin 325 mg daily. Will decrease aspirin to 81 and add plavix. Continue DAPT x 3 months then plavix alone    Therapy recommendations:  CIR  Disposition: CIR     ROS:   14 system review of systems performed and negative with exception of those listed in HPI  PMH:  Past Medical History:  Diagnosis Date  . Hypertension     PSH: History reviewed. No pertinent surgical history.  Social History:  Social History   Socioeconomic History  . Marital status: Married    Spouse name: Not on file  . Number of children: Not on file  . Years of education: Not on file  . Highest education level: Not on file  Occupational History  . Not on file  Tobacco Use  . Smoking status: Never Smoker  . Smokeless tobacco: Never Used  Vaping Use  . Vaping Use: Never used  Substance and Sexual Activity  . Alcohol use: Not Currently    Comment: occ  . Drug use: Never  . Sexual activity: Not on file  Other Topics Concern  . Not on file  Social History Narrative  . Not on file   Social Determinants of Health   Financial Resource Strain: Not on file  Food Insecurity: Not on file  Transportation Needs: Not on file   Physical Activity: Not on file  Stress: Not on file  Social Connections: Not on file  Intimate Partner Violence: Not on file    Family History: History reviewed. No pertinent family history.  Medications:   Current Outpatient Medications on File Prior to Visit  Medication Sig Dispense Refill  . apixaban (ELIQUIS) 5 MG TABS tablet Take 1 tablet (5 mg total) by mouth 2 (two) times daily. 60 tablet 1  . atorvastatin (LIPITOR) 80 MG tablet Take 1 tablet (80 mg total) by mouth daily. 30 tablet 0  . lisinopril (ZESTRIL) 40 MG tablet Take 40 mg by mouth daily.    . pantoprazole (PROTONIX) 40 MG tablet Take 1 tablet (40 mg total) by mouth daily. 30 tablet 0   No current facility-administered medications on file prior to visit.    Allergies:  No Known Allergies    OBJECTIVE:  Physical Exam  Vitals:   10/14/20 1248  BP: 120/72  Pulse: 76  Weight: 163 lb (73.9 kg)  Height: 6\' 1"  (1.854 m)   Body mass index is 21.51 kg/m. No exam data  present  General: well developed, well nourished,  pleasant elderly African-American male, seated, in no evident distress Head: head normocephalic and atraumatic.   Neck: supple with no carotid or supraclavicular bruits Cardiovascular: regular rate and rhythm, no murmurs Musculoskeletal: no deformity Skin:  no rash/petichiae Vascular:  Normal pulses all extremities   Neurologic Exam Mental Status: Awake and fully alert. Mild dysarthria.  No evidence of aphasia.  Oriented to place and time. Recent memory impaired and remote memory intact. Attention span, concentration and fund of knowledge impaired as he would frequently speak off topic. Mood and affect appropriate.  Recall 0/3.  Difficulty with serial addition.  Able to name 5 4-legged animals in 60 seconds. Cranial Nerves: Pupils equal, briskly reactive to light. Extraocular movements full without nystagmus. Visual fields full to confrontation.  HOH bilaterally. Facial sensation intact.  Mild left  lower facial weakness.  Tongue, palate moves normally and symmetrically.  Motor: Normal bulk and tone. Normal strength in all tested extremity muscles except slight right hip flexor and ankle dorsiflexion weakness Sensory.: intact to touch , pinprick , position and vibratory sensation except reports hyper sensation RLE compared to LLE Coordination: Rapid alternating movements normal in all extremities. Finger-to-nose and heel-to-shin performed shows mild dysmetria right side. Gait and Station: Stands from seated position without difficulty.  Stance is normal.  Gait demonstrates normal stride length with mild imbalance and use of Rollator walker Reflexes: 1+ and symmetric. Toes downgoing.         ASSESSMENT: Tyler Glass is a 87 y.o. 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  Found to have LLE DVT during CIR admission and placed on Eliquis.  Vascular risk factors include bilateral VA stenosis, HTN, HLD, advanced age and EtOH use.     PLAN:  1. Right lateral medullary stroke:  a. Residual deficit: Dysarthria, RLE weakness with hyper sensory, cognitive impairment, gait impairment and continued hiccups i. Highly recommend restarting therapies as he is currently residing in [place], [place] for residual deficits and hopeful further recovery.  Discussed importance of Rollator walker at all times for fall prevention ii. Concern for return to driving with residual mild RLE weakness as well as cognitive impairment -recommend formal driving eval/rehab for final determination iii. Continued hiccups with frequent emesis currently being evaluated by GI (unable to view via epic) b. Continue Eliquis (apixaban) daily  and atorvastatin 80 mg daily for secondary stroke prevention.    c. Discussed secondary stroke prevention measures and  importance of close PCP follow up for aggressive stroke risk factor management  2. LLE DVT: Posterior tibial peroneal and gastrocnemius 04/2020. Was able to schedule patient today at 4 PM for repeat lower extremity Doppler.  Continue Eliquis and after Doppler completed, if DVT resolved, may transition to aspirin 81 mg daily for secondary stroke prevention 3. HTN: BP goal <130/90.  Stable on lisinopril 40 mg daily per PCP 4. HLD: LDL goal <70.  On atorvastatin 80 mg daily managed by PCP. Per daughter, recent lipid panel completed by PCP which was satisfactory     Follow up in 6 months or call earlier if needed   CC:  [facility] provider: Dr. Wendie Simmer, Paula Compton, MD     Tyler Glass, [facility]  [facility] [address] [place], [place] [address]  Phone [phone] Fax [phone] Note: This document was prepared with digital dictation  and possible smart Company secretary. Any transcriptional errors that result from this process are unintentional.

## 2020-10-14 NOTE — Progress Notes (Signed)
BLE venous duplex has been completed.  Results can be found under chart review under CV PROC. 10/14/2020 5:32 PM Christ Fullenwider RVT, RDMS

## 2020-10-15 ENCOUNTER — Telehealth: Payer: Self-pay | Admitting: Adult Health

## 2020-10-15 MED ORDER — ASPIRIN EC 81 MG PO TBEC: 81.0000 mg | DELAYED_RELEASE_TABLET | Freq: Every day | ORAL | 11 refills | Status: AC

## 2020-10-15 NOTE — Telephone Encounter (Signed)
Acute LLE DVT initially discovered 04/2020 and placed on Eliquis.  Repeat lower extremity Doppler showed resolution of prior DVT.  Advised to discontinue Eliquis as no longer indicated and start aspirin 81 mg daily for secondary stroke prevention.  Contacted daughter updating her on these results and further instructions as above.  No further questions and appreciative of phone call.

## 2020-10-15 NOTE — Progress Notes (Signed)
I agree with the above plan 

## 2021-03-07 IMAGING — CT CT ABD-PELV W/ CM
2 of 5 series · 15 of 46 positions shown, 17 images · IV contrast (Omnipaque)
Comparison: None.

CLINICAL DATA: Will distension.

EXAM:
CT ABDOMEN AND PELVIS WITH CONTRAST
TECHNIQUE: Multidetector CT imaging of the abdomen and pelvis was performed
using the standard protocol following bolus administration of
intravenous contrast.
CONTRAST:  100mL OMNIPAQUE IOHEXOL 300 MG/ML  SOLN

[Series 2: axial st · axial · 0.71mm/px · z∈[-641,-131]mm · 12 of 114 slices shown, 14 images]
[im 6/114  soft-tissue]
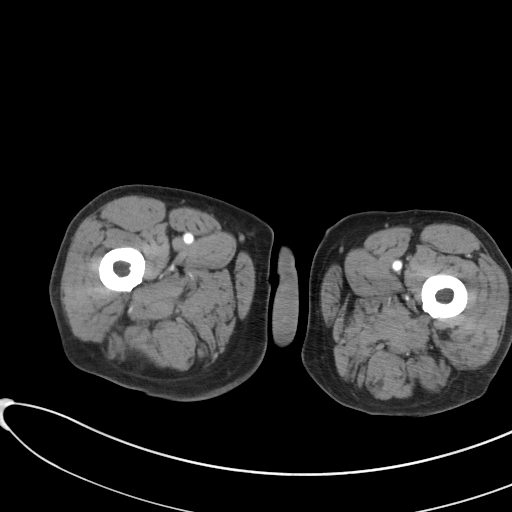
[im 6/114  bone]
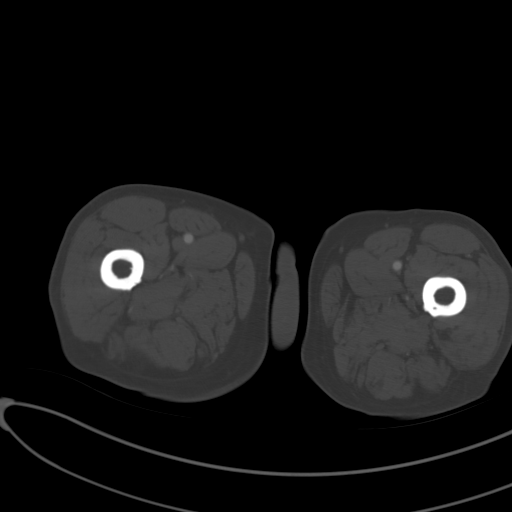
[im 17/114  soft-tissue]
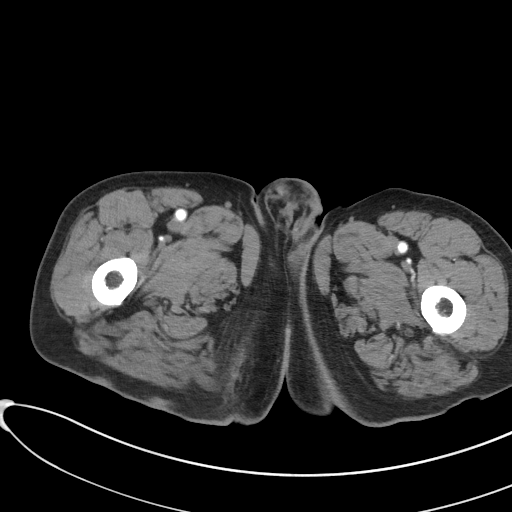
[im 23/114  soft-tissue]
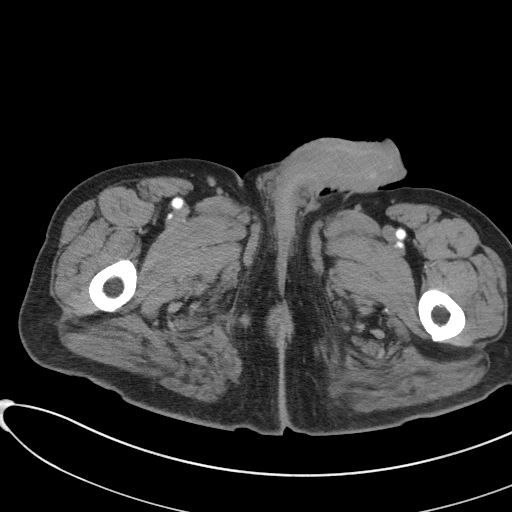
[im 34/114  soft-tissue]
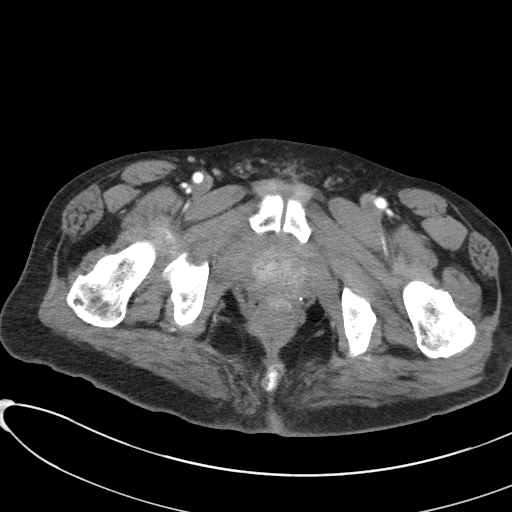
[im 46/114  soft-tissue]
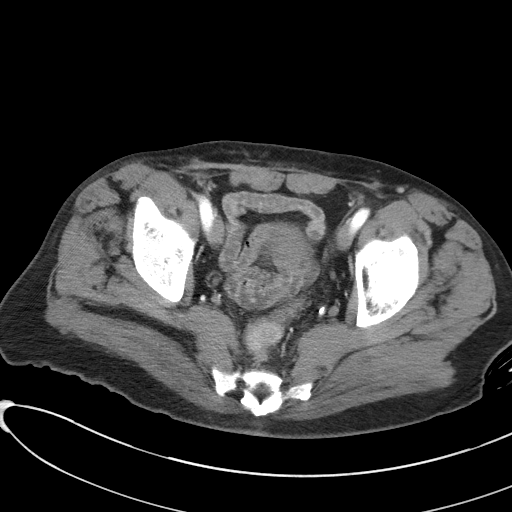
[im 51/114  soft-tissue]
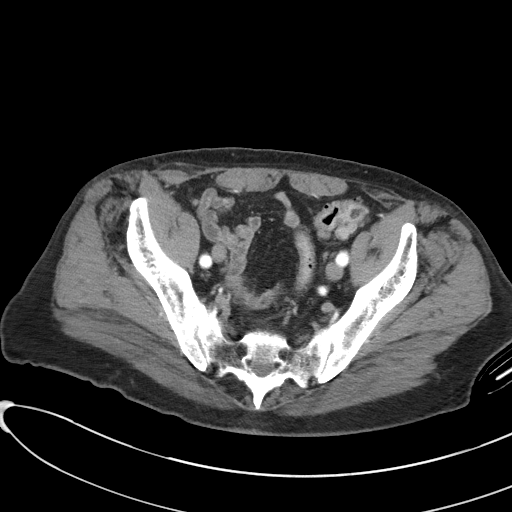
[im 63/114  soft-tissue]
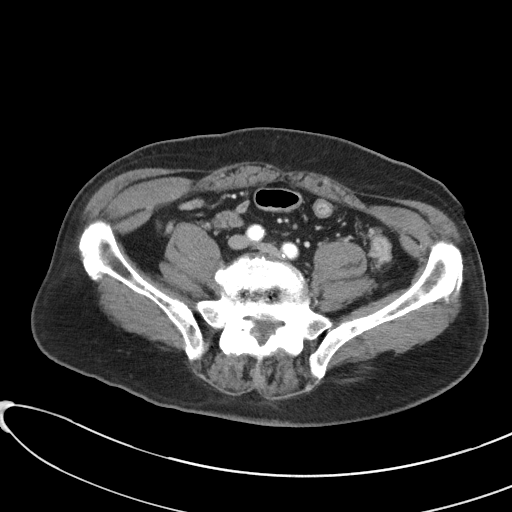
[im 68/114  soft-tissue]
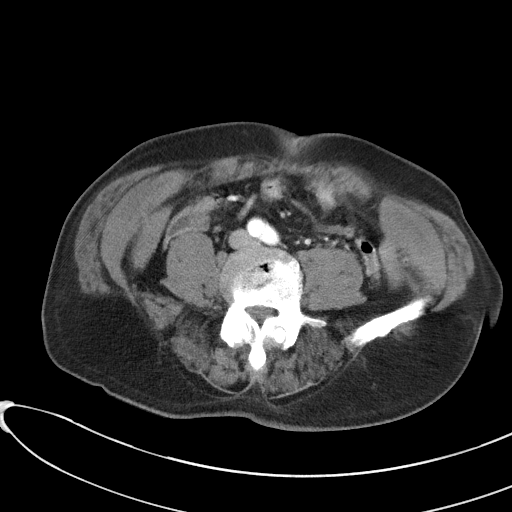
[im 80/114  soft-tissue]
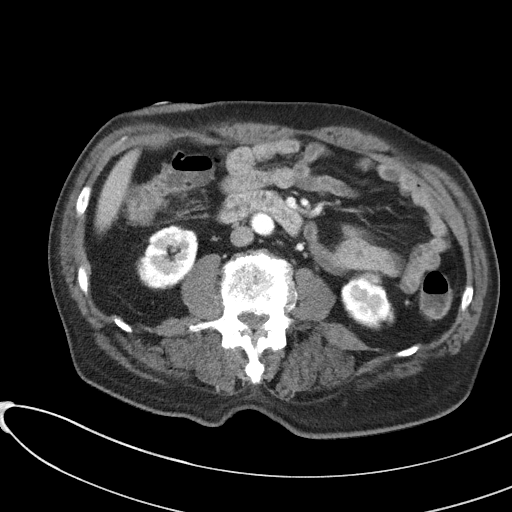
[im 80/114  bone]
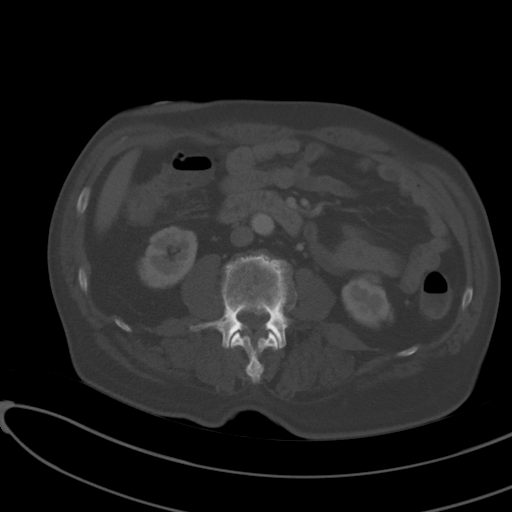
[im 91/114  soft-tissue]
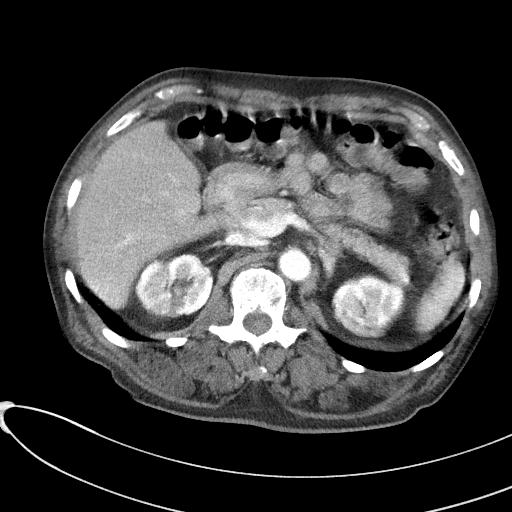
[im 97/114  soft-tissue]
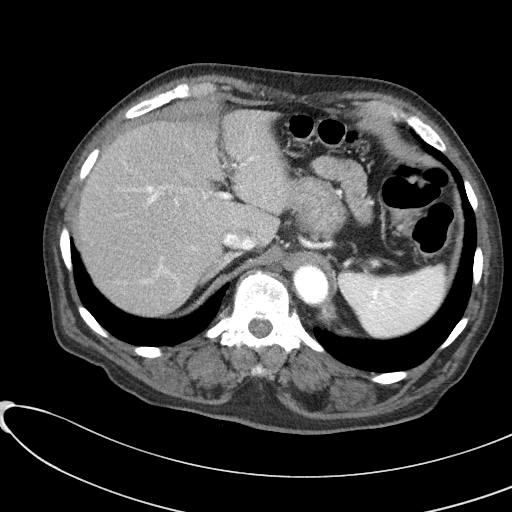
[im 108/114  soft-tissue]
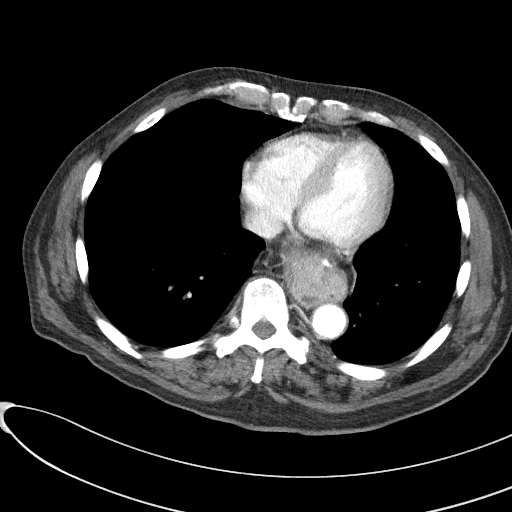

[Series 4: coronal st · coronal · 0.85mm/px · 3 of 92 slices shown]
[im 31/92  soft-tissue]
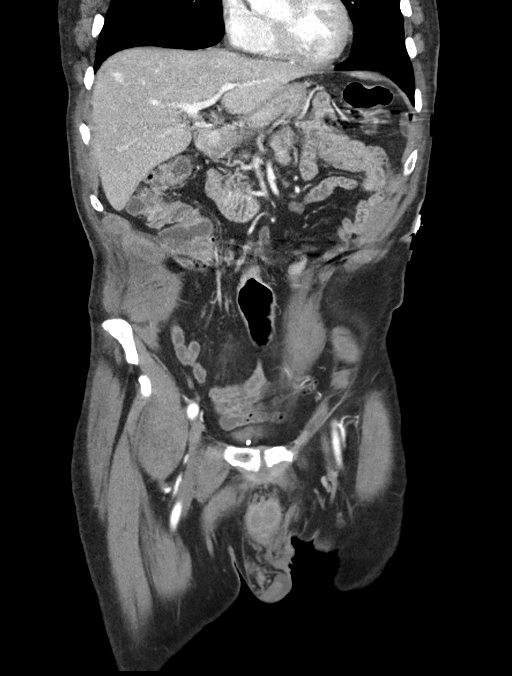
[im 41/92  soft-tissue]
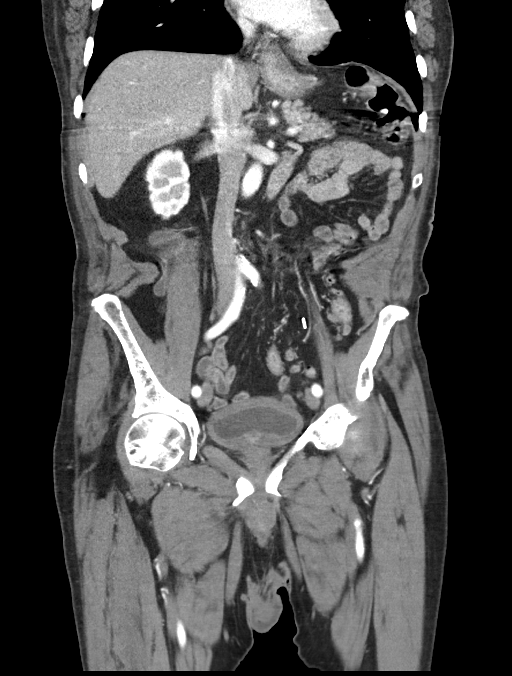
[im 51/92  soft-tissue]
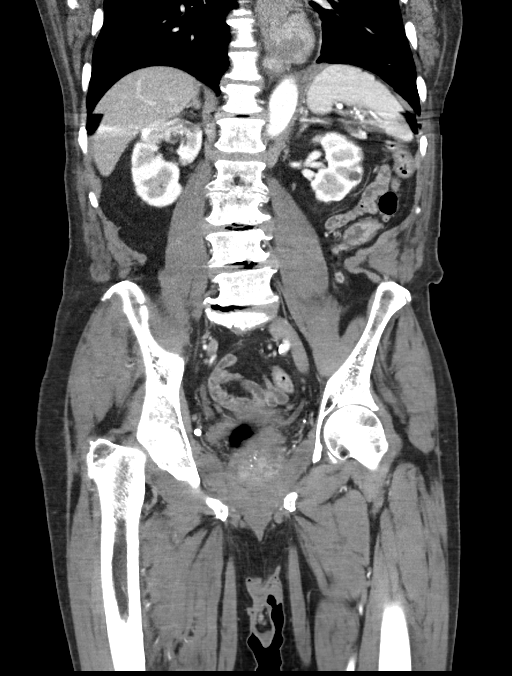

[15 of 46 positions shown; findings below may reference images not displayed]

FINDINGS: Lower chest: The lung bases are clear. The heart size is normal.

Hepatobiliary: The liver is normal. Status post
cholecystectomy.There is no biliary ductal dilation.

Pancreas: Normal contours without ductal dilatation. No
peripancreatic fluid collection.

Spleen: Unremarkable.

Adrenals/Urinary Tract:

--Adrenal glands: Unremarkable.

--Right kidney/ureter: There are multiple low-attenuation areas in
the cortex of the right kidney suggestive of small cysts.
Additionally, there are areas of cortical thinning consistent with
scarring.

--Left kidney/ureter: Multiple cysts are noted involving the left
kidney. There are areas of cortical thinning and scarring.

--Urinary bladder: There is diffuse bladder wall thickening.

Stomach/Bowel:

--Stomach/Duodenum: There is a moderate-sized hiatal hernia.

--Small bowel: Unremarkable.

--Colon: There is scattered colonic diverticula without definite
evidence for diverticulitis. There is liquid stool throughout the
colon. There are scattered areas of hyperenhancement of the colon
for example at the rectum, sigmoid colon, and hepatic flexure.

--Appendix: Normal.

Vascular/Lymphatic: Atherosclerotic calcification is present within
the non-aneurysmal abdominal aorta, without hemodynamically
significant stenosis. There are questionable filling defects within
the bilateral common femoral veins, left greater than right (axial
series 2, image 78).

--No retroperitoneal lymphadenopathy.

--No mesenteric lymphadenopathy.

--No pelvic or inguinal lymphadenopathy.

Reproductive: There is significant prostatomegaly.

Other: No ascites or free air. The abdominal wall is normal.

Musculoskeletal. There are advanced multilevel degenerative changes
throughout the lumbar spine. There is no evidence for an acute
compression fracture.
IMPRESSION: 1. Liquid stool throughout the colon consistent with diarrhea. There
are scattered areas of hyperenhancement of the colon, which may be
secondary to an infectious or inflammatory colitis.
2. Questionable filling defects within the bilateral common femoral
veins, left greater than right. While this may be secondary to
mixing artifact, correlation with ultrasound is recommended.
3. Prostatomegaly with diffuse bladder wall thickening. Findings may
be secondary to chronic outlet obstruction.
4. Moderate-sized hiatal hernia.

Aortic Atherosclerosis (BPA0E-JEZ.Z).

## 2021-03-07 IMAGING — US US EXTREM LOW VENOUS*L*
1 series · 13 of 24 positions shown · non-contrast
Comparison: CT Abdomen and Pelvis 1842 hours today. Pelvis MRI
05/11/2020.

CLINICAL DATA: 86-year-old male with left calf DVT last month.
Questionable filling defects in bilateral common femoral veins on CT
Abdomen and Pelvis tonight.

EXAM:
LEFT LOWER EXTREMITY VENOUS DOPPLER ULTRASOUND
TECHNIQUE: Gray-scale sonography with compression, as well as color and duplex
ultrasound, were performed to evaluate the deep venous system(s)
from the level of the common femoral vein through the popliteal and
proximal calf veins.

[Series 1: us extrem low venous*left* · 13 of 61 slices shown]
[im 1/61]
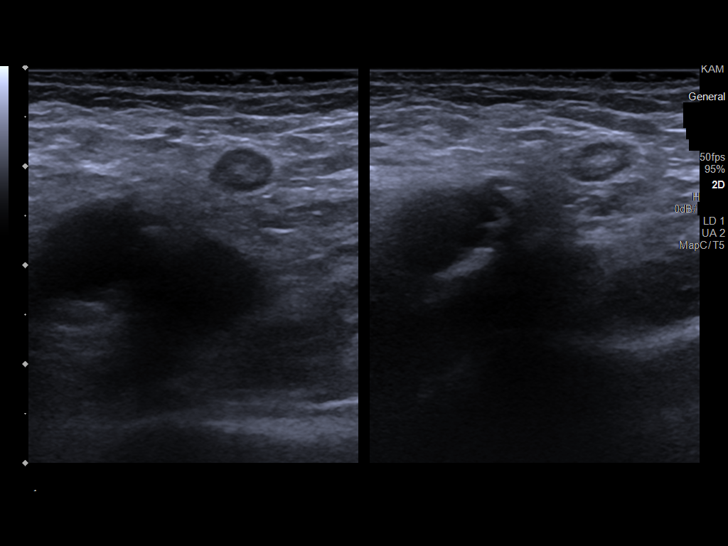
[im 6/61]
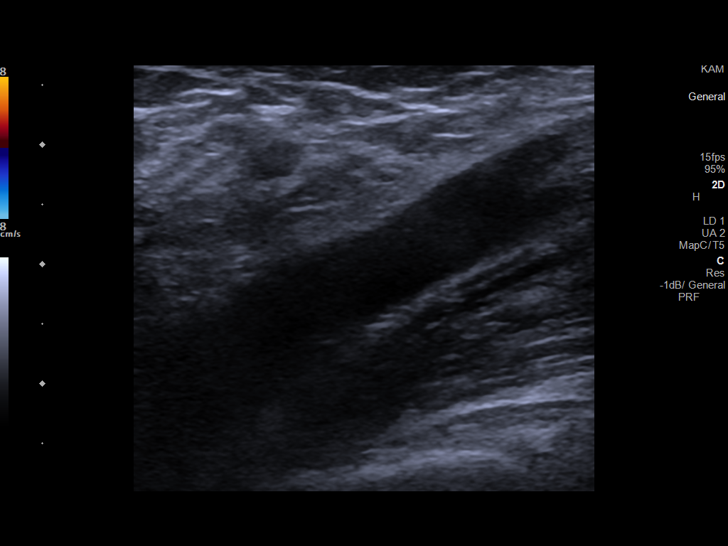
[im 11/61]
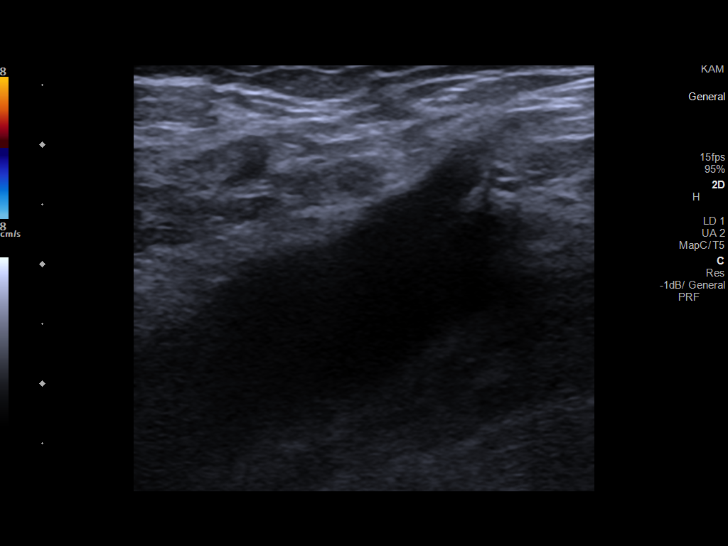
[im 16/61]
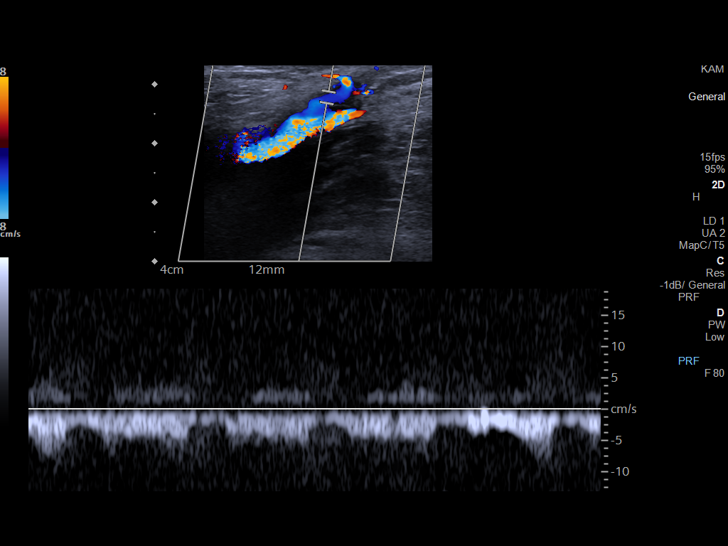
[im 21/61]
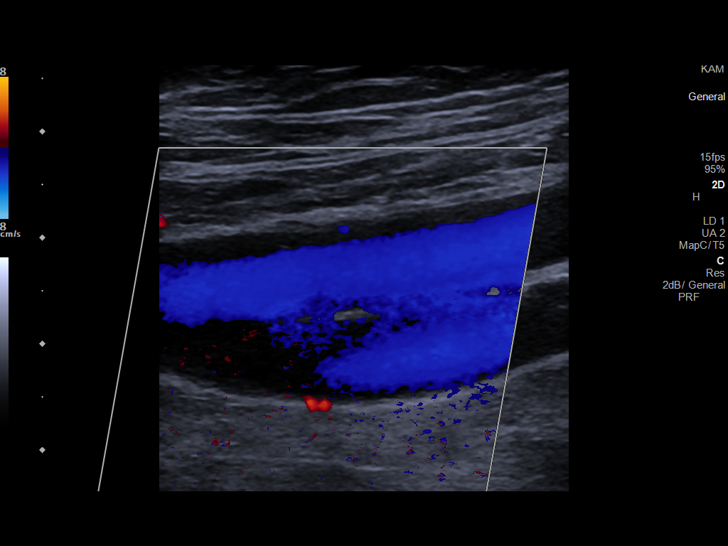
[im 27/61]
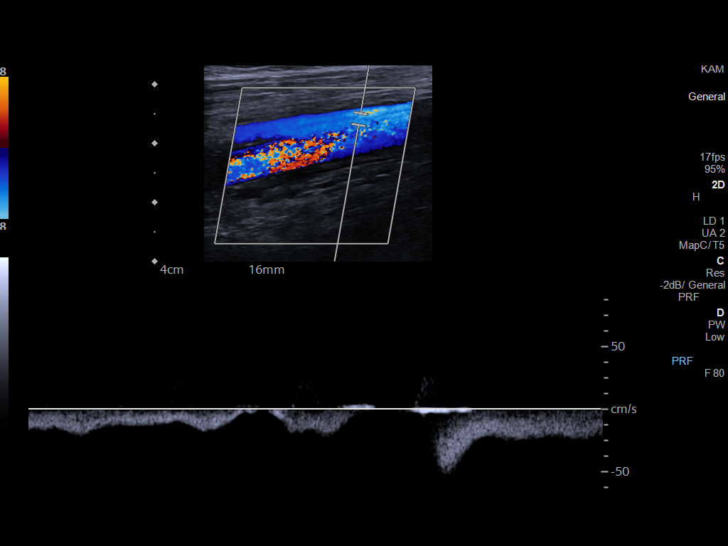
[im 32/61]
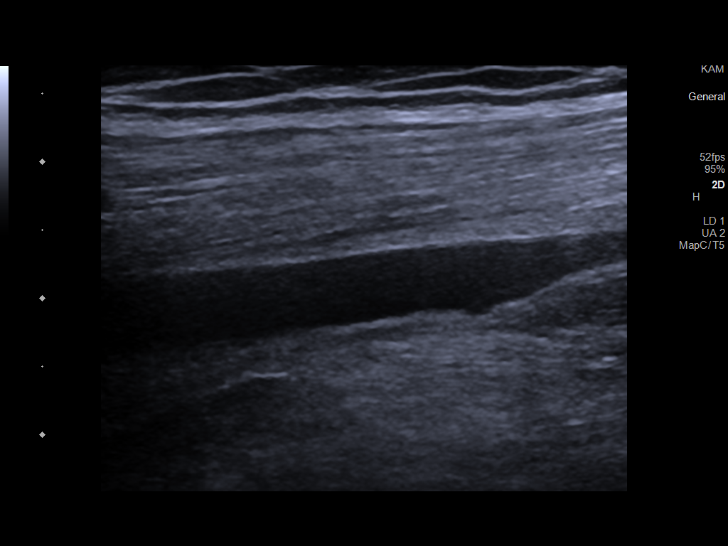
[im 34/61]
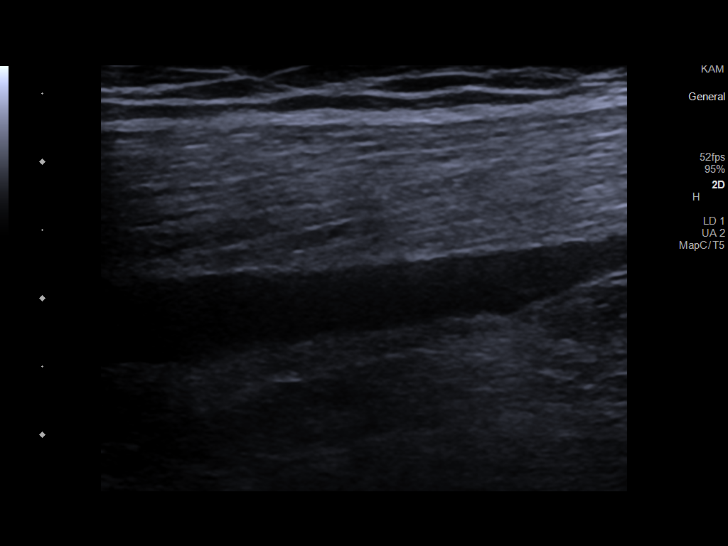
[im 40/61]
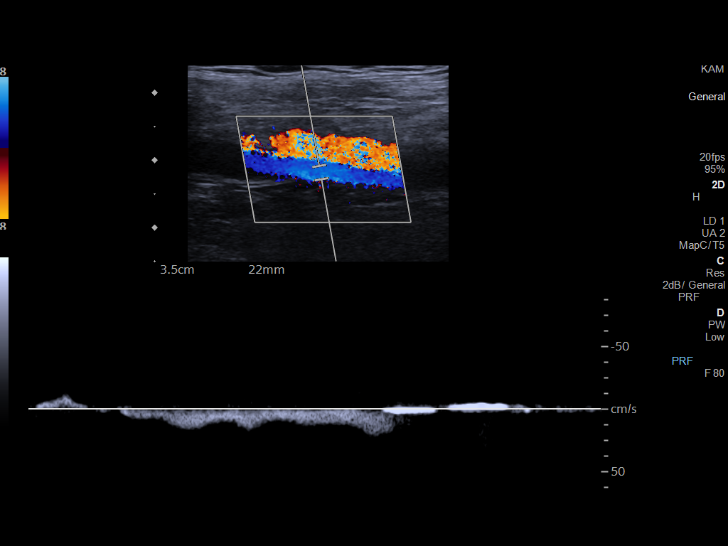
[im 45/61]
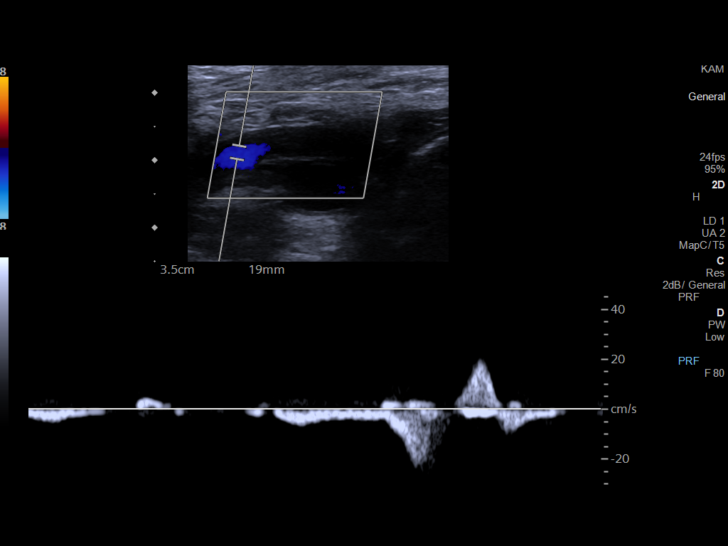
[im 50/61]
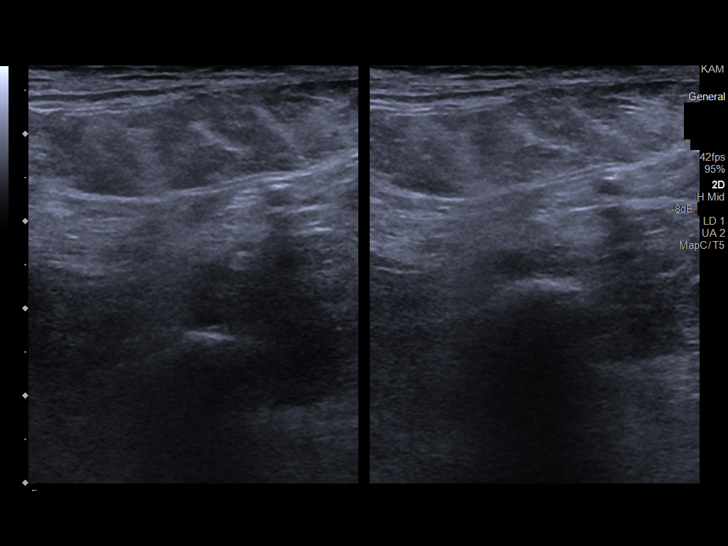
[im 55/61]
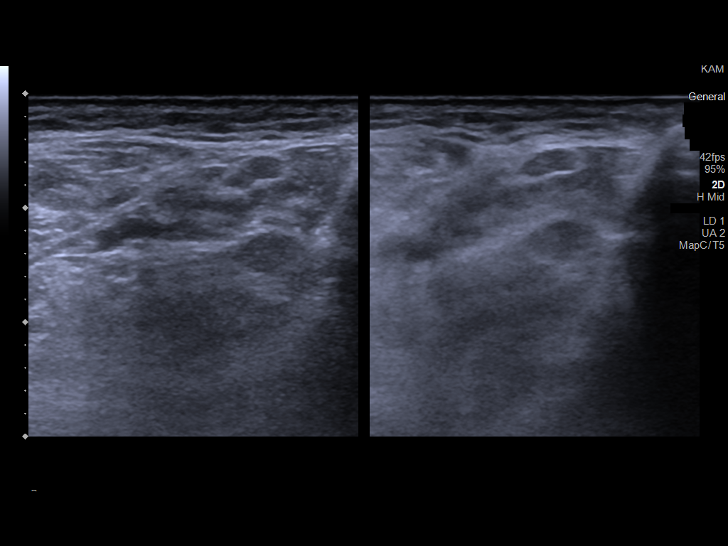
[im 61/61]
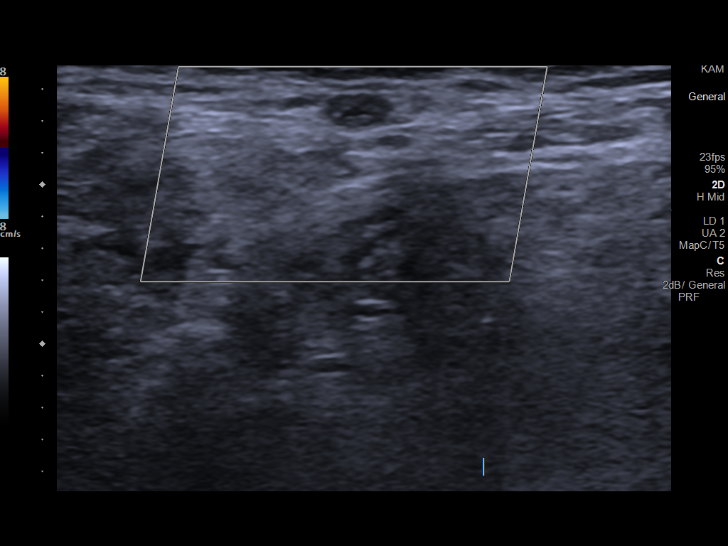

[13 of 24 positions shown; findings below may reference images not displayed]

FINDINGS: VENOUS

Normal compressibility of the common femoral, superficial femoral,
and popliteal veins. Visualized portions of profunda femoral vein
and great saphenous vein unremarkable.

Evidence of patent left lower extremity deep venous system until the
calf, where a single incompressible calf vein appears partially
thrombosed (images 52 and 53). Furthermore, the distal superficial
saphenous vein also appears incompressible and thrombosed (images 61
and 62).

No other filling defects to suggest DVT on grayscale or color
Doppler imaging. Doppler waveforms show normal direction of venous
flow, normal respiratory plasticity and response to augmentation.

Limited views of the contralateral common femoral vein are
unremarkable.

OTHER

None.

Limitations: none
IMPRESSION: 1. Positive for partially thrombosed single left lower extremity
calf vein, and nearby distal left saphenous superficial venous
thrombosis.

2. Negative for other left lower extremity DVT. Bilateral common
femoral veins are patent.

## 2021-04-19 ENCOUNTER — Ambulatory Visit: Payer: Medicare HMO | Admitting: Adult Health

## 2021-04-19 ENCOUNTER — Encounter: Payer: Self-pay | Admitting: Adult Health

## 2021-04-19 VITALS — BP 104/67 | HR 70 | Ht 72.0 in | Wt 147.0 lb

## 2021-04-19 DIAGNOSIS — E785 Hyperlipidemia, unspecified: Secondary | ICD-10-CM | POA: Diagnosis not present

## 2021-04-19 DIAGNOSIS — I639 Cerebral infarction, unspecified: Secondary | ICD-10-CM | POA: Diagnosis not present

## 2021-04-19 DIAGNOSIS — I10 Essential (primary) hypertension: Secondary | ICD-10-CM | POA: Diagnosis not present

## 2021-04-19 NOTE — Progress Notes (Signed)
[facility] [address] [place]. [place] [address] [phone]       STROKE FOLLOW UP NOTE  [name] Date of Birth:  November 24, 1933 Medical Record Number:  [id number]   Reason for Referral: stroke follow up    SUBJECTIVE:   CHIEF COMPLAINT:  Chief Complaint  Patient presents with   Follow-up    RM 3 with daughter Tyler Glass  Pt is well and stable, no new concerns     HPI:   Update 04/19/2021 JM: Returns for 57-month stroke follow-up accompanied by his daughter.  Residual deficits: Gait impairment -gradual improvement.  Currently using of cane. Denies any recent falls. Keeping busy at home - exercising as tolerated.  Right sided weakness - great improvement - still mild weakness Cognition - fluctuates per daughter. Improvement since prior visit. Continues to live alone.  Neighbors check on him routinely. Has been cooking simple meals without difficulty. Has returned back to driving short distance only without difficulty. Hiccups - on occasion. Eval by GI - has been making dietary changes which has been helpful  Repeat LE ultrasound 09/2020 showed resolution of prior DVT therefore Eliquis discontinued and transition to aspirin Remains on aspirin 81 mg daily and Lipitor 80 mg daily- denies side effects Blood pressure today 104/67 PCP routinely does lab work - has been good per daughter (unable to view via epic) Does have f/u coming up soon with PCP  No new concerns at this time     History provided for reference purposes only Update 10/14/2020 JM: Tyler Glass returns for 80-month stroke follow-up accompanied by his daughter  Stable since prior visit without new stroke/TIA symptoms reports residual gait impairment, cognitive impairment, dysarthria and right sided weakness.  He also continues to experience hiccups and frequent vomiting currently being evaluated by GI.  He has since returned back home in [place], [place] with family checking on him frequently.  Therapies have not yet been restarted since he returned back there but [facility]. Continues to ambulate with RW and denies any recent falls. He is able to maintain ADLs independently but does need assistance for majority if IADLs. He is interested in returning back to driving although daughter has concerns regarding this. No residual swallowing dificulties and remains on regular diet without difficulty.  Denies new stroke/TIA symptoms  Remains on Eliquis and atorvastatin without associated side effects Blood pressure today 120/72  Daughter questions continued use of Eliquis and repeating lower extremity imaging which has not yet been completed  No further concerns at this time  Initial visit 06/30/2020 JM: Tyler Glass is being seen for hospital follow-up accompanied by his daughter.  He was discharged home from CIR on 05/25/2020 after 24-day stay.  Reports residual gait impairment, dysarthria, dysphagia and intermittent hiccups. Per daughter, hiccups subsided but restarted on 12/21 and on 12/23 [facility] PT on elevated blood pressure and concern regarding recurrence of hiccups therefore EMS called but patient refused to go to ED. he continued to experience hiccups in addition to nausea and vomiting therefore further evaluated in the ED on 12/25.  Diagnosed with likely viral gastroenteritis and discharged on Reglan for possible benefit on nausea and hiccups with resolution of hiccups after medications.  Daughter reports intermittent hiccups typically after swallowing pills or eating and can be accompanied by vomiting.  He continues to live with his daughter but is eager to return back to his home as he was previously independent in ADLs and IADLs.  He is currently ambulating with rolling walker and continues to work with [facility] PT/OT/SLP. During CIR admission, he was found to have a posterior tibial, peroneal and gastrocnemius vein LLE DVT therefore transition DAPT to  Eliquis.  He has remained on Eliquis without bleeding or bruising.  Remains on atorvastatin 80 mg daily without myalgias.  Blood pressure today 125/81.  Stable at home recently but daughter reports initially upon stroke admission discharge, lisinopril was held but since restarting, blood pressure has stabilized.  No further concerns at this time.  Stroke admission 04/27/2020 Tyler Glass is a 87 y.o. male with history of HTN  who presented on 04/27/2020 with persistent R sided weakness w/ gait instability, dysphagia and persistent hiccups.  Personally reviewed hospitalization pertinent progress notes, lab work and imaging with summary provided.  Evaluated by Dr. 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  Recommended DAPT for 3 months then Plavix alone as on aspirin PTA.  History of HTN stable during admission.  History of HLD on atorvastatin 40 mg daily with LDL 118 therefore increase to 80 mg daily.  Other stroke risk factors include advanced age and EtOH use but no prior stroke history.  Residual deficits of mild right-sided dysmetria, dysphagia and hiccups (placed on Thorazine 12.5 mg 3 times daily).  Evaluated by therapies and discharged to CIR per recommendations for ongoing therapy needs.   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   CT head No acute abnormality.  MRI  R lateral medullary infarct. Mild small vessel disease.  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 Dominant L VA 30-50% stenosis at origin. R VA severe stenosis w/ no flow beyond. 2D Echo EF 60-65%. No source of embolus  LDL 118 HgbA1c 6.0 VTE prophylaxis - Lovenox 40 mg sq daily  aspirin 81 mg daily prior to admission per pt, now on aspirin 325 mg daily. Will decrease aspirin to 81 and add plavix. Continue DAPT x 3 months then plavix alone   Therapy  recommendations:  CIR Disposition: CIR     ROS:   14 system review of systems performed and negative with exception of those listed in HPI  PMH:  Past Medical History:  Diagnosis Date   Hypertension     PSH: History reviewed. No pertinent surgical history.  Social History:  Social History   Socioeconomic History   Marital status: Married    Spouse name: Not on file   Number of children: Not on file   Years of education: Not on file   Highest education level: Not on file  Occupational History   Not on file  Tobacco Use   Smoking status: Never   Smokeless tobacco: Never  Vaping Use   Vaping Use: Never used  Substance and Sexual Activity   Alcohol use: Not Currently    Comment: occ   Drug use: Never   Sexual activity: Not on file  Other Topics Concern   Not on file  Social History Narrative   Not on file   Social Determinants of Health   Financial Resource Strain: Not on file  Food Insecurity: Not on file  Transportation Needs: Not on file  Physical Activity: Not on file  Stress: Not on file  Social Connections: Not on file  Intimate Partner Violence: Not on file    Family History: History reviewed. No pertinent family history.  Medications:   Current Outpatient Medications on File Prior to Visit  Medication Sig Dispense Refill   aspirin EC 81 MG tablet Take 1 tablet (81 mg total) by mouth daily. Swallow whole. 30 tablet 11   atorvastatin (LIPITOR) 80 MG tablet Take 1 tablet (80 mg total) by mouth daily. 30 tablet 0   lisinopril (ZESTRIL) 40 MG tablet Take 40 mg by mouth daily.     No current facility-administered medications on file prior to visit.    Allergies:  No Known Allergies    OBJECTIVE:  Physical Exam  Vitals:   04/19/21 1238  BP: 104/67  Pulse: 70  Weight: 147 lb (66.7 kg)  Height: 6' (1.829 m)   Body mass index is 19.94 kg/m. No results found.  General: well developed, well nourished,  pleasant elderly African-American  male, seated, in no evident distress Head: head normocephalic and atraumatic.   Neck: supple with no carotid or supraclavicular bruits Cardiovascular: regular rate and rhythm, no murmurs Musculoskeletal: no deformity Skin:  no rash/petichiae Vascular:  Normal pulses all extremities   Neurologic Exam Mental Status: Awake and fully alert. Mild dysarthria.  No evidence of aphasia.  Oriented to place and time. Recent memory impaired and remote memory intact. Attention span, concentration and fund of knowledge appropriate during visit. Mood and affect appropriate.  Cranial Nerves: Pupils equal, briskly reactive to light. Extraocular movements full without nystagmus. Visual fields full to confrontation.  HOH bilaterally. Facial sensation intact.  Face, tongue, palate moves normally and symmetrically.  Motor: Normal bulk and tone. Normal strength in all tested extremity muscles  Sensory.: intact to touch , pinprick , position and vibratory sensation in all tested extremities  Coordination: Rapid alternating movements normal in all extremities. Finger-to-nose and heel-to-shin performed shows mild dysmetria leg > arm right side. Gait and Station: Stands from seated position without difficulty.  Stance is normal.  Gait demonstrates normal stride length and mild stiffened right leg with use of cane.  Tandem walk and heel toe not attempted Reflexes: 1+ and symmetric. Toes downgoing.         ASSESSMENT: Tyler Glass is a 87 y.o. 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  Found to have LLE DVT during CIR admission and placed on Eliquis since completed with repeat ultrasound showing resolution.  Vascular risk factors include bilateral VA stenosis, HTN, HLD, advanced age and EtOH use.     PLAN:  Right lateral medullary stroke:   Residual deficit: Dysarthria, right-sided dysmetria, mild cognitive impairment, gait impairment and hiccups - improvement noted since prior visit Continued use of cane at all times for fall prevention Continued memory exercises and importance of managing stroke risk factors, healthy diet, routine exercise and adequate sleep Continue aspirin 81 mg daily  and atorvastatin 80 mg daily for secondary stroke prevention.    Discussed secondary stroke prevention measures and importance of close PCP follow up for aggressive stroke risk factor management  HTN: BP goal <130/90.  Stable on lisinopril 40 mg daily per PCP HLD: LDL goal <70.  On atorvastatin 80 mg daily managed by PCP and routine lipid panel monitoring per PCP    Overall stable from stroke standpoint.  Follow-up on an as-needed basis   CC:  Caffie Damme, MD    I spent 32 minutes of face-to-face and non-face-to-face time with patient and daughter.  This included  previsit chart review, lab review, study review, electronic health record documentation, patient and daughter education and discussion regarding prior stroke and residual deficits, secondary stroke prevention measures and aggressive stroke risk factor management and answered all other questions to patient satisfaction    Ihor Austin, [facility]  [facility] [address] [place], [place] [address]  Phone [phone] Fax [phone] Note: This document was prepared with digital dictation and possible smart phrase technology. Any transcriptional errors that result from this process are unintentional.

## 2021-04-19 NOTE — Patient Instructions (Signed)
Continue aspirin 81 mg daily  and atorvastatin 80mg  daily  for secondary stroke prevention  Continue to follow up with PCP regarding cholesterol and blood pressure management  Maintain strict control of hypertension with blood pressure goal below 130/90 and cholesterol with LDL cholesterol (bad cholesterol) goal below 70 mg/dL.        Thank you for coming to see at [facility]. I hope we have been able to provide you high quality care today.  You may receive a patient satisfaction survey over the next few weeks. We would appreciate your feedback and comments so that we may continue to improve ourselves and the health of our patients.
